# Patient Record
Sex: Male | Born: 1965 | Race: White | Hispanic: No | Marital: Single | Smoking: Current every day smoker
Health system: Southern US, Community
[De-identification: ages and names within clinical notes are randomized; demographics above are authoritative.]

## PROBLEM LIST (undated history)

## (undated) DIAGNOSIS — R7989 Other specified abnormal findings of blood chemistry: Secondary | ICD-10-CM

## (undated) DIAGNOSIS — F419 Anxiety disorder, unspecified: Secondary | ICD-10-CM

## (undated) DIAGNOSIS — R51 Headache: Secondary | ICD-10-CM

## (undated) DIAGNOSIS — E559 Vitamin D deficiency, unspecified: Secondary | ICD-10-CM

## (undated) DIAGNOSIS — E785 Hyperlipidemia, unspecified: Secondary | ICD-10-CM

## (undated) DIAGNOSIS — L309 Dermatitis, unspecified: Secondary | ICD-10-CM

## (undated) DIAGNOSIS — K5792 Diverticulitis of intestine, part unspecified, without perforation or abscess without bleeding: Secondary | ICD-10-CM

## (undated) HISTORY — PX: NOSE SURGERY: SHX723

## (undated) HISTORY — DX: Headache: R51

## (undated) HISTORY — DX: Vitamin D deficiency, unspecified: E55.9

## (undated) HISTORY — DX: Dermatitis, unspecified: L30.9

## (undated) HISTORY — DX: Hyperlipidemia, unspecified: E78.5

## (undated) HISTORY — DX: Other specified abnormal findings of blood chemistry: R79.89

---

## 2009-05-19 ENCOUNTER — Ambulatory Visit: Payer: Self-pay | Admitting: Diagnostic Radiology

## 2009-05-19 ENCOUNTER — Emergency Department (HOSPITAL_BASED_OUTPATIENT_CLINIC_OR_DEPARTMENT_OTHER): Admission: EM | Admit: 2009-05-19 | Discharge: 2009-05-19 | Payer: Self-pay | Admitting: Emergency Medicine

## 2011-01-16 LAB — BASIC METABOLIC PANEL
BUN: 12 mg/dL (ref 6–23)
CO2: 29 mEq/L (ref 19–32)
Calcium: 9.2 mg/dL (ref 8.4–10.5)
Chloride: 102 mEq/L (ref 96–112)
Creatinine, Ser: 0.9 mg/dL (ref 0.4–1.5)
GFR calc Af Amer: >60 mL/min (ref >60)
GFR calc non Af Amer: >60 mL/min (ref >60)
Glucose, Bld: 100 mg/dL — ABNORMAL HIGH (ref 70–99)
Potassium: 3.9 mEq/L (ref 3.5–5.1)
Sodium: 139 mEq/L (ref 135–145)

## 2011-01-16 LAB — CBC
Hemoglobin: 15.6 g/dL (ref 13.0–17.0)
MCHC: 35.3 g/dL (ref 30.0–36.0)
RBC: 4.78 MIL/uL (ref 4.22–5.81)
WBC: 7.5 10*3/uL (ref 4.0–10.5)

## 2011-02-24 ENCOUNTER — Other Ambulatory Visit: Payer: Self-pay | Admitting: Gastroenterology

## 2011-02-24 DIAGNOSIS — R1032 Left lower quadrant pain: Secondary | ICD-10-CM

## 2011-03-03 ENCOUNTER — Ambulatory Visit
Admission: RE | Admit: 2011-03-03 | Discharge: 2011-03-03 | Disposition: A | Discharge status: Home / Self Care | Payer: BC Managed Care – PPO | Source: Ambulatory Visit | Attending: Gastroenterology | Admitting: Gastroenterology

## 2011-03-03 DIAGNOSIS — R1032 Left lower quadrant pain: Secondary | ICD-10-CM

## 2011-03-03 MED ORDER — IOHEXOL 300 MG/ML  SOLN
100.0000 mL | Freq: Once | INTRAMUSCULAR | Status: AC | PRN
  Administered 2011-03-03: 125 mL via INTRAVENOUS

## 2014-10-21 ENCOUNTER — Other Ambulatory Visit: Payer: Self-pay | Admitting: Gastroenterology

## 2014-10-21 DIAGNOSIS — R1032 Left lower quadrant pain: Secondary | ICD-10-CM

## 2014-10-23 ENCOUNTER — Ambulatory Visit
Admission: RE | Admit: 2014-10-23 | Discharge: 2014-10-23 | Disposition: A | Discharge status: Home / Self Care | Payer: BLUE CROSS/BLUE SHIELD | Source: Ambulatory Visit | Attending: Gastroenterology | Admitting: Gastroenterology

## 2014-10-23 DIAGNOSIS — R1032 Left lower quadrant pain: Secondary | ICD-10-CM

## 2014-10-23 MED ORDER — IOHEXOL 300 MG/ML  SOLN
125.0000 mL | Freq: Once | INTRAMUSCULAR | Status: AC | PRN
  Administered 2014-10-23: 125 mL via INTRAVENOUS

## 2015-09-03 ENCOUNTER — Emergency Department (HOSPITAL_COMMUNITY): Payer: BLUE CROSS/BLUE SHIELD

## 2015-09-03 ENCOUNTER — Encounter (HOSPITAL_COMMUNITY): Payer: Self-pay

## 2015-09-03 ENCOUNTER — Inpatient Hospital Stay (HOSPITAL_COMMUNITY)
Admission: EM | Admit: 2015-09-03 | Discharge: 2015-09-05 | DRG: 392 | Disposition: A | Discharge status: Home / Self Care | Payer: BLUE CROSS/BLUE SHIELD | Attending: Internal Medicine | Admitting: Internal Medicine

## 2015-09-03 DIAGNOSIS — K5792 Diverticulitis of intestine, part unspecified, without perforation or abscess without bleeding: Secondary | ICD-10-CM | POA: Diagnosis not present

## 2015-09-03 DIAGNOSIS — F329 Major depressive disorder, single episode, unspecified: Secondary | ICD-10-CM

## 2015-09-03 DIAGNOSIS — F32A Depression, unspecified: Secondary | ICD-10-CM | POA: Diagnosis present

## 2015-09-03 DIAGNOSIS — K5720 Diverticulitis of large intestine with perforation and abscess without bleeding: Principal | ICD-10-CM

## 2015-09-03 DIAGNOSIS — G4700 Insomnia, unspecified: Secondary | ICD-10-CM | POA: Diagnosis present

## 2015-09-03 DIAGNOSIS — K5732 Diverticulitis of large intestine without perforation or abscess without bleeding: Secondary | ICD-10-CM | POA: Diagnosis present

## 2015-09-03 DIAGNOSIS — Z720 Tobacco use: Secondary | ICD-10-CM | POA: Diagnosis present

## 2015-09-03 DIAGNOSIS — F17200 Nicotine dependence, unspecified, uncomplicated: Secondary | ICD-10-CM | POA: Diagnosis present

## 2015-09-03 DIAGNOSIS — D72829 Elevated white blood cell count, unspecified: Secondary | ICD-10-CM | POA: Diagnosis present

## 2015-09-03 DIAGNOSIS — R1032 Left lower quadrant pain: Secondary | ICD-10-CM | POA: Diagnosis not present

## 2015-09-03 HISTORY — DX: Diverticulitis of intestine, part unspecified, without perforation or abscess without bleeding: K57.92

## 2015-09-03 LAB — CBC WITH DIFFERENTIAL/PLATELET
Basophils Absolute: 0 10*3/uL (ref 0.0–0.1)
Basophils Relative: 0 %
EOS PCT: 1 %
Eosinophils Absolute: 0.1 10*3/uL (ref 0.0–0.7)
HEMATOCRIT: 41.4 % (ref 39.0–52.0)
HEMOGLOBIN: 14.8 g/dL (ref 13.0–17.0)
LYMPHS ABS: 2.6 10*3/uL (ref 0.7–4.0)
LYMPHS PCT: 15 %
MCH: 32.6 pg (ref 26.0–34.0)
MCHC: 35.7 g/dL (ref 30.0–36.0)
MCV: 91.2 fL (ref 78.0–100.0)
Monocytes Absolute: 2 10*3/uL — ABNORMAL HIGH (ref 0.1–1.0)
Monocytes Relative: 12 %
NEUTROS ABS: 12.1 10*3/uL — AB (ref 1.7–7.7)
NEUTROS PCT: 72 %
Platelets: 323 10*3/uL (ref 150–400)
RBC: 4.54 MIL/uL (ref 4.22–5.81)
RDW: 12.2 % (ref 11.5–15.5)
WBC: 16.8 10*3/uL — AB (ref 4.0–10.5)

## 2015-09-03 LAB — COMPREHENSIVE METABOLIC PANEL
ALK PHOS: 42 U/L (ref 38–126)
ALT: 25 U/L (ref 17–63)
AST: 20 U/L (ref 15–41)
Albumin: 4.3 g/dL (ref 3.5–5.0)
Anion gap: 8 (ref 5–15)
BUN: 10 mg/dL (ref 6–20)
CALCIUM: 9.1 mg/dL (ref 8.9–10.3)
CO2: 26 mmol/L (ref 22–32)
CREATININE: 1.01 mg/dL (ref 0.61–1.24)
Chloride: 103 mmol/L (ref 101–111)
Glucose, Bld: 89 mg/dL (ref 65–99)
Potassium: 3.5 mmol/L (ref 3.5–5.1)
Sodium: 137 mmol/L (ref 135–145)
Total Bilirubin: 1.1 mg/dL (ref 0.3–1.2)
Total Protein: 7.7 g/dL (ref 6.5–8.1)

## 2015-09-03 LAB — URINALYSIS, ROUTINE W REFLEX MICROSCOPIC
BILIRUBIN URINE: NEGATIVE
Glucose, UA: NEGATIVE mg/dL
HGB URINE DIPSTICK: NEGATIVE
KETONES UR: NEGATIVE mg/dL
Leukocytes, UA: NEGATIVE
NITRITE: NEGATIVE
PH: 6 (ref 5.0–8.0)
Protein, ur: NEGATIVE mg/dL
Specific Gravity, Urine: 1.02 (ref 1.005–1.030)

## 2015-09-03 LAB — LACTIC ACID, PLASMA
LACTIC ACID, VENOUS: 1.2 mmol/L (ref 0.5–2.0)
Lactic Acid, Venous: 0.9 mmol/L (ref 0.5–2.0)

## 2015-09-03 LAB — LIPASE, BLOOD: LIPASE: 31 U/L (ref 11–51)

## 2015-09-03 MED ORDER — METRONIDAZOLE IN NACL 5-0.79 MG/ML-% IV SOLN
500.0000 mg | Freq: Three times a day (TID) | INTRAVENOUS | Status: DC
  Administered 2015-09-04 – 2015-09-05 (×5): 500 mg via INTRAVENOUS
  Filled 2015-09-03 (×5): qty 100

## 2015-09-03 MED ORDER — HEPARIN SODIUM (PORCINE) 5000 UNIT/ML IJ SOLN
5000.0000 [IU] | Freq: Three times a day (TID) | INTRAMUSCULAR | Status: DC
  Administered 2015-09-04: 5000 [IU] via SUBCUTANEOUS
  Filled 2015-09-03: qty 1

## 2015-09-03 MED ORDER — IOHEXOL 300 MG/ML  SOLN
100.0000 mL | Freq: Once | INTRAMUSCULAR | Status: AC | PRN
  Administered 2015-09-03: 50 mL via INTRAVENOUS

## 2015-09-03 MED ORDER — CITALOPRAM HYDROBROMIDE 20 MG PO TABS
40.0000 mg | ORAL_TABLET | Freq: Every day | ORAL | Status: DC
  Administered 2015-09-04 – 2015-09-05 (×2): 40 mg via ORAL
  Filled 2015-09-03 (×2): qty 2

## 2015-09-03 MED ORDER — HYDROMORPHONE HCL 1 MG/ML IJ SOLN
1.0000 mg | INTRAMUSCULAR | Status: DC | PRN
  Administered 2015-09-03: 1 mg via INTRAVENOUS
  Filled 2015-09-03: qty 1

## 2015-09-03 MED ORDER — SODIUM CHLORIDE 0.9 % IV SOLN
INTRAVENOUS | Status: DC
  Administered 2015-09-03: 16:00:00 via INTRAVENOUS

## 2015-09-03 MED ORDER — METRONIDAZOLE IN NACL 5-0.79 MG/ML-% IV SOLN
500.0000 mg | Freq: Once | INTRAVENOUS | Status: AC
  Administered 2015-09-03: 500 mg via INTRAVENOUS
  Filled 2015-09-03: qty 100

## 2015-09-03 MED ORDER — ONDANSETRON HCL 4 MG PO TABS: 4.0000 mg | ORAL_TABLET | Freq: Four times a day (QID) | ORAL | Status: DC | PRN

## 2015-09-03 MED ORDER — FENOFIBRATE 160 MG PO TABS
160.0000 mg | ORAL_TABLET | Freq: Every day | ORAL | Status: DC
  Administered 2015-09-04 – 2015-09-05 (×2): 160 mg via ORAL
  Filled 2015-09-03 (×2): qty 1

## 2015-09-03 MED ORDER — BARIUM SULFATE 2.1 % PO SUSP
ORAL | Status: AC
  Filled 2015-09-03: qty 1

## 2015-09-03 MED ORDER — CIPROFLOXACIN IN D5W 400 MG/200ML IV SOLN
400.0000 mg | Freq: Two times a day (BID) | INTRAVENOUS | Status: DC
  Administered 2015-09-04 – 2015-09-05 (×3): 400 mg via INTRAVENOUS
  Filled 2015-09-03 (×3): qty 200

## 2015-09-03 MED ORDER — ZOLPIDEM TARTRATE 5 MG PO TABS
5.0000 mg | ORAL_TABLET | Freq: Every evening | ORAL | Status: DC | PRN
  Administered 2015-09-03 – 2015-09-04 (×2): 5 mg via ORAL
  Filled 2015-09-03 (×2): qty 1

## 2015-09-03 MED ORDER — GABAPENTIN 300 MG PO CAPS
300.0000 mg | ORAL_CAPSULE | Freq: Every day | ORAL | Status: DC
  Administered 2015-09-03 – 2015-09-04 (×2): 300 mg via ORAL
  Filled 2015-09-03 (×2): qty 1

## 2015-09-03 MED ORDER — MORPHINE SULFATE (PF) 4 MG/ML IV SOLN
4.0000 mg | INTRAVENOUS | Status: AC | PRN
  Administered 2015-09-03 (×2): 4 mg via INTRAVENOUS
  Filled 2015-09-03 (×2): qty 1

## 2015-09-03 MED ORDER — IOHEXOL 300 MG/ML  SOLN
50.0000 mL | Freq: Once | INTRAMUSCULAR | Status: AC | PRN
  Administered 2015-09-03: 50 mL via INTRAVENOUS

## 2015-09-03 MED ORDER — ONDANSETRON HCL 4 MG/2ML IJ SOLN
4.0000 mg | INTRAMUSCULAR | Status: DC | PRN
Start: 2015-09-03 — End: 2015-09-03
  Administered 2015-09-03: 4 mg via INTRAVENOUS
  Filled 2015-09-03: qty 2

## 2015-09-03 MED ORDER — CIPROFLOXACIN IN D5W 400 MG/200ML IV SOLN
400.0000 mg | Freq: Once | INTRAVENOUS | Status: DC
  Filled 2015-09-03: qty 200

## 2015-09-03 MED ORDER — KCL IN DEXTROSE-NACL 20-5-0.9 MEQ/L-%-% IV SOLN
INTRAVENOUS | Status: DC
  Administered 2015-09-03: 20:00:00 via INTRAVENOUS
  Administered 2015-09-04 (×2): 1 mL via INTRAVENOUS

## 2015-09-03 MED ORDER — ALBUTEROL SULFATE (2.5 MG/3ML) 0.083% IN NEBU: 3.0000 mL | INHALATION_SOLUTION | RESPIRATORY_TRACT | Status: DC | PRN

## 2015-09-03 MED ORDER — HYDROMORPHONE HCL 1 MG/ML IJ SOLN
1.0000 mg | INTRAMUSCULAR | Status: DC | PRN
  Administered 2015-09-03 – 2015-09-04 (×7): 1 mg via INTRAVENOUS
  Filled 2015-09-03 (×8): qty 1

## 2015-09-03 MED ORDER — ONDANSETRON HCL 4 MG/2ML IJ SOLN
4.0000 mg | Freq: Four times a day (QID) | INTRAMUSCULAR | Status: DC | PRN
  Administered 2015-09-04: 4 mg via INTRAVENOUS
  Filled 2015-09-03: qty 2

## 2015-09-03 MED ORDER — CIPROFLOXACIN IN D5W 400 MG/200ML IV SOLN: 400.0000 mg | Freq: Two times a day (BID) | INTRAVENOUS | Status: DC

## 2015-09-03 MED ORDER — ALPRAZOLAM 0.5 MG PO TABS: 0.5000 mg | ORAL_TABLET | Freq: Two times a day (BID) | ORAL | Status: DC | PRN

## 2015-09-03 NOTE — ED Provider Notes (Signed)
CSN: [id number]     Arrival date & time 09/03/15  1438 History   First MD Initiated Contact with Patient 09/03/15 1455     Chief Complaint  Patient presents with  . Abdominal Pain      HPI  Pt was seen at 1505.  Per pt, c/o gradual onset and worsening of persistent LLQ abd "pain" since yesterday.  Has been associated with multiple intermittent episodes of N/V as well as having one episode of "blood in my stool" 2 days ago.  Describes the abd pain as "like the last time I had diverticulitis."  Denies diarrhea, no fevers, no back pain, no rash, no CP/SOB, no black or blood in stools or emesis, no rash.      Past Medical History  Diagnosis Date  . Diverticulitis    Past Surgical History  Procedure Laterality Date  . Nose surgery      Social History  Substance Use Topics  . Smoking status: Current Every Day Smoker  . Smokeless tobacco: None  . Alcohol Use: Yes     Comment: occ    Review of Systems ROS: Statement: All systems negative except as marked or noted in the HPI; Constitutional: Negative for fever and chills. ; ; Eyes: Negative for eye pain, redness and discharge. ; ; ENMT: Negative for ear pain, hoarseness, nasal congestion, sinus pressure and sore throat. ; ; Cardiovascular: Negative for chest pain, palpitations, diaphoresis, dyspnea and peripheral edema. ; ; Respiratory: Negative for cough, wheezing and stridor. ; ; Gastrointestinal: +N/V, abd pain, blood in stool. Negative for diarrhea, hematemesis, jaundice. . ; ; Genitourinary: Negative for dysuria, flank pain and hematuria. ; ; Musculoskeletal: Negative for back pain and neck pain. Negative for swelling and trauma.; ; Skin: Negative for pruritus, rash, abrasions, blisters, bruising and skin lesion.; ; Neuro: Negative for headache, lightheadedness and neck stiffness. Negative for weakness, altered level of consciousness , altered mental status, extremity weakness, paresthesias, involuntary movement, seizure and syncope.       Allergies  Review of patient's allergies indicates no known allergies.  Home Medications   Prior to Admission medications   Not on File   BP 111/81 mmHg  Pulse 97  Temp(Src) 99 F (37.2 C) (Oral)  Resp 18  Ht 6' (1.829 m)  Wt 220 lb (99.791 kg)  BMI 29.83 kg/m2  SpO2 99% Physical Exam  1510: Physical examination:  Nursing notes reviewed; Vital signs and O2 SAT reviewed;  Constitutional: Well developed, Well nourished, Well hydrated, Uncomfortable appearing.; Head:  Normocephalic, atraumatic; Eyes: EOMI, PERRL, No scleral icterus; ENMT: Mouth and pharynx normal, Mucous membranes moist; Neck: Supple, Full range of motion, No lymphadenopathy; Cardiovascular: Regular rate and rhythm, No gallop; Respiratory: Breath sounds clear & equal bilaterally, No wheezes.  Speaking full sentences with ease, Normal respiratory effort/excursion; Chest: Nontender, Movement normal; Abdomen: Soft, +LLQ tenderness to palp. Nondistended, Normal bowel sounds. Rectal exam performed w/permission of pt and ED RN chaperone present.  Anal tone normal.  Non-tender, soft brown stool in rectal vault, heme neg.  No fissures, no external hemorrhoids, no palp masses.; Genitourinary: No CVA tenderness; Extremities: Pulses normal, No tenderness, No edema, No calf edema or asymmetry.; Neuro: AA&Ox3, Major CN grossly intact.  Speech clear. No gross focal motor or sensory deficits in extremities.; Skin: Color normal, Warm, Dry.   ED Course  Procedures (including critical care time) Labs Review   Imaging Review  I have personally reviewed and evaluated these images and lab results as part of  my medical decision-making.   EKG Interpretation None      MDM  MDM Reviewed: previous chart, nursing note and vitals Reviewed previous: labs Interpretation: labs and CT scan     Results for orders placed or performed during the hospital encounter of 09/03/15  CBC with Differential  Result Value Ref Range   WBC 16.8  (H) 4.0 - 10.5 K/uL   RBC 4.54 4.22 - 5.81 MIL/uL   Hemoglobin 14.8 13.0 - 17.0 g/dL   HCT 41.4 39.0 - 52.0 %   MCV 91.2 78.0 - 100.0 fL   MCH 32.6 26.0 - 34.0 pg   MCHC 35.7 30.0 - 36.0 g/dL   RDW 12.2 11.5 - 15.5 %   Platelets 323 150 - 400 K/uL   Neutrophils Relative % 72 %   Neutro Abs 12.1 (H) 1.7 - 7.7 K/uL   Lymphocytes Relative 15 %   Lymphs Abs 2.6 0.7 - 4.0 K/uL   Monocytes Relative 12 %   Monocytes Absolute 2.0 (H) 0.1 - 1.0 K/uL   Eosinophils Relative 1 %   Eosinophils Absolute 0.1 0.0 - 0.7 K/uL   Basophils Relative 0 %   Basophils Absolute 0.0 0.0 - 0.1 K/uL  Comprehensive metabolic panel  Result Value Ref Range   Sodium 137 135 - 145 mmol/L   Potassium 3.5 3.5 - 5.1 mmol/L   Chloride 103 101 - 111 mmol/L   CO2 26 22 - 32 mmol/L   Glucose, Bld 89 65 - 99 mg/dL   BUN 10 6 - 20 mg/dL   Creatinine, Ser 1.01 0.61 - 1.24 mg/dL   Calcium 9.1 8.9 - 10.3 mg/dL   Total Protein 7.7 6.5 - 8.1 g/dL   Albumin 4.3 3.5 - 5.0 g/dL   AST 20 15 - 41 U/L   ALT 25 17 - 63 U/L   Alkaline Phosphatase 42 38 - 126 U/L   Total Bilirubin 1.1 0.3 - 1.2 mg/dL   GFR calc non Af Amer >60 >60 mL/min   GFR calc Af Amer >60 >60 mL/min   Anion gap 8 5 - 15  Urinalysis, Routine w reflex microscopic (not at [facility])  Result Value Ref Range   Color, Urine YELLOW YELLOW   APPearance CLEAR CLEAR   Specific Gravity, Urine 1.020 1.005 - 1.030   pH 6.0 5.0 - 8.0   Glucose, UA NEGATIVE NEGATIVE mg/dL   Hgb urine dipstick NEGATIVE NEGATIVE   Bilirubin Urine NEGATIVE NEGATIVE   Ketones, ur NEGATIVE NEGATIVE mg/dL   Protein, ur NEGATIVE NEGATIVE mg/dL   Nitrite NEGATIVE NEGATIVE   Leukocytes, UA NEGATIVE NEGATIVE  Lipase, blood  Result Value Ref Range   Lipase 31 11 - 51 U/L  Lactic acid, plasma  Result Value Ref Range   Lactic Acid, Venous 1.2 0.5 - 2.0 mmol/L   Dg Chest 2 View 09/03/2015  CLINICAL DATA:  Patient with nausea, vomiting and fever. Left lower quadrant abdominal pain. EXAM:  CHEST  2 VIEW COMPARISON:  None. FINDINGS: Cardiac contours upper limits of normal. No consolidative pulmonary opacities. No pleural effusion or pneumothorax. Regional skeleton is unremarkable. IMPRESSION: No acute cardiopulmonary process. Electronically Signed   By: Lovey Newcomer M.D.   On: 09/03/2015 16:26   Ct Abdomen Pelvis W Contrast 09/03/2015  CLINICAL DATA:  Left lower quadrant pain with nausea and vomiting for 1 day EXAM: CT ABDOMEN AND PELVIS WITH CONTRAST TECHNIQUE: Multidetector CT imaging of the abdomen and pelvis was performed using the standard protocol following bolus administration  of intravenous contrast. Oral contrast was also administered. CONTRAST:  13mL OMNIPAQUE IOHEXOL 300 MG/ML  SOLN COMPARISON:  October 23, 2014 FINDINGS: Lower chest: There is slight bibasilar lung atelectasis. Lung bases are otherwise clear. Hepatobiliary: The liver is prominent, measuring 20.9 cm in length. No focal liver lesions are identified. Gallbladder wall is not appreciably thickened. There is no biliary duct dilatation. Pancreas: No pancreatic mass or inflammatory focus. Spleen: No splenic lesions are identified. Adrenals/Urinary Tract: Adrenals appear normal bilaterally. Kidneys bilaterally show no mass or hydronephrosis on either side. There is no renal or ureteral calculus on either side. Urinary bladder is midline with wall thickness within normal limits. Stomach/Bowel: There is wall thickening throughout the proximal and mid sigmoid colon with extensive surrounding mesenteric thickening and wall thickening consistent with diverticulitis. There is a 1.0 x 0.7 cm focus of decreased attenuation within the area of diverticulitis in the proximal sigmoid colon, likely a tiny diverticular abscess. No evidence of perforation in the area of diverticulitis. Elsewhere, there is no appreciable bowel wall or mesenteric thickening. No bowel obstruction. No free air or portal venous air. Vascular/Lymphatic: There is no  abdominal aortic aneurysm. Major mesenteric vessels appear patent. There are scattered retroperitoneal and inguinal lymph nodes which do not meet size criteria for pathologic significance. By size criteria, there is no demonstrable adenopathy in the abdomen or pelvis. Reproductive: Prostate and seminal vesicles are normal in size and contour. Other: Appendix appears normal. No ascites. No abscess is seen outside of the area of diverticulitis. Musculoskeletal: There are no blastic or lytic bone lesions. No intramuscular or abdominal wall lesion. IMPRESSION: Extensive sigmoid diverticulitis. 1.0 x 0.7 cm focus of decreased attenuation noted with in the area of diverticulitis in the proximal sigmoid colon, probably an early small diverticular abscess. No other evidence suggesting abscess. No evidence of perforation. No bowel obstruction.  Appendix appears normal. No renal or ureteral calculus.  No hydronephrosis. Prominent liver without focal liver lesion. Electronically Signed   By: Lowella Grip III M.D.   On: 09/03/2015 16:41    1705:  IV abx started for acute diverticulitis. Dx and testing d/w pt.  Questions answered.  Verb understanding, agreeable to admit. T/C to [facility] [place] Comment, case discussed, including:  HPI, pertinent PM/SHx, VS/PE, dx testing, ED course and treatment:  Agreeable to admit, requests to write temporary orders, obtain medical bed to team APAdmits.   Francine Graven, DO 09/04/15 2129

## 2015-09-03 NOTE — ED Notes (Signed)
Pt reports left lower quad pain with n/v since Wednesday.  Reports noticed bright red blood in stool Tuesday.  Reports history of diverticulitis.  Pt says had a soft bm this morning and did not appear to be black or bloody.  Pt says called his doctor this morning to see if he could get some antibiotics but he instructed  pt to come to er for eval.

## 2015-09-03 NOTE — H&P (Signed)
[facility] History and Physical  Devin Ryan [id number] DOB: 05/13/1966    PCP:   No primary care provider on file.   Chief Complaint: abdominal pain.   HPI: Devin Ryan is an 49 y.o. male with hx of several bouts of diverticulitis in the past, last coloncoscopy about 5 years ago, hx of depression and anxiety, tobacco abuse, but no CAD, HTN, or DM, presents to the ER with LLQ pain for at least a week, and intermittent fever for a couple of weeks.  Work up in the ER showed leukocytosis with WBC of 16.7K, and normal Hb, renal Fx tests and hepatic Fx tests.  An abdominal pelvic CT showed large amount of diverticultis, with one possible diverticular abscess, but no perforations.   His CXR was clear.  He was given IV Cipro and IV Flagyl, and hospitlist was asked to admit him for recurrent diverticulitis.   Rewiew of Systems:  Constitutional: Negative for malaise, fever and chills. No significant weight loss or weight gain Eyes: Negative for eye pain, redness and discharge, diplopia, visual changes, or flashes of light. ENMT: Negative for ear pain, hoarseness, nasal congestion, sinus pressure and sore throat. No headaches; tinnitus, drooling, or problem swallowing. Cardiovascular: Negative for chest pain, palpitations, diaphoresis, dyspnea and peripheral edema. ; No orthopnea, PND Respiratory: Negative for cough, hemoptysis, wheezing and stridor. No pleuritic chestpain. Gastrointestinal: Negative for nausea, vomiting, diarrhea, constipation,melena, blood in stool, hematemesis, jaundice and rectal bleeding.    Genitourinary: Negative for frequency, dysuria, incontinence,flank pain and hematuria; Musculoskeletal: Negative for back pain and neck pain. Negative for swelling and trauma.;  Skin: . Negative for pruritus, rash, abrasions, bruising and skin lesion.; ulcerations Neuro: Negative for headache, lightheadedness and neck stiffness. Negative for weakness, altered level of  consciousness , altered mental status, extremity weakness, burning feet, involuntary movement, seizure and syncope.  Psych: negative for anxiety, depression, insomnia, tearfulness, panic attacks, hallucinations, paranoia, suicidal or homicidal ideation    Past Medical History  Diagnosis Date  . Diverticulitis     Past Surgical History  Procedure Laterality Date  . Nose surgery      Medications:  HOME MEDS: Prior to Admission medications   Medication Sig Start Date End Date Taking? Authorizing Provider  ALPRAZolam Duanne Moron) 0.5 MG tablet Take 0.5 mg by mouth 2 (two) times daily as needed. anxiety 06/04/15  Yes Historical Provider, MD  citalopram (CELEXA) 40 MG tablet Take 40 mg by mouth daily. 08/10/15  Yes Historical Provider, MD  Eszopiclone 3 MG TABS Take 3 mg by mouth at bedtime. 08/10/15  Yes Historical Provider, MD  fenofibrate micronized (LOFIBRA) 134 MG capsule Take 134 mg by mouth daily. 08/10/15  Yes Historical Provider, MD  gabapentin (NEURONTIN) 300 MG capsule Take 300 mg by mouth at bedtime. 08/20/15  Yes Historical Provider, MD  LORazepam (ATIVAN) 0.5 MG tablet Take 0.5 mg by mouth 2 (two) times daily as needed. anxiety 07/07/15  Yes Historical Provider, MD  PROAIR HFA 108 (90 BASE) MCG/ACT inhaler Inhale 1 puff into the lungs every 6 (six) hours as needed. Shortness of breath 08/17/15   Historical Provider, MD     Allergies:  No Known Allergies  Social History:   reports that he has been smoking.  He does not have any smokeless tobacco history on file. He reports that he drinks alcohol. He reports that he does not use illicit drugs.  Family History: No family history on file.   Physical Exam: Filed Vitals:   09/03/15 1448  09/03/15 1744  BP: 111/81 121/68  Pulse: 97 87  Temp: 99 F (37.2 C) 99.1 F (37.3 C)  TempSrc: Oral Oral  Resp: 18 18  Height: 6' (1.829 m)   Weight: 99.791 kg (220 lb)   SpO2: 99% 95%   Blood pressure 121/68, pulse 87, temperature 99.1  F (37.3 C), temperature source Oral, resp. rate 18, height 6' (1.829 m), weight 99.791 kg (220 lb), SpO2 95 %.  GEN:  Pleasant  patient lying in the stretcher in no acute distress; cooperative with exam. PSYCH:  alert and oriented x4; does not appear anxious or depressed; affect is appropriate. HEENT: Mucous membranes pink and anicteric; PERRLA; EOM intact; no cervical lymphadenopathy nor thyromegaly or carotid bruit; no JVD; There were no stridor. Neck is very supple. Breasts:: Not examined CHEST WALL: No tenderness CHEST: Normal respiration, clear to auscultation bilaterally.  HEART: Regular rate and rhythm.  There are no murmur, rub, or gallops.   BACK: No kyphosis or scoliosis; no CVA tenderness ABDOMEN: soft and tender over the LLQ area,  no masses, no organomegaly, normal abdominal bowel sounds; no pannus; no intertriginous candida. There is no rebound and no distention.  He has bowel sounds.  Rectal Exam: Not done EXTREMITIES: No bone or joint deformity; age-appropriate arthropathy of the hands and knees; no edema; no ulcerations.  There is no calf tenderness. Genitalia: not examined PULSES: 2+ and symmetric SKIN: Normal hydration no rash or ulceration CNS: Cranial nerves 2-12 grossly intact no focal lateralizing neurologic deficit.  Speech is fluent; uvula elevated with phonation, facial symmetry and tongue midline. DTR are normal bilaterally, cerebella exam is intact, barbinski is negative and strengths are equaled bilaterally.  No sensory loss.   Labs on Admission:  Basic Metabolic Panel:  Recent Labs Lab 09/03/15 1515  NA 137  K 3.5  CL 103  CO2 26  GLUCOSE 89  BUN 10  CREATININE 1.01  CALCIUM 9.1   Liver Function Tests:  Recent Labs Lab 09/03/15 1515  AST 20  ALT 25  ALKPHOS 42  BILITOT 1.1  PROT 7.7  ALBUMIN 4.3    Recent Labs Lab 09/03/15 1515  LIPASE 31   CBC:  Recent Labs Lab 09/03/15 1515  WBC 16.8*  NEUTROABS 12.1*  HGB 14.8  HCT 41.4   MCV 91.2  PLT 323   Radiological Exams on Admission: Dg Chest 2 View  09/03/2015  CLINICAL DATA:  Patient with nausea, vomiting and fever. Left lower quadrant abdominal pain. EXAM: CHEST  2 VIEW COMPARISON:  None. FINDINGS: Cardiac contours upper limits of normal. No consolidative pulmonary opacities. No pleural effusion or pneumothorax. Regional skeleton is unremarkable. IMPRESSION: No acute cardiopulmonary process. Electronically Signed   By: Lovey Newcomer M.D.   On: 09/03/2015 16:26   Ct Abdomen Pelvis W Contrast  09/03/2015  CLINICAL DATA:  Left lower quadrant pain with nausea and vomiting for 1 day EXAM: CT ABDOMEN AND PELVIS WITH CONTRAST TECHNIQUE: Multidetector CT imaging of the abdomen and pelvis was performed using the standard protocol following bolus administration of intravenous contrast. Oral contrast was also administered. CONTRAST:  45mL OMNIPAQUE IOHEXOL 300 MG/ML  SOLN COMPARISON:  October 23, 2014 FINDINGS: Lower chest: There is slight bibasilar lung atelectasis. Lung bases are otherwise clear. Hepatobiliary: The liver is prominent, measuring 20.9 cm in length. No focal liver lesions are identified. Gallbladder wall is not appreciably thickened. There is no biliary duct dilatation. Pancreas: No pancreatic mass or inflammatory focus. Spleen: No splenic lesions are identified.  Adrenals/Urinary Tract: Adrenals appear normal bilaterally. Kidneys bilaterally show no mass or hydronephrosis on either side. There is no renal or ureteral calculus on either side. Urinary bladder is midline with wall thickness within normal limits. Stomach/Bowel: There is wall thickening throughout the proximal and mid sigmoid colon with extensive surrounding mesenteric thickening and wall thickening consistent with diverticulitis. There is a 1.0 x 0.7 cm focus of decreased attenuation within the area of diverticulitis in the proximal sigmoid colon, likely a tiny diverticular abscess. No evidence of perforation in  the area of diverticulitis. Elsewhere, there is no appreciable bowel wall or mesenteric thickening. No bowel obstruction. No free air or portal venous air. Vascular/Lymphatic: There is no abdominal aortic aneurysm. Major mesenteric vessels appear patent. There are scattered retroperitoneal and inguinal lymph nodes which do not meet size criteria for pathologic significance. By size criteria, there is no demonstrable adenopathy in the abdomen or pelvis. Reproductive: Prostate and seminal vesicles are normal in size and contour. Other: Appendix appears normal. No ascites. No abscess is seen outside of the area of diverticulitis. Musculoskeletal: There are no blastic or lytic bone lesions. No intramuscular or abdominal wall lesion. IMPRESSION: Extensive sigmoid diverticulitis. 1.0 x 0.7 cm focus of decreased attenuation noted with in the area of diverticulitis in the proximal sigmoid colon, probably an early small diverticular abscess. No other evidence suggesting abscess. No evidence of perforation. No bowel obstruction.  Appendix appears normal. No renal or ureteral calculus.  No hydronephrosis. Prominent liver without focal liver lesion. Electronically Signed   By: Lowella Grip III M.D.   On: 09/03/2015 16:41    EKG: Independently reviewed.    Assessment/Plan Present on Admission:  . Acute diverticulitis . Diverticulitis large intestine . Tobacco abuse . Depression . Insomnia  PLAN:  Will admit him for acute diverticulitis, made NPO, give IVF and analgesics.  He will receive IV Cipro and IV Flagyl. Will consult surgery to follow along, and that he will need follow up hemicolectomy.  He probably needs a follow up colonoscopy as well.  I will continue his home meds.  He is stable, full code, and will be admitted to [facility] service.  Thank you and Good Day.   Other plans as per orders.  Code Status: FULL Haskel Khan, MD. [facility] Pager [phone] 7pm to 7am.  09/03/2015, 5:54  PM

## 2015-09-03 NOTE — Progress Notes (Signed)
ANTIBIOTIC CONSULT NOTE - INITIAL  Pharmacy Consult for Cipro Indication: intra-abdominal infection  No Known Allergies  Patient Measurements: Height: 6' (182.9 cm) Weight: 220 lb (99.791 kg) IBW/kg (Calculated) : 77.6  Vital Signs: Temp: 99 F (37.2 C) (11/24 1448) Temp Source: Oral (11/24 1448) BP: 111/81 mmHg (11/24 1448) Pulse Rate: 97 (11/24 1448) Intake/Output from previous day:   Intake/Output from this shift:    Labs:  Recent Labs  09/03/15 1515  WBC 16.8*  HGB 14.8  PLT 323  CREATININE 1.01   Estimated Creatinine Clearance: 110.6 mL/min (by C-G formula based on Cr of 1.01). No results for input(s): VANCOTROUGH, VANCOPEAK, VANCORANDOM, GENTTROUGH, GENTPEAK, GENTRANDOM, TOBRATROUGH, TOBRAPEAK, TOBRARND, AMIKACINPEAK, AMIKACINTROU, AMIKACIN in the last 72 hours.   Microbiology: No results found for this or any previous visit (from the past 720 hour(s)).  Medical History: Past Medical History  Diagnosis Date  . Diverticulitis    Assessment: Pt reports left lower quad pain with n/v since Wednesday. Reports noticed bright red blood in stool Tuesday. Reports history of diverticulitis. Pt says had a soft bm this morning and did not appear to be black or bloody. Pt says called his doctor this morning to see if he could get some antibiotics but he instructed pt to come to er for eval. Started on Flagyl and Cipro.  Estimated Creatinine Clearance: 110.6 mL/min (by C-G formula based on Cr of 1.01).  Goal of Therapy:  Eradicate infection.  Plan:  Cipro 400mg  IV q12hrs Monitor labs, progress, and c/s  [place] Crane, Kayton Dunaj A 09/03/2015,5:18 PM

## 2015-09-04 LAB — COMPREHENSIVE METABOLIC PANEL
ALK PHOS: 38 U/L (ref 38–126)
ALT: 22 U/L (ref 17–63)
AST: 19 U/L (ref 15–41)
Albumin: 3.8 g/dL (ref 3.5–5.0)
Anion gap: 6 (ref 5–15)
BUN: 10 mg/dL (ref 6–20)
CALCIUM: 8.8 mg/dL — AB (ref 8.9–10.3)
CHLORIDE: 102 mmol/L (ref 101–111)
CO2: 29 mmol/L (ref 22–32)
CREATININE: 0.95 mg/dL (ref 0.61–1.24)
Glucose, Bld: 123 mg/dL — ABNORMAL HIGH (ref 65–99)
Potassium: 4 mmol/L (ref 3.5–5.1)
Sodium: 137 mmol/L (ref 135–145)
Total Bilirubin: 1.1 mg/dL (ref 0.3–1.2)
Total Protein: 7.1 g/dL (ref 6.5–8.1)

## 2015-09-04 LAB — CBC
HCT: 37.9 % — ABNORMAL LOW (ref 39.0–52.0)
HEMOGLOBIN: 13.1 g/dL (ref 13.0–17.0)
MCH: 32 pg (ref 26.0–34.0)
MCHC: 34.6 g/dL (ref 30.0–36.0)
MCV: 92.7 fL (ref 78.0–100.0)
PLATELETS: 293 10*3/uL (ref 150–400)
RBC: 4.09 MIL/uL — AB (ref 4.22–5.81)
RDW: 12.3 % (ref 11.5–15.5)
WBC: 12.3 10*3/uL — AB (ref 4.0–10.5)

## 2015-09-04 MED ORDER — ENOXAPARIN SODIUM 40 MG/0.4ML ~~LOC~~ SOLN
40.0000 mg | SUBCUTANEOUS | Status: DC
  Administered 2015-09-04 – 2015-09-05 (×2): 40 mg via SUBCUTANEOUS
  Filled 2015-09-04 (×2): qty 0.4

## 2015-09-04 MED ORDER — ALBUTEROL SULFATE (2.5 MG/3ML) 0.083% IN NEBU
3.0000 mL | INHALATION_SOLUTION | Freq: Four times a day (QID) | RESPIRATORY_TRACT | Status: DC | PRN
Start: 2015-09-04 — End: 2015-09-05

## 2015-09-04 NOTE — Care Management Note (Signed)
Case Management Note  Patient Details  Name: Devin Ryan MRN: [id number] Date of Birth: Aug 10, 1966  Subjective/Objective:                  Pt admitted from home with diverticulitis. Pt lives alone and will return home at discharge. Pt is independent with ADL's.  Action/Plan: No Cm needs anticipated.  Expected Discharge Date:                  Expected Discharge Plan:  Home/Self Care  In-House Referral:  NA  Discharge planning Services  CM Consult  Post Acute Care Choice:  NA Choice offered to:  NA  DME Arranged:    DME Agency:     HH Arranged:    HH Agency:     Status of Service:  Completed, signed off  Medicare Important Message Given:    Date Medicare IM Given:    Medicare IM give by:    Date Additional Medicare IM Given:    Additional Medicare Important Message give by:     If discussed at [place] of Stay Meetings, dates discussed:    Additional Comments:  Joylene Draft, RN 09/04/2015, 11:33 AM

## 2015-09-04 NOTE — Progress Notes (Deleted)
Complained of nausea, Zofran PO given.  Patient reported some relief with cola

## 2015-09-04 NOTE — Consult Note (Signed)
Reason for Consult: Sigmoid diverticulitis Referring Physician: Hospitalist  Devin Ryan is an 49 y.o. male.  HPI: Patient is a 49 year old white male who presented to the emergency room with worsening left lower quadrant abdominal pain and generalized malaise. He was found and CT scan of the abdomen to have sigmoid diverticulitis. There was no frank abscess cavity present. No pneumoperitoneum is noted. He was admitted to the hospital for further evaluation treatment. He states he does feel somewhat better, but feels full. He was having some hematochezia just prior to admission. He has been followed by gastroenterology in [place]. He states he had a colonoscopy in 2012. He has had numerous episodes of sigmoid diverticulitis in the past.  Past Medical History  Diagnosis Date  . Diverticulitis     Past Surgical History  Procedure Laterality Date  . Nose surgery      No family history on file.  Social History:  reports that he has been smoking.  He does not have any smokeless tobacco history on file. He reports that he drinks alcohol. He reports that he does not use illicit drugs.  Allergies: No Known Allergies  Medications: I have reviewed the patient's current medications.  Results for orders placed or performed during the hospital encounter of 09/03/15 (from the past 48 hour(s))  CBC with Differential     Status: Abnormal   Collection Time: 09/03/15  3:15 PM  Result Value Ref Range   WBC 16.8 (H) 4.0 - 10.5 K/uL   RBC 4.54 4.22 - 5.81 MIL/uL   Hemoglobin 14.8 13.0 - 17.0 g/dL   HCT 41.4 39.0 - 52.0 %   MCV 91.2 78.0 - 100.0 fL   MCH 32.6 26.0 - 34.0 pg   MCHC 35.7 30.0 - 36.0 g/dL   RDW 12.2 11.5 - 15.5 %   Platelets 323 150 - 400 K/uL   Neutrophils Relative % 72 %   Neutro Abs 12.1 (H) 1.7 - 7.7 K/uL   Lymphocytes Relative 15 %   Lymphs Abs 2.6 0.7 - 4.0 K/uL   Monocytes Relative 12 %   Monocytes Absolute 2.0 (H) 0.1 - 1.0 K/uL   Eosinophils Relative 1 %   Eosinophils Absolute 0.1 0.0 - 0.7 K/uL   Basophils Relative 0 %   Basophils Absolute 0.0 0.0 - 0.1 K/uL  Comprehensive metabolic panel     Status: None   Collection Time: 09/03/15  3:15 PM  Result Value Ref Range   Sodium 137 135 - 145 mmol/L   Potassium 3.5 3.5 - 5.1 mmol/L   Chloride 103 101 - 111 mmol/L   CO2 26 22 - 32 mmol/L   Glucose, Bld 89 65 - 99 mg/dL   BUN 10 6 - 20 mg/dL   Creatinine, Ser 1.01 0.61 - 1.24 mg/dL   Calcium 9.1 8.9 - 10.3 mg/dL   Total Protein 7.7 6.5 - 8.1 g/dL   Albumin 4.3 3.5 - 5.0 g/dL   AST 20 15 - 41 U/L   ALT 25 17 - 63 U/L   Alkaline Phosphatase 42 38 - 126 U/L   Total Bilirubin 1.1 0.3 - 1.2 mg/dL   GFR calc non Af Amer >60 >60 mL/min   GFR calc Af Amer >60 >60 mL/min    Comment: (NOTE) The eGFR has been calculated using the CKD EPI equation. This calculation has not been validated in all clinical situations. eGFR's persistently <60 mL/min signify possible Chronic Kidney Disease.    Anion gap 8 5 - 15  Lipase, blood     Status: None   Collection Time: 09/03/15  3:15 PM  Result Value Ref Range   Lipase 31 11 - 51 U/L  Lactic acid, plasma     Status: None   Collection Time: 09/03/15  3:15 PM  Result Value Ref Range   Lactic Acid, Venous 1.2 0.5 - 2.0 mmol/L  Urinalysis, Routine w reflex microscopic (not at [facility])     Status: None   Collection Time: 09/03/15  3:25 PM  Result Value Ref Range   Color, Urine YELLOW YELLOW   APPearance CLEAR CLEAR   Specific Gravity, Urine 1.020 1.005 - 1.030   pH 6.0 5.0 - 8.0   Glucose, UA NEGATIVE NEGATIVE mg/dL   Hgb urine dipstick NEGATIVE NEGATIVE   Bilirubin Urine NEGATIVE NEGATIVE   Ketones, ur NEGATIVE NEGATIVE mg/dL   Protein, ur NEGATIVE NEGATIVE mg/dL   Nitrite NEGATIVE NEGATIVE   Leukocytes, UA NEGATIVE NEGATIVE    Comment: MICROSCOPIC NOT DONE ON URINES WITH NEGATIVE PROTEIN, BLOOD, LEUKOCYTES, NITRITE, OR GLUCOSE <1000 mg/dL.  Lactic acid, plasma     Status: None   Collection Time:  09/03/15  6:21 PM  Result Value Ref Range   Lactic Acid, Venous 0.9 0.5 - 2.0 mmol/L  CBC     Status: Abnormal   Collection Time: 09/04/15  5:33 AM  Result Value Ref Range   WBC 12.3 (H) 4.0 - 10.5 K/uL   RBC 4.09 (L) 4.22 - 5.81 MIL/uL   Hemoglobin 13.1 13.0 - 17.0 g/dL   HCT 37.9 (L) 39.0 - 52.0 %   MCV 92.7 78.0 - 100.0 fL   MCH 32.0 26.0 - 34.0 pg   MCHC 34.6 30.0 - 36.0 g/dL   RDW 12.3 11.5 - 15.5 %   Platelets 293 150 - 400 K/uL  Comprehensive metabolic panel     Status: Abnormal   Collection Time: 09/04/15  5:33 AM  Result Value Ref Range   Sodium 137 135 - 145 mmol/L   Potassium 4.0 3.5 - 5.1 mmol/L   Chloride 102 101 - 111 mmol/L   CO2 29 22 - 32 mmol/L   Glucose, Bld 123 (H) 65 - 99 mg/dL   BUN 10 6 - 20 mg/dL   Creatinine, Ser 0.95 0.61 - 1.24 mg/dL   Calcium 8.8 (L) 8.9 - 10.3 mg/dL   Total Protein 7.1 6.5 - 8.1 g/dL   Albumin 3.8 3.5 - 5.0 g/dL   AST 19 15 - 41 U/L   ALT 22 17 - 63 U/L   Alkaline Phosphatase 38 38 - 126 U/L   Total Bilirubin 1.1 0.3 - 1.2 mg/dL   GFR calc non Af Amer >60 >60 mL/min   GFR calc Af Amer >60 >60 mL/min    Comment: (NOTE) The eGFR has been calculated using the CKD EPI equation. This calculation has not been validated in all clinical situations. eGFR's persistently <60 mL/min signify possible Chronic Kidney Disease.    Anion gap 6 5 - 15    Dg Chest 2 View  09/03/2015  CLINICAL DATA:  Patient with nausea, vomiting and fever. Left lower quadrant abdominal pain. EXAM: CHEST  2 VIEW COMPARISON:  None. FINDINGS: Cardiac contours upper limits of normal. No consolidative pulmonary opacities. No pleural effusion or pneumothorax. Regional skeleton is unremarkable. IMPRESSION: No acute cardiopulmonary process. Electronically Signed   By: Lovey Newcomer M.D.   On: 09/03/2015 16:26   Ct Abdomen Pelvis W Contrast  09/03/2015  CLINICAL DATA:  Left  lower quadrant pain with nausea and vomiting for 1 day EXAM: CT ABDOMEN AND PELVIS WITH CONTRAST  TECHNIQUE: Multidetector CT imaging of the abdomen and pelvis was performed using the standard protocol following bolus administration of intravenous contrast. Oral contrast was also administered. CONTRAST:  20m OMNIPAQUE IOHEXOL 300 MG/ML  SOLN COMPARISON:  October 23, 2014 FINDINGS: Lower chest: There is slight bibasilar lung atelectasis. Lung bases are otherwise clear. Hepatobiliary: The liver is prominent, measuring 20.9 cm in length. No focal liver lesions are identified. Gallbladder wall is not appreciably thickened. There is no biliary duct dilatation. Pancreas: No pancreatic mass or inflammatory focus. Spleen: No splenic lesions are identified. Adrenals/Urinary Tract: Adrenals appear normal bilaterally. Kidneys bilaterally show no mass or hydronephrosis on either side. There is no renal or ureteral calculus on either side. Urinary bladder is midline with wall thickness within normal limits. Stomach/Bowel: There is wall thickening throughout the proximal and mid sigmoid colon with extensive surrounding mesenteric thickening and wall thickening consistent with diverticulitis. There is a 1.0 x 0.7 cm focus of decreased attenuation within the area of diverticulitis in the proximal sigmoid colon, likely a tiny diverticular abscess. No evidence of perforation in the area of diverticulitis. Elsewhere, there is no appreciable bowel wall or mesenteric thickening. No bowel obstruction. No free air or portal venous air. Vascular/Lymphatic: There is no abdominal aortic aneurysm. Major mesenteric vessels appear patent. There are scattered retroperitoneal and inguinal lymph nodes which do not meet size criteria for pathologic significance. By size criteria, there is no demonstrable adenopathy in the abdomen or pelvis. Reproductive: Prostate and seminal vesicles are normal in size and contour. Other: Appendix appears normal. No ascites. No abscess is seen outside of the area of diverticulitis. Musculoskeletal: There are  no blastic or lytic bone lesions. No intramuscular or abdominal wall lesion. IMPRESSION: Extensive sigmoid diverticulitis. 1.0 x 0.7 cm focus of decreased attenuation noted with in the area of diverticulitis in the proximal sigmoid colon, probably an early small diverticular abscess. No other evidence suggesting abscess. No evidence of perforation. No bowel obstruction.  Appendix appears normal. No renal or ureteral calculus.  No hydronephrosis. Prominent liver without focal liver lesion. Electronically Signed   By: WLowella GripIII M.D.   On: 09/03/2015 16:41    ROS: See chart Blood pressure 106/40, pulse 61, temperature 98.2 F (36.8 C), temperature source Oral, resp. rate 18, height 6' (1.829 m), weight 99.1 kg (218 lb 7.6 oz), SpO2 94 %. Physical Exam: Pleasant white male in no acute distress. Abdomen is soft with slight distention noted. No rigidity is noted. Localized left lower quadrant pain to palpation is noted.  Assessment/Plan: Impression: Recurrent sigmoid diverticulitis Plan: Would continue IV ciprofloxacin and Flagyl as ordered. Will advance diet as tolerated. I had an extensive discussion with the patient who does want to proceed with partial colectomy in the future due to the recurrent nature of his diverticulitis. He will need 2 weeks worth of antibiotics. I will follow him in my office as an outpatient. Surgery will be performed electively as an outpatient.  Terika Pillard A 09/04/2015, 11:03 AM

## 2015-09-04 NOTE — Progress Notes (Signed)
[facility] PROGRESS NOTE  CAILEN PURNELL [id number] DOB: 11-04-1965    PCP:   No primary care provider on file.   HPI: Devin Ryan is an 49 y.o. male with recurrent diverticulitis admitted for extensive diverticulitis with small abscess but no perforation.  Surgery saw him and advanced his diet.  He will need 2 weeks of antibiotics total, and subsequent hemicolectomy.  He is feeling better and the WBC has decreased.    Rewiew of Systems:  Constitutional: Negative for malaise, fever and chills. No significant weight loss or weight gain Eyes: Negative for eye pain, redness and discharge, diplopia, visual changes, or flashes of light. ENMT: Negative for ear pain, hoarseness, nasal congestion, sinus pressure and sore throat. No headaches; tinnitus, drooling, or problem swallowing. Cardiovascular: Negative for chest pain, palpitations, diaphoresis, dyspnea and peripheral edema. ; No orthopnea, PND Respiratory: Negative for cough, hemoptysis, wheezing and stridor. No pleuritic chestpain. Gastrointestinal: Negative for nausea, vomiting, diarrhea, constipation, abdominal pain, melena, blood in stool, hematemesis, jaundice and rectal bleeding.    Genitourinary: Negative for frequency, dysuria, incontinence,flank pain and hematuria; Musculoskeletal: Negative for back pain and neck pain. Negative for swelling and trauma.;  Skin: . Negative for pruritus, rash, abrasions, bruising and skin lesion.; ulcerations Neuro: Negative for headache, lightheadedness and neck stiffness. Negative for weakness, altered level of consciousness , altered mental status, extremity weakness, burning feet, involuntary movement, seizure and syncope.  Psych: negative for anxiety, depression, insomnia, tearfulness, panic attacks, hallucinations, paranoia, suicidal or homicidal ideation    Past Medical History  Diagnosis Date  . Diverticulitis     Past Surgical History  Procedure Laterality Date  . Nose  surgery      Medications:  HOME MEDS: Prior to Admission medications   Medication Sig Start Date End Date Taking? Authorizing Provider  ALPRAZolam Duanne Moron) 0.5 MG tablet Take 0.5 mg by mouth 2 (two) times daily as needed. anxiety 06/04/15  Yes Historical Provider, MD  citalopram (CELEXA) 40 MG tablet Take 40 mg by mouth daily. 08/10/15  Yes Historical Provider, MD  Eszopiclone 3 MG TABS Take 3 mg by mouth at bedtime. 08/10/15  Yes Historical Provider, MD  fenofibrate micronized (LOFIBRA) 134 MG capsule Take 134 mg by mouth daily. 08/10/15  Yes Historical Provider, MD  gabapentin (NEURONTIN) 300 MG capsule Take 300 mg by mouth at bedtime. 08/20/15  Yes Historical Provider, MD  LORazepam (ATIVAN) 0.5 MG tablet Take 0.5 mg by mouth 2 (two) times daily as needed. anxiety 07/07/15  Yes Historical Provider, MD  PROAIR HFA 108 (90 BASE) MCG/ACT inhaler Inhale 1 puff into the lungs every 6 (six) hours as needed. Shortness of breath 08/17/15   Historical Provider, MD     Allergies:  No Known Allergies  Social History:   reports that he has been smoking.  He does not have any smokeless tobacco history on file. He reports that he drinks alcohol. He reports that he does not use illicit drugs.  Family History: No family history on file.   Physical Exam: Filed Vitals:   09/03/15 1448 09/03/15 1744 09/03/15 1932 09/04/15 0430  BP: 111/81 121/68 126/59 106/40  Pulse: 97 87 89 61  Temp: 99 F (37.2 C) 99.1 F (37.3 C) 99.1 F (37.3 C) 98.2 F (36.8 C)  TempSrc: Oral Oral Oral Oral  Resp: 18 18 18 18   Height: 6' (1.829 m)  6' (1.829 m)   Weight: 99.791 kg (220 lb)  99.1 kg (218 lb 7.6 oz)   SpO2:  99% 95% 94% 94%   Blood pressure 106/40, pulse 61, temperature 98.2 F (36.8 C), temperature source Oral, resp. rate 18, height 6' (1.829 m), weight 99.1 kg (218 lb 7.6 oz), SpO2 94 %.  GEN:  Pleasant  patient lying in the stretcher in no acute distress; cooperative with exam. PSYCH:  alert and  oriented x4; does not appear anxious or depressed; affect is appropriate. HEENT: Mucous membranes pink and anicteric; PERRLA; EOM intact; no cervical lymphadenopathy nor thyromegaly or carotid bruit; no JVD; There were no stridor. Neck is very supple. Breasts:: Not examined CHEST WALL: No tenderness CHEST: Normal respiration, clear to auscultation bilaterally.  HEART: Regular rate and rhythm.  There are no murmur, rub, or gallops.   BACK: No kyphosis or scoliosis; no CVA tenderness ABDOMEN: soft and non-tender; no masses, no organomegaly, normal abdominal bowel sounds; no pannus; no intertriginous candida. There is no rebound and no distention. Rectal Exam: Not done EXTREMITIES: No bone or joint deformity; age-appropriate arthropathy of the hands and knees; no edema; no ulcerations.  There is no calf tenderness. Genitalia: not examined PULSES: 2+ and symmetric SKIN: Normal hydration no rash or ulceration CNS: Cranial nerves 2-12 grossly intact no focal lateralizing neurologic deficit.  Speech is fluent; uvula elevated with phonation, facial symmetry and tongue midline. DTR are normal bilaterally, cerebella exam is intact, barbinski is negative and strengths are equaled bilaterally.  No sensory loss.   Labs on Admission:  Basic Metabolic Panel:  Recent Labs Lab 09/03/15 1515 09/04/15 0533  NA 137 137  K 3.5 4.0  CL 103 102  CO2 26 29  GLUCOSE 89 123*  BUN 10 10  CREATININE 1.01 0.95  CALCIUM 9.1 8.8*   Liver Function Tests:  Recent Labs Lab 09/03/15 1515 09/04/15 0533  AST 20 19  ALT 25 22  ALKPHOS 42 38  BILITOT 1.1 1.1  PROT 7.7 7.1  ALBUMIN 4.3 3.8    Recent Labs Lab 09/03/15 1515  LIPASE 31   No results for input(s): AMMONIA in the last 168 hours. CBC:  Recent Labs Lab 09/03/15 1515 09/04/15 0533  WBC 16.8* 12.3*  NEUTROABS 12.1*  --   HGB 14.8 13.1  HCT 41.4 37.9*  MCV 91.2 92.7  PLT 323 293   Cardiac Enzymes: No results for input(s): CKTOTAL,  CKMB, CKMBINDEX, TROPONINI in the last 168 hours.  CBG: No results for input(s): GLUCAP in the last 168 hours.   Radiological Exams on Admission: Dg Chest 2 View  09/03/2015  CLINICAL DATA:  Patient with nausea, vomiting and fever. Left lower quadrant abdominal pain. EXAM: CHEST  2 VIEW COMPARISON:  None. FINDINGS: Cardiac contours upper limits of normal. No consolidative pulmonary opacities. No pleural effusion or pneumothorax. Regional skeleton is unremarkable. IMPRESSION: No acute cardiopulmonary process. Electronically Signed   By: Lovey Newcomer M.D.   On: 09/03/2015 16:26   Ct Abdomen Pelvis W Contrast  09/03/2015  CLINICAL DATA:  Left lower quadrant pain with nausea and vomiting for 1 day EXAM: CT ABDOMEN AND PELVIS WITH CONTRAST TECHNIQUE: Multidetector CT imaging of the abdomen and pelvis was performed using the standard protocol following bolus administration of intravenous contrast. Oral contrast was also administered. CONTRAST:  18mL OMNIPAQUE IOHEXOL 300 MG/ML  SOLN COMPARISON:  October 23, 2014 FINDINGS: Lower chest: There is slight bibasilar lung atelectasis. Lung bases are otherwise clear. Hepatobiliary: The liver is prominent, measuring 20.9 cm in length. No focal liver lesions are identified. Gallbladder wall is not appreciably thickened. There is  no biliary duct dilatation. Pancreas: No pancreatic mass or inflammatory focus. Spleen: No splenic lesions are identified. Adrenals/Urinary Tract: Adrenals appear normal bilaterally. Kidneys bilaterally show no mass or hydronephrosis on either side. There is no renal or ureteral calculus on either side. Urinary bladder is midline with wall thickness within normal limits. Stomach/Bowel: There is wall thickening throughout the proximal and mid sigmoid colon with extensive surrounding mesenteric thickening and wall thickening consistent with diverticulitis. There is a 1.0 x 0.7 cm focus of decreased attenuation within the area of diverticulitis in  the proximal sigmoid colon, likely a tiny diverticular abscess. No evidence of perforation in the area of diverticulitis. Elsewhere, there is no appreciable bowel wall or mesenteric thickening. No bowel obstruction. No free air or portal venous air. Vascular/Lymphatic: There is no abdominal aortic aneurysm. Major mesenteric vessels appear patent. There are scattered retroperitoneal and inguinal lymph nodes which do not meet size criteria for pathologic significance. By size criteria, there is no demonstrable adenopathy in the abdomen or pelvis. Reproductive: Prostate and seminal vesicles are normal in size and contour. Other: Appendix appears normal. No ascites. No abscess is seen outside of the area of diverticulitis. Musculoskeletal: There are no blastic or lytic bone lesions. No intramuscular or abdominal wall lesion. IMPRESSION: Extensive sigmoid diverticulitis. 1.0 x 0.7 cm focus of decreased attenuation noted with in the area of diverticulitis in the proximal sigmoid colon, probably an early small diverticular abscess. No other evidence suggesting abscess. No evidence of perforation. No bowel obstruction.  Appendix appears normal. No renal or ureteral calculus.  No hydronephrosis. Prominent liver without focal liver lesion. Electronically Signed   By: Lowella Grip III M.D.   On: 09/03/2015 16:41   Assessment/Plan Present on Admission:  . Acute diverticulitis . Diverticulitis large intestine . Tobacco abuse . Depression . Insomnia  PLAN:  Recurrent diverticulitis:  Will continue with current Tx.  Doing better.  Continue to advance diet as he can tolerated.  Surgery planned follow up hemicolectomy in about 6 weeks.  Thanks.  Other plans as per orders.  Code Status: FULL Haskel Khan, MD. [facility] Pager [phone] 7pm to 7am.  09/04/2015, 2:15 PM

## 2015-09-05 MED ORDER — METRONIDAZOLE 500 MG PO TABS: 500.0000 mg | ORAL_TABLET | Freq: Three times a day (TID) | ORAL | Status: DC

## 2015-09-05 MED ORDER — CIPROFLOXACIN HCL 250 MG PO TABS
500.0000 mg | ORAL_TABLET | Freq: Two times a day (BID) | ORAL | Status: DC
  Administered 2015-09-05: 500 mg via ORAL
  Filled 2015-09-05: qty 2

## 2015-09-05 MED ORDER — CIPROFLOXACIN HCL 500 MG PO TABS: 500.0000 mg | ORAL_TABLET | Freq: Two times a day (BID) | ORAL | Status: DC

## 2015-09-05 NOTE — Progress Notes (Signed)
Subjective: Feels much better. Is passing gas. Tolerating full liquid diet well.  Objective: Vital signs in last 24 hours: Temp:  [97.8 F (36.6 C)-98.3 F (36.8 C)] 98.3 F (36.8 C) (11/26 0606) Pulse Rate:  [60-70] 60 (11/26 0606) Resp:  [18] 18 (11/26 0606) BP: (105-115)/(57-61) 109/57 mmHg (11/26 0606) SpO2:  [94 %-95 %] 95 % (11/26 0606) Last BM Date: 09/03/15  Intake/Output from previous day: 11/25 0701 - 11/26 0700 In: 480 [P.O.:480] Out: 550 [Urine:550] Intake/Output this shift:    General appearance: alert, cooperative and no distress GI: Soft, minimal discomfort to palpation in left lower quadrant. No rigidity noted. Less distention noted. Bowel sounds active.  Lab Results:   Recent Labs  09/03/15 1515 09/04/15 0533  WBC 16.8* 12.3*  HGB 14.8 13.1  HCT 41.4 37.9*  PLT 323 293   BMET  Recent Labs  09/03/15 1515 09/04/15 0533  NA 137 137  K 3.5 4.0  CL 103 102  CO2 26 29  GLUCOSE 89 123*  BUN 10 10  CREATININE 1.01 0.95  CALCIUM 9.1 8.8*   PT/INR No results for input(s): LABPROT, INR in the last 72 hours.  Studies/Results: Dg Chest 2 View  09/03/2015  CLINICAL DATA:  Patient with nausea, vomiting and fever. Left lower quadrant abdominal pain. EXAM: CHEST  2 VIEW COMPARISON:  None. FINDINGS: Cardiac contours upper limits of normal. No consolidative pulmonary opacities. No pleural effusion or pneumothorax. Regional skeleton is unremarkable. IMPRESSION: No acute cardiopulmonary process. Electronically Signed   By: Lovey Newcomer M.D.   On: 09/03/2015 16:26   Ct Abdomen Pelvis W Contrast  09/03/2015  CLINICAL DATA:  Left lower quadrant pain with nausea and vomiting for 1 day EXAM: CT ABDOMEN AND PELVIS WITH CONTRAST TECHNIQUE: Multidetector CT imaging of the abdomen and pelvis was performed using the standard protocol following bolus administration of intravenous contrast. Oral contrast was also administered. CONTRAST:  68mL OMNIPAQUE IOHEXOL 300  MG/ML  SOLN COMPARISON:  October 23, 2014 FINDINGS: Lower chest: There is slight bibasilar lung atelectasis. Lung bases are otherwise clear. Hepatobiliary: The liver is prominent, measuring 20.9 cm in length. No focal liver lesions are identified. Gallbladder wall is not appreciably thickened. There is no biliary duct dilatation. Pancreas: No pancreatic mass or inflammatory focus. Spleen: No splenic lesions are identified. Adrenals/Urinary Tract: Adrenals appear normal bilaterally. Kidneys bilaterally show no mass or hydronephrosis on either side. There is no renal or ureteral calculus on either side. Urinary bladder is midline with wall thickness within normal limits. Stomach/Bowel: There is wall thickening throughout the proximal and mid sigmoid colon with extensive surrounding mesenteric thickening and wall thickening consistent with diverticulitis. There is a 1.0 x 0.7 cm focus of decreased attenuation within the area of diverticulitis in the proximal sigmoid colon, likely a tiny diverticular abscess. No evidence of perforation in the area of diverticulitis. Elsewhere, there is no appreciable bowel wall or mesenteric thickening. No bowel obstruction. No free air or portal venous air. Vascular/Lymphatic: There is no abdominal aortic aneurysm. Major mesenteric vessels appear patent. There are scattered retroperitoneal and inguinal lymph nodes which do not meet size criteria for pathologic significance. By size criteria, there is no demonstrable adenopathy in the abdomen or pelvis. Reproductive: Prostate and seminal vesicles are normal in size and contour. Other: Appendix appears normal. No ascites. No abscess is seen outside of the area of diverticulitis. Musculoskeletal: There are no blastic or lytic bone lesions. No intramuscular or abdominal wall lesion. IMPRESSION: Extensive sigmoid diverticulitis. 1.0  x 0.7 cm focus of decreased attenuation noted with in the area of diverticulitis in the proximal sigmoid  colon, probably an early small diverticular abscess. No other evidence suggesting abscess. No evidence of perforation. No bowel obstruction.  Appendix appears normal. No renal or ureteral calculus.  No hydronephrosis. Prominent liver without focal liver lesion. Electronically Signed   By: Lowella Grip III M.D.   On: 09/03/2015 16:41    Anti-infectives: Anti-infectives    Start     Dose/Rate Route Frequency Ordered Stop   09/04/15 0600  ciprofloxacin (CIPRO) IVPB 400 mg     400 mg 200 mL/hr over 60 Minutes Intravenous Every 12 hours 09/03/15 1923     09/04/15 0200  metroNIDAZOLE (FLAGYL) IVPB 500 mg     500 mg 100 mL/hr over 60 Minutes Intravenous Every 8 hours 09/03/15 1923 09/14/15 0159   09/04/15 0000  ciprofloxacin (CIPRO) IVPB 400 mg  Status:  Discontinued     400 mg 200 mL/hr over 60 Minutes Intravenous Every 12 hours 09/03/15 1923 09/03/15 1936   09/03/15 1730  ciprofloxacin (CIPRO) IVPB 400 mg     400 mg 200 mL/hr over 60 Minutes Intravenous  Once 09/03/15 1717     09/03/15 1700  metroNIDAZOLE (FLAGYL) IVPB 500 mg     500 mg 100 mL/hr over 60 Minutes Intravenous  Once 09/03/15 1658 09/03/15 1840      Assessment/Plan: Impression: Sigmoid diverticulitis, resolving Plan: Okay for discharge from surgery standpoint. I will see patient in 2 weeks in my office for follow-up. Will sign off.  LOS: 2 days    Devin Ryan A 09/05/2015

## 2015-09-05 NOTE — Progress Notes (Signed)
IV access removed, patient wanting shower prior to discharge.

## 2015-09-05 NOTE — Discharge Instructions (Signed)
Diverticulitis °Diverticulitis is inflammation or infection of small pouches in your colon that form when you have a condition called diverticulosis. The pouches in your colon are called diverticula. Your colon, or large intestine, is where water is absorbed and stool is formed. °Complications of diverticulitis can include: °· Bleeding. °· Severe infection. °· Severe pain. °· Perforation of your colon. °· Obstruction of your colon. °CAUSES  °Diverticulitis is caused by bacteria. °Diverticulitis happens when stool becomes trapped in diverticula. This allows bacteria to grow in the diverticula, which can lead to inflammation and infection. °RISK FACTORS °People with diverticulosis are at risk for diverticulitis. Eating a diet that does not include enough fiber from fruits and vegetables may make diverticulitis more likely to develop. °SYMPTOMS  °Symptoms of diverticulitis may include: °· Abdominal pain and tenderness. The pain is normally located on the left side of the abdomen, but may occur in other areas. °· Fever and chills. °· Bloating. °· Cramping. °· Nausea. °· Vomiting. °· Constipation. °· Diarrhea. °· Blood in your stool. °DIAGNOSIS  °Your health care provider will ask you about your medical history and do a physical exam. You may need to have tests done because many medical conditions can cause the same symptoms as diverticulitis. Tests may include: °· Blood tests. °· Urine tests. °· Imaging tests of the abdomen, including X-rays and CT scans. °When your condition is under control, your health care provider may recommend that you have a colonoscopy. A colonoscopy can show how severe your diverticula are and whether something else is causing your symptoms. °TREATMENT  °Most cases of diverticulitis are mild and can be treated at home. Treatment may include: °· Taking over-the-counter pain medicines. °· Following a clear liquid diet. °· Taking antibiotic medicines by mouth for 7-10 days. °More severe cases may  be treated at a hospital. Treatment may include: °· Not eating or drinking. °· Taking prescription pain medicine. °· Receiving antibiotic medicines through an IV tube. °· Receiving fluids and nutrition through an IV tube. °· Surgery. °HOME CARE INSTRUCTIONS  °· Follow your health care provider's instructions carefully. °· Follow a full liquid diet or other diet as directed by your health care provider. After your symptoms improve, your health care provider may tell you to change your diet. He or she may recommend you eat a high-fiber diet. Fruits and vegetables are good sources of fiber. Fiber makes it easier to pass stool. °· Take fiber supplements or probiotics as directed by your health care provider. °· Only take medicines as directed by your health care provider. °· Keep all your follow-up appointments. °SEEK MEDICAL CARE IF:  °· Your pain does not improve. °· You have a hard time eating food. °· Your bowel movements do not return to normal. °SEEK IMMEDIATE MEDICAL CARE IF:  °· Your pain becomes worse. °· Your symptoms do not get better. °· Your symptoms suddenly get worse. °· You have a fever. °· You have repeated vomiting. °· You have bloody or black, tarry stools. °MAKE SURE YOU:  °· Understand these instructions. °· Will watch your condition. °· Will get help right away if you are not doing well or get worse. °  °This information is not intended to replace advice given to you by your health care provider. Make sure you discuss any questions you have with your health care provider. °  °Document Released: 07/06/2005 Document Revised: 10/01/2013 Document Reviewed: 08/21/2013 °Elsevier Interactive Patient Education ©2016 [employer] ° °

## 2015-09-05 NOTE — Discharge Summary (Signed)
Physician Discharge Summary  Devin Ryan [id number] DOB: [birth year] DOA: 09/03/2015  PCP: No primary care provider on file.  Admit date: 09/03/2015 Discharge date: 09/05/2015  Time spent: 35 minutes  Recommendations for Outpatient Follow-up:  1. Follow up with Dr Arnoldo Morale in 2 weeks.   Discharge Diagnoses:  Principal Problem:   Diverticulitis large intestine Active Problems:   Acute diverticulitis   Tobacco abuse   Depression   Insomnia   Discharge Condition: much improved.  No pain and was able to eat breakfast.   Diet recommendation: [place].  No seedy food.   Filed Weights   09/03/15 1448 09/03/15 1932  Weight: 99.791 kg (220 lb) 99.1 kg (218 lb 7.6 oz)    History of present illness: patient was admitted to the hospital by me on Sep 03, 2015 for acute diverticulitis.  As per my H and P:  " Devin Ryan is an 49 y.o. male with hx of several bouts of diverticulitis in the past, last coloncoscopy about 5 years ago, hx of depression and anxiety, tobacco abuse, but no CAD, HTN, or DM, presents to the ER with LLQ pain for at least a week, and intermittent fever for a couple of weeks. Work up in the ER showed leukocytosis with WBC of 16.7K, and normal Hb, renal Fx tests and hepatic Fx tests. An abdominal pelvic CT showed large amount of diverticultis, with one possible diverticular abscess, but no perforations. His CXR was clear. He was given IV Cipro and IV Flagyl, and hospitlist was asked to admit him for recurrent diverticulitis.    Hospital Course: Patient was made NPO, admitted, given IVF, IV pain meds, and his Cipro and Flagyl IV were continued.  His stay was quite unremarkable.  He was seen in consultation with Dr Arnoldo Morale, and his diet was slowly advanced, and he tolerated well.  Dr Arnoldo Morale offered to see him in follow up in 2 weeks, and will likely perform hemicolectomy to prevent recurrence.  His last colonoscopy was 5 years ago, and will defer repeat colonscopy  to him and his surgeon.  He is stable for discharge, and will be discharge today.  He was offered narcotics, but he declined.  He will see Dr Arnoldo Morale in 2 weeks for follow up.  I cautioned him against drinking ANY alcohol while is he is Flagyl due to the Disulfuram reaction.  Thank you for allowing me to partake in his care.  Good Day.      Consultations:  Surgery:  Dr Arnoldo Morale.   Discharge Exam: Filed Vitals:   09/04/15 2125 09/05/15 0606  BP: 105/60 109/57  Pulse: 66 60  Temp: 98.3 F (36.8 C) 98.3 F (36.8 C)  Resp: 18 18   Discharge Instructions   Discharge Instructions    Diet - low sodium heart healthy    Complete by:  As directed      Discharge instructions    Complete by:  As directed   Take your medicine as directed.  Do not use alcohol especially when on Flagyl.  See Dr Arnoldo Morale in 2 weeks.  Avoid food with seeds.     Increase activity slowly    Complete by:  As directed           Current Discharge Medication List    START taking these medications   Details  ciprofloxacin (CIPRO) 500 MG tablet Take 1 tablet (500 mg total) by mouth 2 (two) times daily. Qty: 24 tablet, Refills: 0    metroNIDAZOLE (  FLAGYL) 500 MG tablet Take 1 tablet (500 mg total) by mouth 3 (three) times daily. Qty: 36 tablet, Refills: 0      CONTINUE these medications which have NOT CHANGED   Details  ALPRAZolam (XANAX) 0.5 MG tablet Take 0.5 mg by mouth 2 (two) times daily as needed. anxiety    citalopram (CELEXA) 40 MG tablet Take 40 mg by mouth daily.    Eszopiclone 3 MG TABS Take 3 mg by mouth at bedtime.    fenofibrate micronized (LOFIBRA) 134 MG capsule Take 134 mg by mouth daily.    gabapentin (NEURONTIN) 300 MG capsule Take 300 mg by mouth at bedtime.    LORazepam (ATIVAN) 0.5 MG tablet Take 0.5 mg by mouth 2 (two) times daily as needed. anxiety    PROAIR HFA 108 (90 BASE) MCG/ACT inhaler Inhale 1 puff into the lungs every 6 (six) hours as needed. Shortness of breath        No Known Allergies Follow-up Information    Follow up with Jamesetta So, MD. Schedule an appointment as soon as possible for a visit on 09/17/2015.   Specialty:  General Surgery   Contact information:   [address] [place] [id number] [phone]        The results of significant diagnostics from this hospitalization (including imaging, microbiology, ancillary and laboratory) are listed below for reference.    Significant Diagnostic Studies: Dg Chest 2 View  09/03/2015  CLINICAL DATA:  Patient with nausea, vomiting and fever. Left lower quadrant abdominal pain. EXAM: CHEST  2 VIEW COMPARISON:  None. FINDINGS: Cardiac contours upper limits of normal. No consolidative pulmonary opacities. No pleural effusion or pneumothorax. Regional skeleton is unremarkable. IMPRESSION: No acute cardiopulmonary process. Electronically Signed   By: Lovey Newcomer M.D.   On: 09/03/2015 16:26   Ct Abdomen Pelvis W Contrast  09/03/2015  CLINICAL DATA:  Left lower quadrant pain with nausea and vomiting for 1 day EXAM: CT ABDOMEN AND PELVIS WITH CONTRAST TECHNIQUE: Multidetector CT imaging of the abdomen and pelvis was performed using the standard protocol following bolus administration of intravenous contrast. Oral contrast was also administered. CONTRAST:  27mL OMNIPAQUE IOHEXOL 300 MG/ML  SOLN COMPARISON:  October 23, 2014 FINDINGS: Lower chest: There is slight bibasilar lung atelectasis. Lung bases are otherwise clear. Hepatobiliary: The liver is prominent, measuring 20.9 cm in length. No focal liver lesions are identified. Gallbladder wall is not appreciably thickened. There is no biliary duct dilatation. Pancreas: No pancreatic mass or inflammatory focus. Spleen: No splenic lesions are identified. Adrenals/Urinary Tract: Adrenals appear normal bilaterally. Kidneys bilaterally show no mass or hydronephrosis on either side. There is no renal or ureteral calculus on either side. Urinary bladder is  midline with wall thickness within normal limits. Stomach/Bowel: There is wall thickening throughout the proximal and mid sigmoid colon with extensive surrounding mesenteric thickening and wall thickening consistent with diverticulitis. There is a 1.0 x 0.7 cm focus of decreased attenuation within the area of diverticulitis in the proximal sigmoid colon, likely a tiny diverticular abscess. No evidence of perforation in the area of diverticulitis. Elsewhere, there is no appreciable bowel wall or mesenteric thickening. No bowel obstruction. No free air or portal venous air. Vascular/Lymphatic: There is no abdominal aortic aneurysm. Major mesenteric vessels appear patent. There are scattered retroperitoneal and inguinal lymph nodes which do not meet size criteria for pathologic significance. By size criteria, there is no demonstrable adenopathy in the abdomen or pelvis. Reproductive: Prostate and seminal vesicles are normal in  size and contour. Other: Appendix appears normal. No ascites. No abscess is seen outside of the area of diverticulitis. Musculoskeletal: There are no blastic or lytic bone lesions. No intramuscular or abdominal wall lesion. IMPRESSION: Extensive sigmoid diverticulitis. 1.0 x 0.7 cm focus of decreased attenuation noted with in the area of diverticulitis in the proximal sigmoid colon, probably an early small diverticular abscess. No other evidence suggesting abscess. No evidence of perforation. No bowel obstruction.  Appendix appears normal. No renal or ureteral calculus.  No hydronephrosis. Prominent liver without focal liver lesion. Electronically Signed   By: Lowella Grip III M.D.   On: 09/03/2015 16:41    Labs: Basic Metabolic Panel:  Recent Labs Lab 09/03/15 1515 09/04/15 0533  NA 137 137  K 3.5 4.0  CL 103 102  CO2 26 29  GLUCOSE 89 123*  BUN 10 10  CREATININE 1.01 0.95  CALCIUM 9.1 8.8*   Liver Function Tests:  Recent Labs Lab 09/03/15 1515 09/04/15 0533  AST 20  19  ALT 25 22  ALKPHOS 42 38  BILITOT 1.1 1.1  PROT 7.7 7.1  ALBUMIN 4.3 3.8    Recent Labs Lab 09/03/15 1515  LIPASE 31   No results for input(s): AMMONIA in the last 168 hours. CBC:  Recent Labs Lab 09/03/15 1515 09/04/15 0533  WBC 16.8* 12.3*  NEUTROABS 12.1*  --   HGB 14.8 13.1  HCT 41.4 37.9*  MCV 91.2 92.7  PLT 323 293   Signed:  Lazer Wollard  [facility] 09/05/2015, 12:13 PM

## 2015-09-05 NOTE — Progress Notes (Signed)
Discharge instructions reviewed with patient, questions answered, understanding verbalized, diverticulitis teaching sheet included.  Left ambulatory accompanied by staff for discharge home, driving self.  Stable at discharge.

## 2015-09-17 NOTE — H&P (Signed)
  NTS SOAP Note  Vital Signs:  Vitals as of: 0000000: Systolic [id number]: Diastolic 74: Heart Rate 60: Temp 98.65F: Height 93ft 0in: Weight 220Lbs 0 Ounces: BMI 29.84  BMI : 29.84 kg/m2  Subjective: This 48 year old male presents for of recurrent diverticulitis.patient was recently hospitalized for recurrent sigmoid diverticulitis. He has had multiple episodes of left lower quadrant pain in the past. He had a colonoscopy in 2012 which revealed diverticulosis. He is currently finishing up outpatient antibiotic therapy. He now presents for scheduling surgery.  Mild left lower quadrant discomfort is noted on occasion. No fever, chills, or diarrhea been noted recently..  Review of Symptoms:    Past Medical History:  Reviewed  Past Medical History  Surgical History: nasal surgery Medical Problems: diverticulitis Allergies: nkda Medications: cipro, flagyl   Social History:Reviewed  Social History  Preferred Language: English Race:  White Ethnicity: Not Hispanic / Latino Age: 44 year Marital Status:  M Alcohol: yes   Smoking Status: Current every day smoker reviewed on 09/17/2015 Started Date:  Packs per week:  Functional Status reviewed on 09/17/2015 ------------------------------------------------ Bathing: Normal Cooking: Normal Dressing: Normal Driving: Normal Eating: Normal Managing Meds: Normal Oral Care: Normal Shopping: Normal Toileting: Normal Transferring: Normal Walking: Normal Cognitive Status reviewed on 09/17/2015 ------------------------------------------------ Attention: Normal Decision Making: Normal Language: Normal Memory: Normal Motor: Normal Perception: Normal Problem Solving: Normal Visual and Spatial: Normal   Family History:Reviewed  Family Health History Family History is Unknown    Objective Information: General:Well appearing, well nourished in no distress. Heart:RRR, no murmur Lungs:  CTA bilaterally, no wheezes, rhonchi,  rales.  Breathing unlabored. Abdomen:Soft, mild discomfort to deep palpation in left lower quadrant, ND, no HSM, no masses.  Assessment:recurrent sigmoid diverticulitis  Diagnoses: 562.11  K57.32 Diverticulitis of colon (Diverticulitis of large intestine without perforation or abscess without bleeding)  Procedures: 99213 - OFFICE OUTPATIENT VISIT 15 MINUTES    Plan:  Patient is scheduled for a partial colectomy on 09/28/2015.    Patient Education:Alternative treatments to surgery were discussed with patient (and family).  Risks and benefits  of procedure including bleeding, infection, anastomotic leak, and the possibility of recurrence of the diverticulitis were fully explained to the patient (and family) who gave informed consent. Patient/family questions were addressed.  Follow-up:Pending Surgery

## 2015-09-23 NOTE — Patient Instructions (Signed)
    Devin Ryan  09/23/2015     @PREFPERIOPPHARMACY @   Your procedure is scheduled on 09/28/15.  Report to [name] at 6:15 A.M.  Call this number if you have problems the morning of surgery:  [phone]   Remember:  Do not eat food or drink liquids after midnight.  Take these medicines the morning of surgery with A SIP OF WATER Xanax, Celexa, Gabapentin, Ativan, Pro Air    Do not wear jewelry, make-up or nail polish.  Do not wear lotions, powders, or perfumes.  You may wear deodorant.  Do not shave 48 hours prior to surgery.  Men may shave face and neck.  Do not bring valuables to the hospital.  [facility] is not responsible for any belongings or valuables.  Contacts, dentures or bridgework may not be worn into surgery.  Leave your suitcase in the car.  After surgery it may be brought to your room.  For patients admitted to the hospital, discharge time will be determined by your treatment team.  Patients discharged the day of surgery will not be allowed to drive home.    Please read over the following fact sheets that you were given. Surgical Site Infection Prevention and Anesthesia Post-op Instructions     PATIENT INSTRUCTIONS POST-ANESTHESIA  IMMEDIATELY FOLLOWING SURGERY:  Do not drive or operate machinery for the first twenty four hours after surgery.  Do not make any important decisions for twenty four hours after surgery or while taking narcotic pain medications or sedatives.  If you develop intractable nausea and vomiting or a severe headache please notify your doctor immediately.  FOLLOW-UP:  Please make an appointment with your surgeon as instructed. You do not need to follow up with anesthesia unless specifically instructed to do so.  WOUND CARE INSTRUCTIONS (if applicable):  Keep a dry clean dressing on the anesthesia/puncture wound site if there is drainage.  Once the wound has quit draining you may leave it open to air.  Generally you should leave the  bandage intact for twenty four hours unless there is drainage.  If the epidural site drains for more than 36-48 hours please call the anesthesia department.  QUESTIONS?:  Please feel free to call your physician or the hospital operator if you have any questions, and they will be happy to assist you.

## 2015-09-24 ENCOUNTER — Encounter (HOSPITAL_COMMUNITY): Payer: Self-pay

## 2015-09-24 ENCOUNTER — Encounter (HOSPITAL_COMMUNITY)
Admission: RE | Admit: 2015-09-24 | Discharge: 2015-09-24 | Disposition: A | Discharge status: Home / Self Care | Payer: BLUE CROSS/BLUE SHIELD | Source: Ambulatory Visit | Attending: General Surgery | Admitting: General Surgery

## 2015-09-24 ENCOUNTER — Other Ambulatory Visit: Payer: Self-pay

## 2015-09-24 DIAGNOSIS — K5732 Diverticulitis of large intestine without perforation or abscess without bleeding: Secondary | ICD-10-CM | POA: Insufficient documentation

## 2015-09-24 DIAGNOSIS — Z01818 Encounter for other preprocedural examination: Secondary | ICD-10-CM | POA: Insufficient documentation

## 2015-09-24 HISTORY — DX: Anxiety disorder, unspecified: F41.9

## 2015-09-24 LAB — CBC WITH DIFFERENTIAL/PLATELET
BASOS ABS: 0.1 10*3/uL (ref 0.0–0.1)
BASOS PCT: 1 %
EOS ABS: 0.3 10*3/uL (ref 0.0–0.7)
EOS PCT: 3 %
HCT: 42.3 % (ref 39.0–52.0)
Hemoglobin: 14.7 g/dL (ref 13.0–17.0)
LYMPHS PCT: 30 %
Lymphs Abs: 2.9 10*3/uL (ref 0.7–4.0)
MCH: 31.8 pg (ref 26.0–34.0)
MCHC: 34.8 g/dL (ref 30.0–36.0)
MCV: 91.6 fL (ref 78.0–100.0)
MONO ABS: 0.7 10*3/uL (ref 0.1–1.0)
Monocytes Relative: 8 %
Neutro Abs: 5.7 10*3/uL (ref 1.7–7.7)
Neutrophils Relative %: 58 %
PLATELETS: 323 10*3/uL (ref 150–400)
RBC: 4.62 MIL/uL (ref 4.22–5.81)
RDW: 12.1 % (ref 11.5–15.5)
WBC: 9.7 10*3/uL (ref 4.0–10.5)

## 2015-09-24 LAB — BASIC METABOLIC PANEL
Anion gap: 7 (ref 5–15)
BUN: 13 mg/dL (ref 6–20)
CALCIUM: 9.7 mg/dL (ref 8.9–10.3)
CO2: 30 mmol/L (ref 22–32)
CREATININE: 1 mg/dL (ref 0.61–1.24)
Chloride: 100 mmol/L — ABNORMAL LOW (ref 101–111)
GFR calc Af Amer: >60 mL/min (ref >60)
GLUCOSE: 119 mg/dL — AB (ref 65–99)
POTASSIUM: 4 mmol/L (ref 3.5–5.1)
SODIUM: 137 mmol/L (ref 135–145)

## 2015-09-24 NOTE — Pre-Procedure Instructions (Signed)
Patient given information to sign up for my chart at home. 

## 2015-09-26 LAB — TYPE AND SCREEN
ABO/RH(D): O NEG
ANTIBODY SCREEN: NEGATIVE

## 2015-09-28 ENCOUNTER — Inpatient Hospital Stay (HOSPITAL_COMMUNITY): Payer: BLUE CROSS/BLUE SHIELD | Admitting: Anesthesiology

## 2015-09-28 ENCOUNTER — Encounter (HOSPITAL_COMMUNITY)
Admission: RE | Disposition: A | Discharge status: Home / Self Care | Payer: Self-pay | Source: Ambulatory Visit | Attending: General Surgery

## 2015-09-28 ENCOUNTER — Inpatient Hospital Stay (HOSPITAL_COMMUNITY)
Admission: RE | Admit: 2015-09-28 | Discharge: 2015-10-01 | DRG: 346 | Disposition: A | Discharge status: Home / Self Care | Payer: BLUE CROSS/BLUE SHIELD | Source: Ambulatory Visit | Attending: General Surgery | Admitting: General Surgery

## 2015-09-28 ENCOUNTER — Encounter (HOSPITAL_COMMUNITY): Payer: Self-pay | Admitting: *Deleted

## 2015-09-28 DIAGNOSIS — J449 Chronic obstructive pulmonary disease, unspecified: Secondary | ICD-10-CM | POA: Diagnosis present

## 2015-09-28 DIAGNOSIS — K5732 Diverticulitis of large intestine without perforation or abscess without bleeding: Secondary | ICD-10-CM | POA: Diagnosis present

## 2015-09-28 DIAGNOSIS — K5792 Diverticulitis of intestine, part unspecified, without perforation or abscess without bleeding: Secondary | ICD-10-CM | POA: Diagnosis present

## 2015-09-28 DIAGNOSIS — F17200 Nicotine dependence, unspecified, uncomplicated: Secondary | ICD-10-CM | POA: Diagnosis present

## 2015-09-28 HISTORY — PX: PARTIAL COLECTOMY: SHX5273

## 2015-09-28 SURGERY — COLECTOMY, PARTIAL
Anesthesia: General | Site: Abdomen

## 2015-09-28 MED ORDER — ONDANSETRON HCL 4 MG/2ML IJ SOLN
4.0000 mg | Freq: Once | INTRAMUSCULAR | Status: AC
  Administered 2015-09-28: 4 mg via INTRAVENOUS

## 2015-09-28 MED ORDER — FENTANYL CITRATE (PF) 100 MCG/2ML IJ SOLN
INTRAMUSCULAR | Status: AC
  Filled 2015-09-28: qty 2

## 2015-09-28 MED ORDER — LIDOCAINE HCL (PF) 1 % IJ SOLN
INTRAMUSCULAR | Status: AC
Start: 2015-09-28 — End: 2015-09-28
  Filled 2015-09-28: qty 5

## 2015-09-28 MED ORDER — FENTANYL CITRATE (PF) 100 MCG/2ML IJ SOLN
INTRAMUSCULAR | Status: DC | PRN
  Administered 2015-09-28: 25 ug via INTRAVENOUS

## 2015-09-28 MED ORDER — DEXAMETHASONE SODIUM PHOSPHATE 4 MG/ML IJ SOLN
4.0000 mg | Freq: Once | INTRAMUSCULAR | Status: AC
  Administered 2015-09-28: 4 mg via INTRAVENOUS

## 2015-09-28 MED ORDER — ROCURONIUM BROMIDE 50 MG/5ML IV SOLN
INTRAVENOUS | Status: AC
  Filled 2015-09-28: qty 1

## 2015-09-28 MED ORDER — FENTANYL CITRATE (PF) 100 MCG/2ML IJ SOLN
INTRAMUSCULAR | Status: AC
Start: 2015-09-28 — End: 2015-09-28
  Filled 2015-09-28: qty 2

## 2015-09-28 MED ORDER — ACETAMINOPHEN 650 MG RE SUPP: 650.0000 mg | Freq: Four times a day (QID) | RECTAL | Status: DC | PRN

## 2015-09-28 MED ORDER — POVIDONE-IODINE 10 % EX OINT
TOPICAL_OINTMENT | CUTANEOUS | Status: AC
  Filled 2015-09-28: qty 1

## 2015-09-28 MED ORDER — LIDOCAINE HCL (CARDIAC) 20 MG/ML IV SOLN
INTRAVENOUS | Status: DC | PRN
  Administered 2015-09-28 (×2): 50 mg via INTRAVENOUS

## 2015-09-28 MED ORDER — ONDANSETRON HCL 4 MG/2ML IJ SOLN
4.0000 mg | Freq: Once | INTRAMUSCULAR | Status: AC | PRN
  Administered 2015-09-28: 4 mg via INTRAVENOUS
  Filled 2015-09-28: qty 2

## 2015-09-28 MED ORDER — FENOFIBRATE 54 MG PO TABS
54.0000 mg | ORAL_TABLET | Freq: Every day | ORAL | Status: DC
  Administered 2015-09-29 – 2015-09-30 (×2): 54 mg via ORAL
  Filled 2015-09-28 (×4): qty 1

## 2015-09-28 MED ORDER — PROPOFOL 10 MG/ML IV BOLUS
INTRAVENOUS | Status: AC
Start: 2015-09-28 — End: 2015-09-28
  Filled 2015-09-28: qty 40

## 2015-09-28 MED ORDER — NAPHAZOLINE-PHENIRAMINE 0.025-0.3 % OP SOLN
1.0000 [drp] | Freq: Four times a day (QID) | OPHTHALMIC | Status: DC | PRN
  Filled 2015-09-28: qty 5

## 2015-09-28 MED ORDER — ALBUTEROL SULFATE (2.5 MG/3ML) 0.083% IN NEBU: 2.5000 mg | INHALATION_SOLUTION | RESPIRATORY_TRACT | Status: DC | PRN

## 2015-09-28 MED ORDER — OXYCODONE-ACETAMINOPHEN 5-325 MG PO TABS: 1.0000 | ORAL_TABLET | ORAL | Status: DC | PRN

## 2015-09-28 MED ORDER — EPHEDRINE SULFATE 50 MG/ML IJ SOLN
INTRAMUSCULAR | Status: AC
  Filled 2015-09-28: qty 1

## 2015-09-28 MED ORDER — CHLORHEXIDINE GLUCONATE 4 % EX LIQD: 1.0000 "application " | Freq: Once | CUTANEOUS | Status: DC

## 2015-09-28 MED ORDER — ENOXAPARIN SODIUM 40 MG/0.4ML ~~LOC~~ SOLN
40.0000 mg | SUBCUTANEOUS | Status: DC
  Administered 2015-09-29 – 2015-09-30 (×2): 40 mg via SUBCUTANEOUS
  Filled 2015-09-28 (×3): qty 0.4

## 2015-09-28 MED ORDER — SUCCINYLCHOLINE CHLORIDE 20 MG/ML IJ SOLN
INTRAMUSCULAR | Status: AC
  Filled 2015-09-28: qty 3

## 2015-09-28 MED ORDER — NEOSTIGMINE METHYLSULFATE 10 MG/10ML IV SOLN
INTRAVENOUS | Status: DC | PRN
  Administered 2015-09-28: 4 mg via INTRAVENOUS

## 2015-09-28 MED ORDER — CITALOPRAM HYDROBROMIDE 20 MG PO TABS
40.0000 mg | ORAL_TABLET | Freq: Every day | ORAL | Status: DC
  Administered 2015-09-29 – 2015-09-30 (×2): 40 mg via ORAL
  Filled 2015-09-28 (×3): qty 2

## 2015-09-28 MED ORDER — DIPHENHYDRAMINE HCL 12.5 MG/5ML PO ELIX: 12.5000 mg | ORAL_SOLUTION | Freq: Four times a day (QID) | ORAL | Status: DC | PRN

## 2015-09-28 MED ORDER — METHOCARBAMOL 500 MG PO TABS: 500.0000 mg | ORAL_TABLET | Freq: Four times a day (QID) | ORAL | Status: DC | PRN

## 2015-09-28 MED ORDER — BUPIVACAINE HCL (PF) 0.5 % IJ SOLN
INTRAMUSCULAR | Status: AC
  Filled 2015-09-28: qty 30

## 2015-09-28 MED ORDER — PROPOFOL 10 MG/ML IV BOLUS
INTRAVENOUS | Status: DC | PRN
  Administered 2015-09-28: 30 mg via INTRAVENOUS
  Administered 2015-09-28: 150 mg via INTRAVENOUS

## 2015-09-28 MED ORDER — SUFENTANIL CITRATE 50 MCG/ML IV SOLN
INTRAVENOUS | Status: DC | PRN
  Administered 2015-09-28 (×4): 10 ug via INTRAVENOUS

## 2015-09-28 MED ORDER — BUPIVACAINE LIPOSOME 1.3 % IJ SUSP
INTRAMUSCULAR | Status: AC
  Filled 2015-09-28: qty 20

## 2015-09-28 MED ORDER — ALVIMOPAN 12 MG PO CAPS
12.0000 mg | ORAL_CAPSULE | Freq: Once | ORAL | Status: AC
  Administered 2015-09-28: 12 mg via ORAL
  Filled 2015-09-28: qty 1

## 2015-09-28 MED ORDER — POVIDONE-IODINE 10 % EX OINT
TOPICAL_OINTMENT | CUTANEOUS | Status: DC | PRN
Start: 2015-09-28 — End: 2015-09-28
  Administered 2015-09-28: 1 via TOPICAL

## 2015-09-28 MED ORDER — 0.9 % SODIUM CHLORIDE (POUR BTL) OPTIME
TOPICAL | Status: DC | PRN
  Administered 2015-09-28: 2000 mL

## 2015-09-28 MED ORDER — SUFENTANIL CITRATE 50 MCG/ML IV SOLN
INTRAVENOUS | Status: AC
  Filled 2015-09-28: qty 1

## 2015-09-28 MED ORDER — FENTANYL CITRATE (PF) 100 MCG/2ML IJ SOLN
25.0000 ug | INTRAMUSCULAR | Status: DC | PRN
  Administered 2015-09-28 (×4): 50 ug via INTRAVENOUS
  Filled 2015-09-28: qty 2

## 2015-09-28 MED ORDER — NEOSTIGMINE METHYLSULFATE 10 MG/10ML IV SOLN
INTRAVENOUS | Status: AC
  Filled 2015-09-28: qty 1

## 2015-09-28 MED ORDER — MIDAZOLAM HCL 2 MG/2ML IJ SOLN
1.0000 mg | INTRAMUSCULAR | Status: DC | PRN
Start: 2015-09-28 — End: 2015-09-28
  Administered 2015-09-28: 2 mg via INTRAVENOUS

## 2015-09-28 MED ORDER — LIDOCAINE HCL (PF) 1 % IJ SOLN
INTRAMUSCULAR | Status: AC
  Filled 2015-09-28: qty 5

## 2015-09-28 MED ORDER — MIDAZOLAM HCL 2 MG/2ML IJ SOLN
INTRAMUSCULAR | Status: AC
  Filled 2015-09-28: qty 2

## 2015-09-28 MED ORDER — SODIUM CHLORIDE 0.9 % IJ SOLN
INTRAMUSCULAR | Status: AC
  Filled 2015-09-28: qty 10

## 2015-09-28 MED ORDER — GLYCOPYRROLATE 0.2 MG/ML IJ SOLN
0.2000 mg | Freq: Once | INTRAMUSCULAR | Status: AC
  Administered 2015-09-28: 0.2 mg via INTRAVENOUS

## 2015-09-28 MED ORDER — METRONIDAZOLE IN NACL 5-0.79 MG/ML-% IV SOLN
500.0000 mg | INTRAVENOUS | Status: AC
  Administered 2015-09-28: 500 mg via INTRAVENOUS
  Filled 2015-09-28: qty 100

## 2015-09-28 MED ORDER — SIMETHICONE 80 MG PO CHEW: 40.0000 mg | CHEWABLE_TABLET | Freq: Four times a day (QID) | ORAL | Status: DC | PRN

## 2015-09-28 MED ORDER — ONDANSETRON HCL 4 MG/2ML IJ SOLN: 4.0000 mg | Freq: Four times a day (QID) | INTRAMUSCULAR | Status: DC | PRN

## 2015-09-28 MED ORDER — ONDANSETRON 4 MG PO TBDP: 4.0000 mg | ORAL_TABLET | Freq: Four times a day (QID) | ORAL | Status: DC | PRN

## 2015-09-28 MED ORDER — BUPIVACAINE LIPOSOME 1.3 % IJ SUSP
INTRAMUSCULAR | Status: DC | PRN
  Administered 2015-09-28: 20 mL

## 2015-09-28 MED ORDER — ACETAMINOPHEN 325 MG PO TABS: 650.0000 mg | ORAL_TABLET | Freq: Four times a day (QID) | ORAL | Status: DC | PRN

## 2015-09-28 MED ORDER — DIPHENHYDRAMINE HCL 50 MG/ML IJ SOLN: 12.5000 mg | Freq: Four times a day (QID) | INTRAMUSCULAR | Status: DC | PRN

## 2015-09-28 MED ORDER — GLYCOPYRROLATE 0.2 MG/ML IJ SOLN
INTRAMUSCULAR | Status: DC | PRN
  Administered 2015-09-28: 0.6 mg via INTRAVENOUS

## 2015-09-28 MED ORDER — ONDANSETRON HCL 4 MG/2ML IJ SOLN
INTRAMUSCULAR | Status: AC
  Filled 2015-09-28: qty 2

## 2015-09-28 MED ORDER — CIPROFLOXACIN IN D5W 400 MG/200ML IV SOLN
400.0000 mg | INTRAVENOUS | Status: AC
  Administered 2015-09-28: 400 mg via INTRAVENOUS
  Filled 2015-09-28: qty 200

## 2015-09-28 MED ORDER — DEXAMETHASONE SODIUM PHOSPHATE 4 MG/ML IJ SOLN
INTRAMUSCULAR | Status: AC
  Filled 2015-09-28: qty 1

## 2015-09-28 MED ORDER — LACTATED RINGERS IV SOLN
INTRAVENOUS | Status: DC
  Administered 2015-09-28: 1000 mL via INTRAVENOUS
  Administered 2015-09-28 (×2): via INTRAVENOUS

## 2015-09-28 MED ORDER — GLYCOPYRROLATE 0.2 MG/ML IJ SOLN
INTRAMUSCULAR | Status: AC
  Filled 2015-09-28: qty 3

## 2015-09-28 MED ORDER — HYDROMORPHONE HCL 1 MG/ML IJ SOLN
1.0000 mg | INTRAMUSCULAR | Status: DC | PRN
  Administered 2015-09-28 – 2015-09-29 (×7): 1 mg via INTRAVENOUS
  Filled 2015-09-28 (×8): qty 1

## 2015-09-28 MED ORDER — GLYCOPYRROLATE 0.2 MG/ML IJ SOLN
INTRAMUSCULAR | Status: AC
  Filled 2015-09-28: qty 1

## 2015-09-28 MED ORDER — ALVIMOPAN 12 MG PO CAPS
12.0000 mg | ORAL_CAPSULE | Freq: Two times a day (BID) | ORAL | Status: DC
  Administered 2015-09-29 (×2): 12 mg via ORAL
  Filled 2015-09-28 (×2): qty 1

## 2015-09-28 MED ORDER — ROCURONIUM BROMIDE 100 MG/10ML IV SOLN
INTRAVENOUS | Status: DC | PRN
  Administered 2015-09-28: 40 mg via INTRAVENOUS
  Administered 2015-09-28: 5 mg via INTRAVENOUS
  Administered 2015-09-28: 10 mg via INTRAVENOUS
  Administered 2015-09-28: 5 mg via INTRAVENOUS

## 2015-09-28 MED ORDER — KETOROLAC TROMETHAMINE 30 MG/ML IJ SOLN
30.0000 mg | Freq: Once | INTRAMUSCULAR | Status: AC
  Administered 2015-09-28: 30 mg via INTRAVENOUS

## 2015-09-28 MED ORDER — KETOROLAC TROMETHAMINE 30 MG/ML IJ SOLN
INTRAMUSCULAR | Status: AC
  Filled 2015-09-28: qty 1

## 2015-09-28 MED ORDER — LACTATED RINGERS IV SOLN
INTRAVENOUS | Status: DC
  Administered 2015-09-28: 1 mL via INTRAVENOUS
  Administered 2015-09-29 (×2): via INTRAVENOUS

## 2015-09-28 SURGICAL SUPPLY — 82 items
APPLIER CLIP 11 MED OPEN (CLIP)
APPLIER CLIP 13 LRG OPEN (CLIP)
APR CLP LRG 13 20 CLIP (CLIP)
APR CLP MED 11 20 MLT OPN (CLIP)
BAG HAMPER (MISCELLANEOUS) ×2 IMPLANT
BARRIER SKIN 2 3/4 (OSTOMY) IMPLANT
BARRIER SKIN OD2.25 2 3/4 FLNG (OSTOMY) IMPLANT
BRR SKN FLT 2.75X2.25 2 PC (OSTOMY)
CELLS DAT CNTRL 66122 CELL SVR (MISCELLANEOUS) ×1 IMPLANT
CHLORAPREP W/TINT 26ML (MISCELLANEOUS) ×2 IMPLANT
CLAMP POUCH DRAINAGE QUIET (OSTOMY) IMPLANT
CLIP APPLIE 11 MED OPEN (CLIP) IMPLANT
CLIP APPLIE 13 LRG OPEN (CLIP) IMPLANT
CLOTH BEACON ORANGE TIMEOUT ST (SAFETY) ×2 IMPLANT
COVER LIGHT HANDLE STERIS (MISCELLANEOUS) ×4 IMPLANT
COVER MAYO STAND XLG (DRAPE) ×2 IMPLANT
DRAPE UTILITY W/TAPE 26X15 (DRAPES) ×4 IMPLANT
DRAPE WARM FLUID 44X44 (DRAPE) ×2 IMPLANT
DRSG OPSITE POSTOP 4X10 (GAUZE/BANDAGES/DRESSINGS) ×1 IMPLANT
DRSG OPSITE POSTOP 4X8 (GAUZE/BANDAGES/DRESSINGS) ×2 IMPLANT
ELECT BLADE 6 FLAT ULTRCLN (ELECTRODE) ×2 IMPLANT
ELECT REM PT RETURN 9FT ADLT (ELECTROSURGICAL) ×2
ELECTRODE REM PT RTRN 9FT ADLT (ELECTROSURGICAL) ×1 IMPLANT
FORMALIN 10 PREFIL 480ML (MISCELLANEOUS) IMPLANT
GLOVE BIO SURGEON STRL SZ7 (GLOVE) ×1 IMPLANT
GLOVE BIOGEL PI IND STRL 7.0 (GLOVE) ×1 IMPLANT
GLOVE BIOGEL PI INDICATOR 7.0 (GLOVE) ×3
GLOVE ECLIPSE 6.5 STRL STRAW (GLOVE) ×1 IMPLANT
GLOVE EXAM NITRILE MD LF STRL (GLOVE) ×1 IMPLANT
GLOVE SURG SS PI 7.5 STRL IVOR (GLOVE) ×6 IMPLANT
GOWN STRL REUS W/ TWL XL LVL3 (GOWN DISPOSABLE) ×2 IMPLANT
GOWN STRL REUS W/TWL LRG LVL3 (GOWN DISPOSABLE) ×8 IMPLANT
GOWN STRL REUS W/TWL XL LVL3 (GOWN DISPOSABLE) ×4
HANDLE SUCTION POOLE (INSTRUMENTS) IMPLANT
INST SET MAJOR GENERAL (KITS) ×2 IMPLANT
KIT BLADEGUARD II DBL (SET/KITS/TRAYS/PACK) ×2 IMPLANT
KIT ROOM TURNOVER APOR (KITS) ×2 IMPLANT
LIGASURE IMPACT 36 18CM CVD LR (INSTRUMENTS) ×2 IMPLANT
MANIFOLD NEPTUNE II (INSTRUMENTS) ×2 IMPLANT
NDL HYPO 18GX1.5 BLUNT FILL (NEEDLE) ×1 IMPLANT
NDL HYPO 21X1.5 SAFETY (NEEDLE) ×1 IMPLANT
NEEDLE HYPO 18GX1.5 BLUNT FILL (NEEDLE) ×2 IMPLANT
NEEDLE HYPO 21X1.5 SAFETY (NEEDLE) ×2 IMPLANT
NS IRRIG 1000ML POUR BTL (IV SOLUTION) ×4 IMPLANT
PACK ABDOMINAL MAJOR (CUSTOM PROCEDURE TRAY) ×2 IMPLANT
PAD ARMBOARD 7.5X6 YLW CONV (MISCELLANEOUS) ×2 IMPLANT
PENCIL HANDSWITCHING (ELECTRODE) ×2 IMPLANT
POUCH OSTOMY 2 3/4  H 3804 (WOUND CARE)
POUCH OSTOMY 2 3/4 H 3804 (WOUND CARE)
POUCH OSTOMY 2 PC DRNBL 2.25 (WOUND CARE) IMPLANT
POUCH OSTOMY 2 PC DRNBL 2.75 (WOUND CARE) IMPLANT
POUCH OSTOMY DRNBL 2 1/4 (WOUND CARE)
RELOAD LINEAR CUT PROX 55 BLUE (ENDOMECHANICALS) IMPLANT
RELOAD PROXIMATE 75MM BLUE (ENDOMECHANICALS) ×4 IMPLANT
RELOAD STAPLE 55 3.8 BLU REG (ENDOMECHANICALS) IMPLANT
RELOAD STAPLE 75 3.8 BLU REG (ENDOMECHANICALS) IMPLANT
RETRACTOR WND ALEXIS 18 MED (MISCELLANEOUS) IMPLANT
RETRACTOR WND ALEXIS 25 LRG (MISCELLANEOUS) IMPLANT
RTRCTR WOUND ALEXIS 18CM MED (MISCELLANEOUS) ×2
RTRCTR WOUND ALEXIS 25CM LRG (MISCELLANEOUS)
SET BASIN LINEN APH (SET/KITS/TRAYS/PACK) ×2 IMPLANT
SPONGE LAP 18X18 X RAY DECT (DISPOSABLE) IMPLANT
STAPLER GUN LINEAR PROX 60 (STAPLE) ×1 IMPLANT
STAPLER PROXIMATE 55 BLUE (STAPLE) IMPLANT
STAPLER PROXIMATE 75MM BLUE (STAPLE) ×1 IMPLANT
STAPLER VISISTAT (STAPLE) ×1 IMPLANT
SUCTION POOLE HANDLE (INSTRUMENTS) ×2
SUCTION POOLE TIP (SUCTIONS) ×4 IMPLANT
SUT CHROMIC 0 SH (SUTURE) IMPLANT
SUT CHROMIC 2 0 SH (SUTURE) IMPLANT
SUT CHROMIC 3 0 SH 27 (SUTURE) IMPLANT
SUT NOVA NAB GS-26 0 60 (SUTURE) IMPLANT
SUT PDS AB 0 CTX 60 (SUTURE) ×2 IMPLANT
SUT SILK 2 0 (SUTURE)
SUT SILK 2 0 REEL (SUTURE) IMPLANT
SUT SILK 2-0 18XBRD TIE 12 (SUTURE) IMPLANT
SUT SILK 3 0 SH CR/8 (SUTURE) ×3 IMPLANT
SYR 20CC LL (SYRINGE) ×2 IMPLANT
TOWEL BLUE STERILE X RAY DET (MISCELLANEOUS) ×2 IMPLANT
TOWEL OR 17X26 4PK STRL BLUE (TOWEL DISPOSABLE) ×1 IMPLANT
TRAY FOLEY CATH SILVER 16FR (SET/KITS/TRAYS/PACK) ×2 IMPLANT
YANKAUER SUCT BULB TIP NO VENT (SUCTIONS) ×2 IMPLANT

## 2015-09-28 NOTE — Anesthesia Preprocedure Evaluation (Signed)
Anesthesia Evaluation  Patient identified by MRN, date of birth, ID band Patient awake    Reviewed: Allergy & Precautions, NPO status , Patient's Chart, lab work & pertinent test results  Airway Mallampati: II  TM Distance: >3 FB     Dental  (+) Teeth Intact, Dental Advisory Given   Pulmonary Current Smoker,    breath sounds clear to auscultation       Cardiovascular negative cardio ROS   Rhythm:Regular Rate:Normal     Neuro/Psych PSYCHIATRIC DISORDERS Anxiety Depression    GI/Hepatic Diverticulitis    Endo/Other    Renal/GU      Musculoskeletal   Abdominal   Peds  Hematology   Anesthesia Other Findings   Reproductive/Obstetrics                             Anesthesia Physical Anesthesia Plan  ASA: II  Anesthesia Plan: General   Post-op Pain Management:    Induction: Intravenous  Airway Management Planned: Oral ETT  Additional Equipment:   Intra-op Plan:   Post-operative Plan: Extubation in OR  Informed Consent: I have reviewed the patients History and Physical, chart, labs and discussed the procedure including the risks, benefits and alternatives for the proposed anesthesia with the patient or authorized representative who has indicated his/her understanding and acceptance.     Plan Discussed with:   Anesthesia Plan Comments:         Anesthesia Quick Evaluation

## 2015-09-28 NOTE — Transfer of Care (Signed)
Immediate Anesthesia Transfer of Care Note  Patient: Devin Ryan  Procedure(s) Performed: Procedure(s): PARTIAL COLECTOMY (N/A)  Patient Location: PACU  Anesthesia Type:General  Level of Consciousness: awake, alert , oriented and patient cooperative  Airway & Oxygen Therapy: Patient Spontanous Breathing and Patient connected to face mask oxygen  Post-op Assessment: Report given to RN and Post -op Vital signs reviewed and stable  Post vital signs: Reviewed and stable  Last Vitals:  Filed Vitals:   09/28/15 0700 09/28/15 0715  BP: 114/71 111/73  Pulse:    Temp:    Resp: 22 22    Complications: No apparent anesthesia complications

## 2015-09-28 NOTE — Interval H&P Note (Signed)
History and Physical Interval Note:  09/28/2015 7:13 AM  Devin Ryan  has presented today for surgery, with the diagnosis of diverticulitis  The various methods of treatment have been discussed with the patient and family. After consideration of risks, benefits and other options for treatment, the patient has consented to  Procedure(s): PARTIAL COLECTOMY (N/A) as a surgical intervention .  The patient's history has been reviewed, patient examined, no change in status, stable for surgery.  I have reviewed the patient's chart and labs.  Questions were answered to the patient's satisfaction.     Aviva Signs A

## 2015-09-28 NOTE — Anesthesia Procedure Notes (Signed)
Procedure Name: Intubation Date/Time: 09/28/2015 7:40 AM Performed by: Andree Elk, AMY A Pre-anesthesia Checklist: Patient identified, Patient being monitored, Timeout performed, Emergency Drugs available and Suction available Patient Re-evaluated:Patient Re-evaluated prior to inductionOxygen Delivery Method: Circle System Utilized Preoxygenation: Pre-oxygenation with 100% oxygen Intubation Type: IV induction Ventilation: Mask ventilation without difficulty Laryngoscope Size: 3 and Miller Grade View: Grade I Tube type: Oral Tube size: 7.0 mm Number of attempts: 1 Airway Equipment and Method: Stylet Placement Confirmation: ETT inserted through vocal cords under direct vision,  positive ETCO2 and breath sounds checked- equal and bilateral Secured at: 21 cm Tube secured with: Tape Dental Injury: Teeth and Oropharynx as per pre-operative assessment

## 2015-09-28 NOTE — Op Note (Signed)
Patient:  Devin Ryan  DOB:  [birth year]  MRN:  [id number]   Preop Diagnosis:  Recurrent sigmoid diverticulitis  Postop Diagnosis:  Same  Procedure:  Partial colectomy  Surgeon:  Aviva Signs, M.D.  Anes:  Gen. endotracheal  Indications:  Patient is a 49 year old white male who presents with recurrent episodes of sigmoid diverticulitis. The risks and benefits of the procedure including bleeding, infection, cardiopulmonary difficulties, and the possibility of a blood transfusion were fully explained to the patient, who gave informed consent.  Procedure note:  The patient was placed the supine position. After induction of general endotracheal anesthesia, the abdomen was prepped and draped using the usual sterile technique with ChloraPrep. Surgical site confirmation was performed.  A lower midline incision was made into the peritoneal cavity without difficulty. On inspection, the patient had the midportion of the sigmoid colon with significant induration and scarring with attachment to the left anterior abdominal wall. This was freed away both bluntly and using the LigaSure. There was attached somewhat to the bladder, but this was freed away without difficulty.  No injury to the bladder was noted. The dissection was taken down to the base of the mesentery. The sigmoid colon mesentery was incised along its peritoneal reflection. This was taken up to the descending colon. Care was taken to avoid the left ureter. A GIA 75 stapler was laced proximally distally on the sigmoid colon and fired. The mesentery was divided using the LigaSure. The specimen was removed from the operative field and sent to pathology for further examination. A side to side colon anastomosis was performed using a GIA-75 stapler. The colotomy was closed using a TA 60 stapler. The staple line was bolstered using 3-0 silk sutures. Surrounding adipose tissue was placed over the anastomosis and secured using 3-0 silk sutures. A widely  patent anastomosis was noted. The pelvis was then copiously irrigated normal saline. No abnormal bleeding was noted at the end of the procedure. The bowel was returned into the abdominal cavity in an orderly fashion. All operating personnel changed their gowns and gloves. A new setup was then used for closure.  The fascia was reapproximated using looped 0 PDS running suture. The subcutaneous layer was irrigated normal saline. Exaprel was instilled into the surrounding wound. The skin was closed using staples. Betadine ointment and dry sterile dressings were applied.  All tape and needle counts were correct the end of the procedure. The patient was extubated in the operating room and transferred to PACU in stable condition.  Complications:  None  EBL:  100 mL  Specimen:  Sigmoid colon

## 2015-09-28 NOTE — Anesthesia Postprocedure Evaluation (Signed)
Anesthesia Post Note  Patient: Devin Ryan  Procedure(s) Performed: Procedure(s) (LRB): PARTIAL COLECTOMY (N/A)  Patient location during evaluation: PACU Anesthesia Type: General Level of consciousness: awake and alert and oriented Pain management: pain level controlled Vital Signs Assessment: post-procedure vital signs reviewed and stable Respiratory status: spontaneous breathing and respiratory function stable Cardiovascular status: stable Anesthetic complications: no    Last Vitals:  Filed Vitals:   09/28/15 0715 09/28/15 0915  BP: 111/73 128/90  Pulse:    Temp:  37.1 C  Resp: 22 20    Last Pain: There were no vitals filed for this visit.               ADAMS, AMY A

## 2015-09-29 ENCOUNTER — Encounter (HOSPITAL_COMMUNITY): Payer: Self-pay | Admitting: General Surgery

## 2015-09-29 LAB — BASIC METABOLIC PANEL
Anion gap: 8 (ref 5–15)
BUN: 15 mg/dL (ref 6–20)
CO2: 28 mmol/L (ref 22–32)
Calcium: 8.9 mg/dL (ref 8.9–10.3)
Chloride: 98 mmol/L — ABNORMAL LOW (ref 101–111)
Creatinine, Ser: 1.1 mg/dL (ref 0.61–1.24)
GFR calc Af Amer: >60 mL/min (ref >60)
GLUCOSE: 103 mg/dL — AB (ref 65–99)
POTASSIUM: 4 mmol/L (ref 3.5–5.1)
Sodium: 134 mmol/L — ABNORMAL LOW (ref 135–145)

## 2015-09-29 LAB — CBC
HEMATOCRIT: 38.2 % — AB (ref 39.0–52.0)
Hemoglobin: 13 g/dL (ref 13.0–17.0)
MCH: 31.3 pg (ref 26.0–34.0)
MCHC: 34 g/dL (ref 30.0–36.0)
MCV: 91.8 fL (ref 78.0–100.0)
PLATELETS: 278 10*3/uL (ref 150–400)
RBC: 4.16 MIL/uL — AB (ref 4.22–5.81)
RDW: 12.4 % (ref 11.5–15.5)
WBC: 13.9 10*3/uL — ABNORMAL HIGH (ref 4.0–10.5)

## 2015-09-29 LAB — MAGNESIUM: Magnesium: 1.8 mg/dL (ref 1.7–2.4)

## 2015-09-29 LAB — PHOSPHORUS: Phosphorus: 3.5 mg/dL (ref 2.5–4.6)

## 2015-09-29 MED ORDER — HYDROMORPHONE HCL 1 MG/ML IJ SOLN
2.0000 mg | INTRAMUSCULAR | Status: DC | PRN
  Administered 2015-09-29 – 2015-10-01 (×13): 2 mg via INTRAVENOUS
  Filled 2015-09-29 (×13): qty 2

## 2015-09-29 NOTE — Progress Notes (Signed)
1 Day Post-Op  Subjective: Moderate incisional pain. States he has passed flatus.  Objective: Vital signs in last 24 hours: Temp:  [97.4 F (36.3 C)-98.7 F (37.1 C)] 98.2 F (36.8 C) (12/20 0430) Pulse Rate:  [57-83] 57 (12/20 0430) Resp:  [12-20] 18 (12/20 0430) BP: (113-138)/(68-90) 113/70 mmHg (12/20 0430) SpO2:  [94 %-100 %] 97 % (12/20 0430) Weight:  [98.884 kg (218 lb)] 98.884 kg (218 lb) (12/19 1245) Last BM Date: 09/28/15  Intake/Output from previous day: 12/19 0701 - 12/20 0700 In: 3200 [I.V.:3200] Out: 1650 [Urine:1550; Blood:100] Intake/Output this shift:    General appearance: alert, cooperative and no distress Resp: clear to auscultation bilaterally Cardio: regular rate and rhythm, S1, S2 normal, no murmur, click, rub or gallop GI: Soft, incision healing well.  Lab Results:   Recent Labs  09/29/15 0615  WBC 13.9*  HGB 13.0  HCT 38.2*  PLT 278   BMET  Recent Labs  09/29/15 0615  NA 134*  K 4.0  CL 98*  CO2 28  GLUCOSE 103*  BUN 15  CREATININE 1.10  CALCIUM 8.9   PT/INR No results for input(s): LABPROT, INR in the last 72 hours.  Studies/Results: No results found.  Anti-infectives: Anti-infectives    Start     Dose/Rate Route Frequency Ordered Stop   09/28/15 0612  metroNIDAZOLE (FLAGYL) IVPB 500 mg     500 mg 100 mL/hr over 60 Minutes Intravenous On call to O.R. 09/28/15 0612 09/28/15 0755   09/28/15 0612  ciprofloxacin (CIPRO) IVPB 400 mg     400 mg 200 mL/hr over 60 Minutes Intravenous On call to O.R. 09/28/15 0612 09/28/15 0728      Assessment/Plan: s/p Procedure(s): PARTIAL COLECTOMY Impression: Stable on postoperative day 1. We will advance to full liquid diet. We'll remove Foley catheter. Adjust IV fluids. Awaiting full return of bowel function.  LOS: 1 day    Devin Ryan A 09/29/2015

## 2015-09-29 NOTE — Addendum Note (Signed)
Addendum  created 09/29/15 0737 by Mickel Baas, CRNA   Modules edited: Clinical Notes   Clinical Notes:  File: [id number]

## 2015-09-29 NOTE — Anesthesia Postprocedure Evaluation (Signed)
Anesthesia Post Note  Patient: Devin Ryan  Procedure(s) Performed: Procedure(s) (LRB): PARTIAL COLECTOMY (N/A)  Patient location during evaluation: Nursing Unit Anesthesia Type: General Level of consciousness: awake and alert and oriented Pain management: pain level controlled Vital Signs Assessment: post-procedure vital signs reviewed and stable Respiratory status: spontaneous breathing and respiratory function stable Cardiovascular status: stable Postop Assessment: no signs of nausea or vomiting Anesthetic complications: no    Last Vitals:  Filed Vitals:   09/29/15 0029 09/29/15 0430  BP: 122/70 113/70  Pulse: 60 57  Temp: 37 C 36.8 C  Resp: 18 18    Last Pain:  Filed Vitals:   09/29/15 0437  PainSc: Asleep                 ADAMS, AMY A

## 2015-09-30 LAB — CBC
HEMATOCRIT: 37.2 % — AB (ref 39.0–52.0)
Hemoglobin: 12.5 g/dL — ABNORMAL LOW (ref 13.0–17.0)
MCH: 30.8 pg (ref 26.0–34.0)
MCHC: 33.6 g/dL (ref 30.0–36.0)
MCV: 91.6 fL (ref 78.0–100.0)
Platelets: 254 10*3/uL (ref 150–400)
RBC: 4.06 MIL/uL — AB (ref 4.22–5.81)
RDW: 12.2 % (ref 11.5–15.5)
WBC: 10.4 10*3/uL (ref 4.0–10.5)

## 2015-09-30 LAB — PHOSPHORUS: Phosphorus: 3.3 mg/dL (ref 2.5–4.6)

## 2015-09-30 LAB — BASIC METABOLIC PANEL
ANION GAP: 8 (ref 5–15)
BUN: 11 mg/dL (ref 6–20)
CHLORIDE: 97 mmol/L — AB (ref 101–111)
CO2: 29 mmol/L (ref 22–32)
Calcium: 9 mg/dL (ref 8.9–10.3)
Creatinine, Ser: 1.01 mg/dL (ref 0.61–1.24)
GFR calc Af Amer: >60 mL/min (ref >60)
GFR calc non Af Amer: >60 mL/min (ref >60)
GLUCOSE: 93 mg/dL (ref 65–99)
POTASSIUM: 3.9 mmol/L (ref 3.5–5.1)
Sodium: 134 mmol/L — ABNORMAL LOW (ref 135–145)

## 2015-09-30 LAB — MAGNESIUM: Magnesium: 2 mg/dL (ref 1.7–2.4)

## 2015-09-30 NOTE — Care Management Note (Signed)
Case Management Note  Patient Details  Name: Devin Ryan MRN: [id number] Date of Birth: 1965/12/16  Subjective/Objective:                  Pt admitted s/p partial colectomy. Pt lives alone and will return home at discharge. Pt stated his cousin will be available to assist pt as needed at discharge. Pt is independent with ADL's.  Action/Plan: No CM needs anticipated.  Expected Discharge Date:  10/02/15               Expected Discharge Plan:  Home/Self Care  In-House Referral:  NA  Discharge planning Services  CM Consult  Post Acute Care Choice:  NA Choice offered to:  NA  DME Arranged:    DME Agency:     HH Arranged:    HH Agency:     Status of Service:  Completed, signed off  Medicare Important Message Given:    Date Medicare IM Given:    Medicare IM give by:    Date Additional Medicare IM Given:    Additional Medicare Important Message give by:     If discussed at [place] of Stay Meetings, dates discussed:    Additional Comments:  Joylene Draft, RN 09/30/2015, 10:16 AM

## 2015-09-30 NOTE — Progress Notes (Signed)
2 Days Post-Op  Subjective: Tolerating full liquid diet well.  Objective: Vital signs in last 24 hours: Temp:  [98 F (36.7 C)-98.6 F (37 C)] 98.6 F (37 C) (12/21 [id number]) Pulse Rate:  [65-75] 75 (12/21 0623) Resp:  [18-20] 20 (12/21 0623) BP: (107-114)/(47-67) 107/47 mmHg (12/21 0623) SpO2:  [96 %-99 %] 99 % (12/21 0623) Last BM Date: 09/29/15  Intake/Output from previous day: 12/20 0701 - 12/21 0700 In: 840 [P.O.:840] Out: 1900 [Urine:1900] Intake/Output this shift:    General appearance: alert, cooperative and no distress Resp: clear to auscultation bilaterally Cardio: regular rate and rhythm, S1, S2 normal, no murmur, click, rub or gallop GI: Soft, incision healing well. Bowel sounds active.  Lab Results:   Recent Labs  09/29/15 0615 09/30/15 0546  WBC 13.9* 10.4  HGB 13.0 12.5*  HCT 38.2* 37.2*  PLT 278 254   BMET  Recent Labs  09/29/15 0615 09/30/15 0546  NA 134* 134*  K 4.0 3.9  CL 98* 97*  CO2 28 29  GLUCOSE 103* 93  BUN 15 11  CREATININE 1.10 1.01  CALCIUM 8.9 9.0   PT/INR No results for input(s): LABPROT, INR in the last 72 hours.  Studies/Results: No results found.  Anti-infectives: Anti-infectives    Start     Dose/Rate Route Frequency Ordered Stop   09/28/15 0612  metroNIDAZOLE (FLAGYL) IVPB 500 mg     500 mg 100 mL/hr over 60 Minutes Intravenous On call to O.R. 09/28/15 0612 09/28/15 0755   09/28/15 0612  ciprofloxacin (CIPRO) IVPB 400 mg     400 mg 200 mL/hr over 60 Minutes Intravenous On call to O.R. 09/28/15 0612 09/28/15 0728      Assessment/Plan: s/p Procedure(s): PARTIAL COLECTOMY Impression: Patient doing very well. Bowel function has returned. We'll advance to regular diet. Anticipate discharge in next 24-48 hours.  LOS: 2 days    Jovan Colligan A 09/30/2015

## 2015-09-30 NOTE — Plan of Care (Signed)
Problem: Pain Managment: Goal: General experience of comfort will improve Outcome: Progressing Pt is tolerating pain well with IV pain medications  Problem: Nutrition: Goal: Adequate nutrition will be maintained Outcome: Progressing Pt is tolerating regular diet well.  Problem: Bowel/Gastric: Goal: Will not experience complications related to bowel motility Outcome: Completed/Met Date Met:  09/30/15 Pt is having bowel movements.

## 2015-10-01 MED ORDER — OXYCODONE-ACETAMINOPHEN 7.5-325 MG PO TABS
1.0000 | ORAL_TABLET | ORAL | Status: DC | PRN
Start: 2015-10-01 — End: 2016-09-21

## 2015-10-01 NOTE — Discharge Instructions (Signed)
Open Colectomy, Care After °Refer to this sheet in the next few weeks. These instructions provide you with information on caring for yourself after your procedure. Your health care provider may also give you more specific instructions. Your treatment has been planned according to current medical practices, but problems sometimes occur. Call your health care provider if you have any problems or questions after your procedure. °WHAT TO EXPECT AFTER THE PROCEDURE °After your procedure, it is typical to have the following: °· Pain in your abdomen, especially along your incision. You will be given medicines to control the pain. °· Tiredness. This is a normal part of the recovery process. Your energy level will return to normal over the next several weeks. °· Constipation. You may be given a stool softener to prevent this. °HOME CARE INSTRUCTIONS °· Only take over-the-counter or prescription medicines as directed by your health care provider. °· Ask your health care provider whether you may take a shower when you go home. °· You may resume a normal diet and activities as directed. Eat plenty of fruits and vegetables to help prevent constipation. °· Drink enough fluids to keep your urine clear or pale yellow. This also helps prevent constipation. °· Take rest breaks during the day as needed. °· Avoid lifting anything heavier than 25 pounds (11.3 kg) or driving for 4 weeks or until your health care provider says it is okay. °· Follow up with your health care provider as directed. Ask your health care provider when you need to return to have your stitches or staples removed. °SEEK MEDICAL CARE IF: °· You have redness, swelling, or increasing pain in the incision area. °· You see pus coming from the incision area. °· You have a fever. °SEEK IMMEDIATE MEDICAL CARE IF:  °· You have chest pain or shortness of breath. °· You have pain or swelling in your legs. °· You have persistent nausea and vomiting. °· Your wound breaks open  after stitches or staples have been removed. °· You have increasing abdominal pain that is not relieved with medicine. °  °This information is not intended to replace advice given to you by your health care provider. Make sure you discuss any questions you have with your health care provider. °  °Document Released: 04/19/2011 Document Revised: 07/17/2013 Document Reviewed: 05/08/2013 °Elsevier Interactive Patient Education ©2016 Elsevier Inc. ° °

## 2015-10-01 NOTE — Progress Notes (Signed)
Discharged PT per MD order and protocol. Reviewed discharge teaching and handouts given. Prescriptions explained and given.  Pt verbalized understanding and left with all belongings. VSS. IV catheter D/C.  Patient wheeled down by staff member.  Oswald Hillock, RN

## 2015-10-01 NOTE — Care Management Note (Signed)
Case Management Note  Patient Details  Name: BRIGG MACKLER MRN: [id number] Date of Birth: 04-Jan-1966  Subjective/Objective:                    Action/Plan:   Expected Discharge Date:  10/02/15               Expected Discharge Plan:  Home/Self Care  In-House Referral:  NA  Discharge planning Services  CM Consult  Post Acute Care Choice:  NA Choice offered to:  NA  DME Arranged:    DME Agency:     HH Arranged:    [place] Agency:     Status of Service:  Completed, signed off  Medicare Important Message Given:    Date Medicare IM Given:    Medicare IM give by:    Date Additional Medicare IM Given:    Additional Medicare Important Message give by:     If discussed at [place] of Stay Meetings, dates discussed:    Additional Comments: Pt discharged home today. No CM needs noted. Christinia Gully Markham, RN 10/01/2015, 9:07 AM

## 2015-10-01 NOTE — Discharge Summary (Signed)
Physician Discharge Summary  Patient ID: BAYLON SALB MRN: [id number] DOB/AGE: 1966/02/09 49 y.o.  Admit date: 09/28/2015 Discharge date: 10/01/2015  Admission Diagnoses: Recurrent sigmoid diverticulitis  Discharge Diagnoses: Same Active Problems:   Diverticulitis  COPD  Discharged Condition: good  Hospital Course: Patient is a 49 year old white male who presented to the operating room for treatment of recurrent sigmoid diverticulitis. He underwent a partial colectomy on 09/28/2015. He tolerated the procedure well. His postoperative course has been unremarkable. His diet was advanced without difficulty once his bowel function returned. Final pathology revealed diverticulitis with no evidence of malignancy. He is being discharged home on 10/01/2015 in good and improving condition.  Treatments: surgery: Partial colectomy on 09/28/2015  Discharge Exam: Blood pressure 116/57, pulse 58, temperature 98 F (36.7 C), temperature source Oral, resp. rate 18, height 6' (1.829 m), weight 98.884 kg (218 lb), SpO2 93 %. General appearance: alert, cooperative and no distress Resp: clear to auscultation bilaterally Cardio: regular rate and rhythm, S1, S2 normal, no murmur, click, rub or gallop GI: Soft. Incision healing well.  Disposition: 01-Home or Self Care     Medication List    STOP taking these medications        ciprofloxacin 500 MG tablet  Commonly known as:  CIPRO     metroNIDAZOLE 500 MG tablet  Commonly known as:  FLAGYL      TAKE these medications        ALPRAZolam 0.5 MG tablet  Commonly known as:  XANAX  Take 0.5 mg by mouth 2 (two) times daily as needed. anxiety     citalopram 40 MG tablet  Commonly known as:  CELEXA  Take 40 mg by mouth daily.     Eszopiclone 3 MG Tabs  Take 3 mg by mouth at bedtime.     fenofibrate micronized 134 MG capsule  Commonly known as:  LOFIBRA  Take 134 mg by mouth daily.     ibuprofen 200 MG tablet  Commonly known as:   ADVIL,MOTRIN  Take 800 mg by mouth every 6 (six) hours as needed for moderate pain.     naphazoline-pheniramine 0.025-0.3 % ophthalmic solution  Commonly known as:  NAPHCON-A  Place 1 drop into both eyes 4 (four) times daily as needed for irritation.     oxyCODONE-acetaminophen 7.5-325 MG tablet  Commonly known as:  PERCOCET  Take 1-2 tablets by mouth every 4 (four) hours as needed.     PROAIR HFA 108 (90 BASE) MCG/ACT inhaler  Generic drug:  albuterol  Inhale 1 puff into the lungs every 6 (six) hours as needed. Shortness of breath           Follow-up Information    Follow up with Jamesetta So, MD. Schedule an appointment as soon as possible for a visit on 10/06/2015.   Specialty:  General Surgery   Contact information:   [address] [place] [id number] [phone]       Signed: Aviva Signs A 10/01/2015, 8:49 AM

## 2015-10-11 HISTORY — PX: BOWEL RESECTION: SHX1257

## 2016-08-03 ENCOUNTER — Other Ambulatory Visit: Payer: Self-pay | Admitting: *Deleted

## 2016-08-03 NOTE — Telephone Encounter (Signed)
See below

## 2016-08-03 NOTE — Telephone Encounter (Signed)
May send rx one fil of each, needs appointment

## 2016-08-03 NOTE — Telephone Encounter (Signed)
To pool so lunesta can be called into [facility]

## 2016-08-04 MED ORDER — CITALOPRAM HYDROBROMIDE 40 MG PO TABS: 40.0000 mg | ORAL_TABLET | Freq: Every day | ORAL | 0 refills | Status: DC

## 2016-08-04 MED ORDER — ESZOPICLONE 3 MG PO TABS: 3.0000 mg | ORAL_TABLET | Freq: Every day | ORAL | 0 refills | Status: DC

## 2016-08-04 MED ORDER — FENOFIBRATE MICRONIZED 134 MG PO CAPS: 134.0000 mg | ORAL_CAPSULE | Freq: Every day | ORAL | 0 refills | Status: DC

## 2016-08-04 NOTE — Telephone Encounter (Signed)
Rx sent to pharmacy   

## 2016-09-21 ENCOUNTER — Encounter: Payer: Self-pay | Admitting: Physician Assistant

## 2016-09-21 ENCOUNTER — Ambulatory Visit (INDEPENDENT_AMBULATORY_CARE_PROVIDER_SITE_OTHER): Payer: BLUE CROSS/BLUE SHIELD | Admitting: Physician Assistant

## 2016-09-21 VITALS — BP 120/64 | HR 78 | Temp 97.8°F | Ht 72.0 in | Wt 227.4 lb

## 2016-09-21 DIAGNOSIS — Z0000 Encounter for general adult medical examination without abnormal findings: Secondary | ICD-10-CM

## 2016-09-21 MED ORDER — ALPRAZOLAM 0.5 MG PO TABS: 0.5000 mg | ORAL_TABLET | Freq: Two times a day (BID) | ORAL | 0 refills | Status: DC | PRN

## 2016-09-21 MED ORDER — FLUOXETINE HCL 10 MG PO CAPS: 10.0000 mg | ORAL_CAPSULE | Freq: Every day | ORAL | 0 refills | Status: DC

## 2016-09-21 MED ORDER — ESZOPICLONE 3 MG PO TABS: 3.0000 mg | ORAL_TABLET | Freq: Every day | ORAL | 5 refills | Status: DC

## 2016-09-21 MED ORDER — FENOFIBRATE MICRONIZED 134 MG PO CAPS: 134.0000 mg | ORAL_CAPSULE | Freq: Every day | ORAL | 11 refills | Status: DC

## 2016-09-21 NOTE — Patient Instructions (Signed)
DASH Eating Plan DASH stands for "Dietary Approaches to Stop Hypertension." The DASH eating plan is a healthy eating plan that has been shown to reduce high blood pressure (hypertension). Additional health benefits may include reducing the risk of type 2 diabetes mellitus, heart disease, and stroke. The DASH eating plan may also help with weight loss. What do I need to know about the DASH eating plan? For the DASH eating plan, you will follow these general guidelines:  Choose foods with less than 150 milligrams of sodium per serving (as listed on the food label).  Use salt-free seasonings or herbs instead of table salt or sea salt.  Check with your health care provider or pharmacist before using salt substitutes.  Eat lower-sodium products. These are often labeled as "low-sodium" or "no salt added."  Eat fresh foods. Avoid eating a lot of canned foods.  Eat more vegetables, fruits, and low-fat dairy products.  Choose whole grains. Look for the word "whole" as the first word in the ingredient list.  Choose fish and skinless chicken or turkey more often than red meat. Limit fish, poultry, and meat to 6 oz (170 g) each day.  Limit sweets, desserts, sugars, and sugary drinks.  Choose heart-healthy fats.  Eat more home-cooked food and less restaurant, buffet, and fast food.  Limit fried foods.  Do not fry foods. Cook foods using methods such as baking, boiling, grilling, and broiling instead.  When eating at a restaurant, ask that your food be prepared with less salt, or no salt if possible. What foods can I eat? Seek help from a dietitian for individual calorie needs. Grains  Whole grain or whole wheat bread. Brown rice. Whole grain or whole wheat pasta. Quinoa, bulgur, and whole grain cereals. Low-sodium cereals. Corn or whole wheat flour tortillas. Whole grain cornbread. Whole grain crackers. Low-sodium crackers. Vegetables  Fresh or frozen vegetables (raw, steamed, roasted, or  grilled). Low-sodium or reduced-sodium tomato and vegetable juices. Low-sodium or reduced-sodium tomato sauce and paste. Low-sodium or reduced-sodium canned vegetables. Fruits  All fresh, canned (in natural juice), or frozen fruits. Meat and Other Protein Products  Ground beef (85% or leaner), grass-fed beef, or beef trimmed of fat. Skinless chicken or turkey. Ground chicken or turkey. Pork trimmed of fat. All fish and seafood. Eggs. Dried beans, peas, or lentils. Unsalted nuts and seeds. Unsalted canned beans. Dairy  Low-fat dairy products, such as skim or 1% milk, 2% or reduced-fat cheeses, low-fat ricotta or cottage cheese, or plain low-fat yogurt. Low-sodium or reduced-sodium cheeses. Fats and Oils  Tub margarines without trans fats. Light or reduced-fat mayonnaise and salad dressings (reduced sodium). Avocado. Safflower, olive, or canola oils. Natural peanut or almond butter. Other  Unsalted popcorn and pretzels. The items listed above may not be a complete list of recommended foods or beverages. Contact your dietitian for more options.  What foods are not recommended? Grains  White bread. White pasta. White rice. Refined cornbread. Bagels and croissants. Crackers that contain trans fat. Vegetables  Creamed or fried vegetables. Vegetables in a cheese sauce. Regular canned vegetables. Regular canned tomato sauce and paste. Regular tomato and vegetable juices. Fruits  Canned fruit in light or heavy syrup. Fruit juice. Meat and Other Protein Products  Fatty cuts of meat. Ribs, chicken wings, bacon, sausage, bologna, salami, chitterlings, fatback, hot dogs, bratwurst, and packaged luncheon meats. Salted nuts and seeds. Canned beans with salt. Dairy  Whole or 2% milk, cream, half-and-half, and cream cheese. Whole-fat or sweetened yogurt. Full-fat cheeses   or blue cheese. Nondairy creamers and whipped toppings. Processed cheese, cheese spreads, or cheese curds. Condiments  Onion and garlic  salt, seasoned salt, table salt, and sea salt. Canned and packaged gravies. Worcestershire sauce. Tartar sauce. Barbecue sauce. Teriyaki sauce. Soy sauce, including reduced sodium. Steak sauce. Fish sauce. Oyster sauce. Cocktail sauce. Horseradish. Ketchup and mustard. Meat flavorings and tenderizers. Bouillon cubes. Hot sauce. Tabasco sauce. Marinades. Taco seasonings. Relishes. Fats and Oils  Butter, stick margarine, lard, shortening, ghee, and bacon fat. Coconut, palm kernel, or palm oils. Regular salad dressings. Other  Pickles and olives. Salted popcorn and pretzels. The items listed above may not be a complete list of foods and beverages to avoid. Contact your dietitian for more information.  Where can I find more information? National Heart, Lung, and Blood Institute: www.nhlbi.nih.gov/health/health-topics/topics/dash/ This information is not intended to replace advice given to you by your health care provider. Make sure you discuss any questions you have with your health care provider. Document Released: 09/15/2011 Document Revised: 03/03/2016 Document Reviewed: 07/31/2013 Elsevier Interactive Patient Education  2017 Elsevier Inc.  

## 2016-09-21 NOTE — Progress Notes (Signed)
BP 120/64   Pulse 78   Temp 97.8 F (36.6 C) (Oral)   Ht 6' (1.829 m)   Wt 227 lb 6.4 oz (103.1 kg)   BMI 30.84 kg/m    Subjective:    Patient ID: Devin Ryan, male    DOB: May 28, 1966, 50 y.o.   MRN: [id number]  Devin Ryan is a 50 y.o. male presenting on 09/21/2016 for Annual Exam  HPI Patient here to be established as new patient at [facility].  This patient is known to me from [facility]. This patient comes in for annual well physical examination. All medications are reviewed today. There are no reports of any problems with the medications. All of the medical conditions are reviewed and updated.  Lab work is reviewed and will be ordered as medically necessary. There are no new problems reported with today's visit.  Patient reports doing well overall.   Past Medical History:  Diagnosis Date  . Anxiety   . Diverticulitis    Relevant past medical, surgical, family and social history reviewed and updated as indicated. Interim medical history since our last visit reviewed. Allergies and medications reviewed and updated.   Data reviewed from any sources in EPIC.  Review of Systems  Constitutional: Negative.  Negative for appetite change and fatigue.  HENT: Negative.   Eyes: Negative.  Negative for pain and visual disturbance.  Respiratory: Negative.  Negative for cough, chest tightness, shortness of breath and wheezing.   Cardiovascular: Negative.  Negative for chest pain, palpitations and leg swelling.  Gastrointestinal: Negative.  Negative for abdominal pain, diarrhea, nausea and vomiting.  Endocrine: Negative.   Genitourinary: Negative.   Musculoskeletal: Negative.   Skin: Negative.  Negative for color change and rash.  Neurological: Negative.  Negative for weakness, numbness and headaches.  Psychiatric/Behavioral: Negative.      Social History   Social History  . Marital status: Single    Spouse name: N/A  . Number of  children: N/A  . Years of education: N/A   Occupational History  . Not on file.   Social History Main Topics  . Smoking status: Current Every Day Smoker    Packs/day: 0.50    Years: 30.00    Types: Cigarettes  . Smokeless tobacco: Never Used  . Alcohol use Yes     Comment: occ  . Drug use: No  . Sexual activity: Yes    Birth control/ protection: None   Other Topics Concern  . Not on file   Social History Narrative  . No narrative on file    Past Surgical History:  Procedure Laterality Date  . NOSE SURGERY    . PARTIAL COLECTOMY N/A 09/28/2015   Procedure: PARTIAL COLECTOMY;  Surgeon: Aviva Signs, MD;  Location: [facility];  Service: General;  Laterality: N/A;    History reviewed. No pertinent family history.    Medication List       Accurate as of 09/21/16  4:50 PM. Always use your most recent med list.          ALPRAZolam 0.5 MG tablet Commonly known as:  XANAX Take 1 tablet (0.5 mg total) by mouth 2 (two) times daily as needed. anxiety   Eszopiclone 3 MG Tabs Take 1 tablet (3 mg total) by mouth at bedtime.   fenofibrate micronized 134 MG capsule Commonly known as:  LOFIBRA Take 1 capsule (134 mg total) by mouth daily.   FLUoxetine 10 MG capsule Commonly known as:  PROZAC Take 1 capsule (10 mg total) by mouth daily.   ibuprofen 200 MG tablet Commonly known as:  ADVIL,MOTRIN Take 800 mg by mouth every 6 (six) hours as needed for moderate pain.   PROAIR HFA 108 (90 Base) MCG/ACT inhaler Generic drug:  albuterol Inhale 1 puff into the lungs every 6 (six) hours as needed. Shortness of breath          Objective:    BP 120/64   Pulse 78   Temp 97.8 F (36.6 C) (Oral)   Ht 6' (1.829 m)   Wt 227 lb 6.4 oz (103.1 kg)   BMI 30.84 kg/m   No Known Allergies Wt Readings from Last 3 Encounters:  09/21/16 227 lb 6.4 oz (103.1 kg)  09/28/15 218 lb (98.9 kg)  09/24/15 223 lb 3.2 oz (101.2 kg)    Physical Exam  Constitutional: He appears  well-developed and well-nourished.  HENT:  Head: Normocephalic and atraumatic.  Eyes: Conjunctivae and EOM are normal. Pupils are equal, round, and reactive to light.  Neck: Normal range of motion. Neck supple.  Cardiovascular: Normal rate, regular rhythm and normal heart sounds.   Pulmonary/Chest: Effort normal and breath sounds normal.  Abdominal: Soft. Bowel sounds are normal.  Musculoskeletal: Normal range of motion.  Skin: Skin is warm and dry.        Assessment & Plan:   1. Well adult exam - CBC with Differential/Platelet - CMP14+EGFR - Lipid panel   Continue all other maintenance medications as listed above. Educational handout given for low triglycerides  Follow up plan: Return if symptoms worsen or fail to improve.  Terald Sleeper PA-C [facility] [address]  [place], [place] [address] [phone]   09/21/2016, 4:50 PM

## 2016-09-22 LAB — CBC WITH DIFFERENTIAL/PLATELET
BASOS: 1 %
Basophils Absolute: 0.1 10*3/uL (ref 0.0–0.2)
EOS (ABSOLUTE): 0.4 10*3/uL (ref 0.0–0.4)
EOS: 3 %
HEMATOCRIT: 43.7 % (ref 37.5–51.0)
HEMOGLOBIN: 15.1 g/dL (ref 13.0–17.7)
Immature Grans (Abs): 0.1 10*3/uL (ref 0.0–0.1)
Immature Granulocytes: 1 %
LYMPHS ABS: 3.6 10*3/uL — AB (ref 0.7–3.1)
Lymphs: 32 %
MCH: 31.3 pg (ref 26.6–33.0)
MCHC: 34.6 g/dL (ref 31.5–35.7)
MCV: 91 fL (ref 79–97)
MONOCYTES: 11 %
MONOS ABS: 1.3 10*3/uL — AB (ref 0.1–0.9)
NEUTROS ABS: 6 10*3/uL (ref 1.4–7.0)
Neutrophils: 52 %
Platelets: 334 10*3/uL (ref 150–379)
RBC: 4.83 x10E6/uL (ref 4.14–5.80)
RDW: 12.7 % (ref 12.3–15.4)
WBC: 11.3 10*3/uL — AB (ref 3.4–10.8)

## 2016-09-22 LAB — LIPID PANEL
CHOL/HDL RATIO: 5.8 ratio — AB (ref 0.0–5.0)
CHOLESTEROL TOTAL: 179 mg/dL (ref 100–199)
HDL: 31 mg/dL — ABNORMAL LOW (ref >39)
LDL CALC: 112 mg/dL — AB (ref 0–99)
Triglycerides: 179 mg/dL — ABNORMAL HIGH (ref 0–149)
VLDL Cholesterol Cal: 36 mg/dL (ref 5–40)

## 2016-09-22 LAB — CMP14+EGFR
A/G RATIO: 1.9 (ref 1.2–2.2)
ALBUMIN: 4.9 g/dL (ref 3.5–5.5)
ALK PHOS: 51 IU/L (ref 39–117)
ALT: 32 IU/L (ref 0–44)
AST: 27 IU/L (ref 0–40)
BUN / CREAT RATIO: 11 (ref 9–20)
BUN: 10 mg/dL (ref 6–24)
Bilirubin Total: 0.4 mg/dL (ref 0.0–1.2)
CO2: 24 mmol/L (ref 18–29)
CREATININE: 0.9 mg/dL (ref 0.76–1.27)
Calcium: 9.8 mg/dL (ref 8.7–10.2)
Chloride: 99 mmol/L (ref 96–106)
GFR calc Af Amer: 116 mL/min/{1.73_m2} (ref >59)
GFR calc non Af Amer: 101 mL/min/{1.73_m2} (ref >59)
GLOBULIN, TOTAL: 2.6 g/dL (ref 1.5–4.5)
Glucose: 75 mg/dL (ref 65–99)
POTASSIUM: 4.3 mmol/L (ref 3.5–5.2)
SODIUM: 140 mmol/L (ref 134–144)
Total Protein: 7.5 g/dL (ref 6.0–8.5)

## 2016-09-26 ENCOUNTER — Telehealth: Payer: Self-pay | Admitting: Physician Assistant

## 2016-09-26 NOTE — Telephone Encounter (Signed)
Appt made for tomorrow afternoon. Also has dry sore throat that has been going on since last Thursday, starting having fever.

## 2016-09-27 ENCOUNTER — Encounter: Payer: Self-pay | Admitting: Physician Assistant

## 2016-09-27 ENCOUNTER — Ambulatory Visit (INDEPENDENT_AMBULATORY_CARE_PROVIDER_SITE_OTHER): Payer: BLUE CROSS/BLUE SHIELD | Admitting: Physician Assistant

## 2016-09-27 VITALS — BP 125/78 | HR 87 | Temp 98.5°F | Ht 72.0 in | Wt 226.6 lb

## 2016-09-27 DIAGNOSIS — R059 Cough, unspecified: Secondary | ICD-10-CM

## 2016-09-27 DIAGNOSIS — R42 Dizziness and giddiness: Secondary | ICD-10-CM | POA: Diagnosis not present

## 2016-09-27 DIAGNOSIS — R05 Cough: Secondary | ICD-10-CM | POA: Diagnosis not present

## 2016-09-27 DIAGNOSIS — J0120 Acute ethmoidal sinusitis, unspecified: Secondary | ICD-10-CM

## 2016-09-27 MED ORDER — AZITHROMYCIN 250 MG PO TABS: ORAL_TABLET | ORAL | 0 refills | Status: DC

## 2016-09-27 MED ORDER — HYDROCOD POLST-CPM POLST ER 10-8 MG/5ML PO SUER: 5.0000 mL | Freq: Two times a day (BID) | ORAL | 0 refills | Status: DC | PRN

## 2016-09-27 MED ORDER — MECLIZINE HCL 25 MG PO TABS: 25.0000 mg | ORAL_TABLET | Freq: Three times a day (TID) | ORAL | 0 refills | Status: DC | PRN

## 2016-09-27 MED ORDER — METHYLPREDNISOLONE ACETATE 80 MG/ML IJ SUSP
80.0000 mg | Freq: Once | INTRAMUSCULAR | Status: AC
Start: 2016-09-27 — End: 2016-09-27
  Administered 2016-09-27: 80 mg via INTRAMUSCULAR

## 2016-09-27 NOTE — Telephone Encounter (Signed)
Patient has appointment today @ 3:10pm.

## 2016-09-27 NOTE — Patient Instructions (Signed)

## 2016-09-27 NOTE — Telephone Encounter (Signed)
He was prescribed Prozac to get off the celexa. He can get OTC dramamine and take one tablet twice daily for 1-2 weeks while stopping the celexa.

## 2016-09-29 ENCOUNTER — Encounter: Payer: Self-pay | Admitting: *Deleted

## 2016-09-29 NOTE — Progress Notes (Signed)
BP 125/78   Pulse 87   Temp 98.5 F (36.9 C) (Oral)   Ht 6' (1.829 m)   Wt 226 lb 9.6 oz (102.8 kg)   BMI 30.73 kg/m    Subjective:    Patient ID: Devin Ryan, male    DOB: 1966-08-23, 50 y.o.   MRN: [id number]  HPI: Devin Ryan is a 50 y.o. male presenting on 09/27/2016 for Dizziness (from weaning off of celexa and going on prozac ); Sinusitis; Sore Throat; and Tinnitus Several days of congestion, drainage and headache. Has very harsh cough and worse at night. Some chills. Mucus is green in color. Associated dizziness from head being so full.  Worse with movement.  Relevant past medical, surgical, family and social history reviewed and updated as indicated. Allergies and medications reviewed and updated.  Past Medical History:  Diagnosis Date  . Anxiety   . Diverticulitis     Past Surgical History:  Procedure Laterality Date  . NOSE SURGERY    . PARTIAL COLECTOMY N/A 09/28/2015   Procedure: PARTIAL COLECTOMY;  Surgeon: Aviva Signs, MD;  Location: [facility];  Service: General;  Laterality: N/A;    Review of Systems  Constitutional: Positive for fatigue. Negative for appetite change.  HENT: Positive for congestion, sinus pain, sinus pressure and sore throat. Negative for facial swelling.   Eyes: Negative.  Negative for pain and visual disturbance.  Respiratory: Positive for shortness of breath and wheezing. Negative for cough and chest tightness.   Cardiovascular: Negative.  Negative for chest pain, palpitations and leg swelling.  Gastrointestinal: Negative.  Negative for abdominal pain, diarrhea, nausea and vomiting.  Endocrine: Negative.   Genitourinary: Negative.   Musculoskeletal: Positive for back pain and myalgias.  Skin: Negative.  Negative for color change and rash.  Neurological: Positive for dizziness and headaches. Negative for weakness and numbness.  Psychiatric/Behavioral: Negative.     Allergies as of 09/27/2016   No Known Allergies     Medication  List       Accurate as of 09/27/16 11:59 PM. Always use your most recent med list.          ALPRAZolam 0.5 MG tablet Commonly known as:  XANAX Take 1 tablet (0.5 mg total) by mouth 2 (two) times daily as needed. anxiety   azithromycin 250 MG tablet Commonly known as:  ZITHROMAX Z-PAK As directed   chlorpheniramine-HYDROcodone 10-8 MG/5ML Suer Commonly known as:  TUSSIONEX Take 5 mLs by mouth every 12 (twelve) hours as needed for cough.   Eszopiclone 3 MG Tabs Take 1 tablet (3 mg total) by mouth at bedtime.   fenofibrate micronized 134 MG capsule Commonly known as:  LOFIBRA Take 1 capsule (134 mg total) by mouth daily.   ibuprofen 200 MG tablet Commonly known as:  ADVIL,MOTRIN Take 800 mg by mouth every 6 (six) hours as needed for moderate pain.   meclizine 25 MG tablet Commonly known as:  ANTIVERT Take 1 tablet (25 mg total) by mouth 3 (three) times daily as needed for dizziness.   PROAIR HFA 108 (90 Base) MCG/ACT inhaler Generic drug:  albuterol Inhale 1 puff into the lungs every 6 (six) hours as needed. Shortness of breath          Objective:    BP 125/78   Pulse 87   Temp 98.5 F (36.9 C) (Oral)   Ht 6' (1.829 m)   Wt 226 lb 9.6 oz (102.8 kg)   BMI 30.73 kg/m   No Known  Allergies  Physical Exam  Constitutional: He is oriented to person, place, and time. He appears well-developed and well-nourished.  HENT:  Head: Normocephalic and atraumatic.  Right Ear: Tympanic membrane and external ear normal. No middle ear effusion.  Left Ear: Tympanic membrane and external ear normal.  No middle ear effusion.  Nose: Mucosal edema and rhinorrhea present. Right sinus exhibits no maxillary sinus tenderness. Left sinus exhibits no maxillary sinus tenderness.  Mouth/Throat: Uvula is midline. Posterior oropharyngeal erythema present.  Eyes: Conjunctivae and EOM are normal. Pupils are equal, round, and reactive to light. Right eye exhibits no discharge. Left eye exhibits  no discharge.  Neck: Normal range of motion.  Cardiovascular: Normal rate, regular rhythm and normal heart sounds.   Pulmonary/Chest: Effort normal and breath sounds normal. No respiratory distress. He has no wheezes.  Abdominal: Soft.  Lymphadenopathy:    He has no cervical adenopathy.  Neurological: He is alert and oriented to person, place, and time.  Skin: Skin is warm and dry.  Psychiatric: He has a normal mood and affect.    Results for orders placed or performed in visit on 09/21/16  CBC with Differential/Platelet  Result Value Ref Range   WBC 11.3 (H) 3.4 - 10.8 x10E3/uL   RBC 4.83 4.14 - 5.80 x10E6/uL   Hemoglobin 15.1 13.0 - 17.7 g/dL   Hematocrit 43.7 37.5 - 51.0 %   MCV 91 79 - 97 fL   MCH 31.3 26.6 - 33.0 pg   MCHC 34.6 31.5 - 35.7 g/dL   RDW 12.7 12.3 - 15.4 %   Platelets 334 150 - 379 x10E3/uL   Neutrophils 52 Not Estab. %   Lymphs 32 Not Estab. %   Monocytes 11 Not Estab. %   Eos 3 Not Estab. %   Basos 1 Not Estab. %   Neutrophils Absolute 6.0 1.4 - 7.0 x10E3/uL   Lymphocytes Absolute 3.6 (H) 0.7 - 3.1 x10E3/uL   Monocytes Absolute 1.3 (H) 0.1 - 0.9 x10E3/uL   EOS (ABSOLUTE) 0.4 0.0 - 0.4 x10E3/uL   Basophils Absolute 0.1 0.0 - 0.2 x10E3/uL   Immature Granulocytes 1 Not Estab. %   Immature Grans (Abs) 0.1 0.0 - 0.1 x10E3/uL  CMP14+EGFR  Result Value Ref Range   Glucose 75 65 - 99 mg/dL   BUN 10 6 - 24 mg/dL   Creatinine, Ser 0.90 0.76 - 1.27 mg/dL   GFR calc non Af Amer 101 >59 mL/min/1.73   GFR calc Af Amer 116 >59 mL/min/1.73   BUN/Creatinine Ratio 11 9 - 20   Sodium 140 134 - 144 mmol/L   Potassium 4.3 3.5 - 5.2 mmol/L   Chloride 99 96 - 106 mmol/L   CO2 24 18 - 29 mmol/L   Calcium 9.8 8.7 - 10.2 mg/dL   Total Protein 7.5 6.0 - 8.5 g/dL   Albumin 4.9 3.5 - 5.5 g/dL   Globulin, Total 2.6 1.5 - 4.5 g/dL   Albumin/Globulin Ratio 1.9 1.2 - 2.2   Bilirubin Total 0.4 0.0 - 1.2 mg/dL   Alkaline Phosphatase 51 39 - 117 IU/L   AST 27 0 - 40 IU/L   ALT  32 0 - 44 IU/L  Lipid panel  Result Value Ref Range   Cholesterol, Total 179 100 - 199 mg/dL   Triglycerides 179 (H) 0 - 149 mg/dL   HDL 31 (L) >39 mg/dL   VLDL Cholesterol Cal 36 5 - 40 mg/dL   LDL Calculated 112 (H) 0 - 99 mg/dL  Chol/HDL Ratio 5.8 (H) 0.0 - 5.0 ratio units      Assessment & Plan:   1. Acute non-recurrent ethmoidal sinusitis - azithromycin (ZITHROMAX Z-PAK) 250 MG tablet; As directed  Dispense: 6 tablet; Refill: 0 - methylPREDNISolone acetate (DEPO-MEDROL) injection 80 mg; Inject 1 mL (80 mg total) into the muscle once. - chlorpheniramine-HYDROcodone ([place]) 10-8 MG/5ML SUER; Take 5 mLs by mouth every 12 (twelve) hours as needed for cough.  Dispense: 120 mL; Refill: 0  2. Dizziness - meclizine (ANTIVERT) 25 MG tablet; Take 1 tablet (25 mg total) by mouth 3 (three) times daily as needed for dizziness.  Dispense: 30 tablet; Refill: 0  3. Cough - chlorpheniramine-HYDROcodone (TUSSIONEX) 10-8 MG/5ML SUER; Take 5 mLs by mouth every 12 (twelve) hours as needed for cough.  Dispense: 120 mL; Refill: 0   Continue all other maintenance medications as listed above.  Follow up plan: Return if symptoms worsen or fail to improve.  Educational handout given for sinusitis  Terald Sleeper PA-C [facility] [address]  [place], [place] [address] [phone]   09/29/2016, 9:15 PM

## 2017-01-12 ENCOUNTER — Encounter: Payer: Self-pay | Admitting: Family

## 2017-01-12 ENCOUNTER — Ambulatory Visit (INDEPENDENT_AMBULATORY_CARE_PROVIDER_SITE_OTHER): Payer: Self-pay | Admitting: Family

## 2017-01-12 VITALS — BP 130/83 | HR 88 | Temp 97.3°F | Ht 72.0 in | Wt 227.0 lb

## 2017-01-12 DIAGNOSIS — J209 Acute bronchitis, unspecified: Secondary | ICD-10-CM

## 2017-01-12 DIAGNOSIS — Z720 Tobacco use: Secondary | ICD-10-CM

## 2017-01-12 MED ORDER — AZITHROMYCIN 250 MG PO TABS: ORAL_TABLET | ORAL | 0 refills | Status: DC

## 2017-01-12 NOTE — Progress Notes (Signed)
   Subjective:    Patient ID: Devin Ryan, male    DOB: [birth year], 51 y.o.   MRN: [id number]  Cough  This is a new problem. The current episode started more than 1 month ago. The problem has been waxing and waning. The problem occurs every few minutes. The cough is non-productive. Associated symptoms include chills, ear congestion, myalgias, nasal congestion, sweats and wheezing. Pertinent negatives include no ear pain, fever, headaches, postnasal drip, rhinorrhea or sore throat. The symptoms are aggravated by lying down. Risk factors for lung disease include smoking/tobacco exposure. He has tried rest and OTC cough suppressant for the symptoms. The treatment provided mild relief. There is no history of asthma or COPD.      Review of Systems  Constitutional: Positive for chills. Negative for fever.  HENT: Negative for ear pain, postnasal drip, rhinorrhea and sore throat.   Respiratory: Positive for cough and wheezing.   Musculoskeletal: Positive for myalgias.  Neurological: Negative for headaches.  All other systems reviewed and are negative.      Objective:   Physical Exam  Constitutional: He is oriented to person, place, and time. He appears well-developed and well-nourished. No distress.  HENT:  Head: Normocephalic.  Right Ear: External ear normal.  Left Ear: External ear normal.  Nose: Mucosal edema and rhinorrhea present.  Mouth/Throat: Posterior oropharyngeal erythema present.  Eyes: Pupils are equal, round, and reactive to light. Right eye exhibits no discharge. Left eye exhibits no discharge.  Neck: Normal range of motion. Neck supple. No thyromegaly present.  Cardiovascular: Normal rate, regular rhythm, normal heart sounds and intact distal pulses.   No murmur heard. Pulmonary/Chest: Effort normal and breath sounds normal. No respiratory distress. He has no wheezes.  Abdominal: Soft. Bowel sounds are normal. He exhibits no distension. There is no tenderness.    Musculoskeletal: Normal range of motion. He exhibits no edema or tenderness.  Neurological: He is alert and oriented to person, place, and time.  Skin: Skin is warm and dry. No rash noted. No erythema.  Psychiatric: He has a normal mood and affect. His behavior is normal. Judgment and thought content normal.  Vitals reviewed.     BP 130/83   Pulse 88   Temp 97.3 F (36.3 C) (Oral)   Ht 6' (1.829 m)   Wt 227 lb (103 kg)   BMI 30.79 kg/m      Assessment & Plan:  1. Acute bronchitis, unspecified organism - Take meds as prescribed - Use a cool mist humidifier  -Use saline nose sprays frequently -Saline irrigations of the nose can be very helpful if done frequently.  * 4X daily for 1 week*  * Use of a nettie pot can be helpful with this. Follow directions with this* -Force fluids -For any cough or congestion  Use plain Mucinex- regular strength or max strength is fine   * Children- consult with Pharmacist for dosing -For fever or aces or pains- take tylenol or ibuprofen appropriate for age and weight.  * for fevers greater than 101 orally you may alternate ibuprofen and tylenol every  3 hours. -Throat lozenges if help - azithromycin (ZITHROMAX) 250 MG tablet; Take 500 mg once, then 250 mg for four days  Dispense: 6 tablet; Refill: 0  2. Tobacco abuse -Smoking cessation discussed   Evelina Dun, FNP

## 2017-01-12 NOTE — Patient Instructions (Signed)

## 2017-01-24 ENCOUNTER — Ambulatory Visit: Payer: BLUE CROSS/BLUE SHIELD | Admitting: Physician Assistant

## 2017-02-15 ENCOUNTER — Encounter: Payer: Self-pay | Admitting: Physician Assistant

## 2017-02-15 MED ORDER — DOXYCYCLINE HYCLATE 100 MG PO TABS: 100.0000 mg | ORAL_TABLET | Freq: Two times a day (BID) | ORAL | 0 refills | Status: DC

## 2017-03-19 ENCOUNTER — Encounter: Payer: Self-pay | Admitting: Physician Assistant

## 2017-03-20 ENCOUNTER — Encounter: Payer: Self-pay | Admitting: Physician Assistant

## 2017-03-20 ENCOUNTER — Encounter: Payer: Self-pay | Admitting: Family

## 2017-03-20 ENCOUNTER — Ambulatory Visit (INDEPENDENT_AMBULATORY_CARE_PROVIDER_SITE_OTHER): Payer: Self-pay | Admitting: Physician Assistant

## 2017-03-20 ENCOUNTER — Telehealth: Payer: Self-pay | Admitting: Physician Assistant

## 2017-03-20 VITALS — BP 131/81 | HR 83 | Ht 72.0 in | Wt 229.0 lb

## 2017-03-20 DIAGNOSIS — L02212 Cutaneous abscess of back [any part, except buttock]: Secondary | ICD-10-CM

## 2017-03-20 MED ORDER — CLINDAMYCIN HCL 300 MG PO CAPS: 300.0000 mg | ORAL_CAPSULE | Freq: Three times a day (TID) | ORAL | 0 refills | Status: DC

## 2017-03-20 NOTE — Progress Notes (Signed)
Subjective:     Patient ID: Devin Ryan, male   DOB: 21-Jan-1966, 50 y.o.   MRN: [id number]  HPI Pt with infection to the mid back area Noticed as a simple "bump" that worsened No drainage but has increased in pain  Review of Systems  Constitutional: Negative for activity change, chills and fever.  Musculoskeletal: Positive for back pain and myalgias.  Skin: Positive for wound.       Objective:   Physical Exam Abscess with necrotic central area + induration 3cm x 3cm No drainage ++ TTP of the area Offered I&D Consent obtained Area cleansed with Betadine Anesth with 1.5 cc lido with epi Abscess unroofed with 11 blade All necrotic tissue removed  Sl purulent material removed Area cleansed and dressed    Assessment:     1. Abscess of back        Plan:     Clindamycin called in earlier so would like for him to start  Keep area clean and dry Nl course reviewed OTC meds for sx F/U prn

## 2017-03-20 NOTE — Telephone Encounter (Signed)
Devin Ryan has sent pt a message via MyChart, will close encounter.

## 2017-03-20 NOTE — Patient Instructions (Signed)
Incision and Drainage, Care After  Refer to this sheet in the next few weeks. These instructions provide you with information about caring for yourself after your procedure. Your health care provider may also give you more specific instructions. Your treatment has been planned according to current medical practices, but problems sometimes occur. Call your health care provider if you have any problems or questions after your procedure.  What can I expect after the procedure?  After the procedure, it is common to have:  · Pain or discomfort around your incision site.  · Drainage from your incision.     Follow these instructions at home:  ·   · Take over-the-counter and prescription medicines only as told by your health care provider.  · If you were prescribed an antibiotic medicine, take it as told by your health care provider. Do not stop taking the antibiotic even if you start to feel better.  · Follow instructions from your health care provider about:  ? How to take care of your incision.  ? When and how you should change your packing and bandage (dressing). Wash your hands with soap and water before you change your dressing. If soap and water are not available, use hand sanitizer.  ? When you should remove your dressing.  · Do not take baths, swim, or use a hot tub until your health care provider approves.  · Keep all follow-up visits as told by your health care provider. This is important.  · Check your incision area every day for signs of infection. Check for:  ? More redness, swelling, or pain.  ? More fluid or blood.  ? Warmth.  ? Pus or a bad smell.  Contact a health care provider if:  · Your cyst or abscess returns.  · You have a fever.  · You have more redness, swelling, or pain around your incision.  · You have more fluid or blood coming from your incision.  · Your incision feels warm to the touch.  · You have pus or a bad smell coming from your incision.  Get help right away if:  · You have severe pain or  bleeding.  · You cannot eat or drink without vomiting.  · You have decreased urine output.  · You become short of breath.  · You have chest pain.  · You cough up blood.  · The area where the incision and drainage occurred becomes numb or it tingles.  This information is not intended to replace advice given to you by your health care provider. Make sure you discuss any questions you have with your health care provider.  Document Released: 12/19/2011 Document Revised: 02/26/2016 Document Reviewed: 07/17/2015  Elsevier Interactive Patient Education © 2017 [employer]   

## 2017-03-21 ENCOUNTER — Encounter: Payer: Self-pay | Admitting: Physician Assistant

## 2017-03-22 ENCOUNTER — Telehealth: Payer: Self-pay | Admitting: *Deleted

## 2017-03-22 ENCOUNTER — Ambulatory Visit (INDEPENDENT_AMBULATORY_CARE_PROVIDER_SITE_OTHER): Payer: Self-pay | Admitting: Physician Assistant

## 2017-03-22 ENCOUNTER — Encounter: Payer: Self-pay | Admitting: Physician Assistant

## 2017-03-22 VITALS — BP 133/83 | HR 95 | Temp 97.8°F | Ht 72.0 in | Wt 226.0 lb

## 2017-03-22 DIAGNOSIS — L03312 Cellulitis of back [any part except buttock]: Secondary | ICD-10-CM

## 2017-03-22 MED ORDER — CEFTRIAXONE SODIUM 1 G IJ SOLR
1.0000 g | Freq: Once | INTRAMUSCULAR | Status: AC
  Administered 2017-03-22: 1 g via INTRAMUSCULAR

## 2017-03-22 MED ORDER — KETOROLAC TROMETHAMINE 10 MG PO TABS: 10.0000 mg | ORAL_TABLET | Freq: Three times a day (TID) | ORAL | 0 refills | Status: DC | PRN

## 2017-03-22 NOTE — Telephone Encounter (Signed)
Michelle,(patient's ex wife), wants you to know that Devin Ryan is not doing very well today. He has fever and can barely walk.  She wonders if he should go to Emergency Dept.?  I advised to give new medications more time and take tylenol or ibuprofen.   He  can go to hospital but unsure what else can be done to help him.  She would like you to call today to discuss ,if possible.

## 2017-03-22 NOTE — Telephone Encounter (Signed)
We will call patient when culture is resulted.

## 2017-03-22 NOTE — Progress Notes (Signed)
Subjective:     Patient ID: Devin Ryan, male   DOB: [birth year], 51 y.o.   MRN: [id number]  HPI Pt here for cellulitis/abscess of the back Still with pain and drainage Tolerating ATB well  Review of Systems  Constitutional: Negative.   Musculoskeletal: Positive for back pain and myalgias.       Objective:   Physical Exam Wound does not appeared cleansed since last visit + purulent drainage noted Still with induration ++ TTP of area Area cleansed and redressed today Rocephin 1gm given in office    Assessment:     1. Cellulitis of back        Plan:     Wound care again reviewed Keep clean and dry Wound cultures sent- will inform of results Continue with ATB Toradol 10mg  tid prn #21 F/U pending labs

## 2017-03-22 NOTE — Patient Instructions (Signed)

## 2017-03-22 NOTE — Telephone Encounter (Signed)
appt was made when our office was opened this morning

## 2017-03-22 NOTE — Telephone Encounter (Signed)
Pt was just seen again today and given Rocephin shot and pain meds Awaiting culture results

## 2017-03-23 ENCOUNTER — Telehealth: Payer: Self-pay | Admitting: Physician Assistant

## 2017-03-23 ENCOUNTER — Encounter: Payer: Self-pay | Admitting: Physician Assistant

## 2017-03-23 NOTE — Telephone Encounter (Signed)
Pt aware  Rx was sent to pharmacy. 

## 2017-03-27 LAB — ANAEROBIC AND AEROBIC CULTURE

## 2017-03-28 ENCOUNTER — Encounter: Payer: Self-pay | Admitting: Physician Assistant

## 2017-03-28 ENCOUNTER — Other Ambulatory Visit: Payer: Self-pay | Admitting: Physician Assistant

## 2017-03-28 MED ORDER — SULFAMETHOXAZOLE-TRIMETHOPRIM 800-160 MG PO TABS: 1.0000 | ORAL_TABLET | Freq: Two times a day (BID) | ORAL | 0 refills | Status: DC

## 2017-05-29 ENCOUNTER — Encounter: Payer: Self-pay | Admitting: Physician Assistant

## 2017-05-31 ENCOUNTER — Encounter: Payer: Self-pay | Admitting: Physician Assistant

## 2017-05-31 DIAGNOSIS — W57XXXA Bitten or stung by nonvenomous insect and other nonvenomous arthropods, initial encounter: Secondary | ICD-10-CM

## 2017-05-31 NOTE — Telephone Encounter (Signed)
Patient would like to have a lyme titer done for tick bites. He wants to know if he can have drawn since he has no insurance. Please advise

## 2017-06-13 ENCOUNTER — Other Ambulatory Visit: Payer: Self-pay

## 2017-06-13 DIAGNOSIS — W57XXXA Bitten or stung by nonvenomous insect and other nonvenomous arthropods, initial encounter: Secondary | ICD-10-CM

## 2017-06-14 LAB — LYME AB/WESTERN BLOT REFLEX: Lyme IgG/IgM Ab: <0.91 {ISR} (ref 0.00–0.90)

## 2017-09-07 ENCOUNTER — Encounter: Payer: Self-pay | Admitting: Physician Assistant

## 2017-10-15 ENCOUNTER — Encounter: Payer: Self-pay | Admitting: Physician Assistant

## 2017-10-16 MED ORDER — SULFAMETHOXAZOLE-TRIMETHOPRIM 800-160 MG PO TABS: 1.0000 | ORAL_TABLET | Freq: Two times a day (BID) | ORAL | 0 refills | Status: DC

## 2017-10-18 ENCOUNTER — Encounter: Payer: Self-pay | Admitting: Physician Assistant

## 2017-10-24 ENCOUNTER — Encounter: Payer: Self-pay | Admitting: Physician Assistant

## 2017-10-24 ENCOUNTER — Ambulatory Visit (INDEPENDENT_AMBULATORY_CARE_PROVIDER_SITE_OTHER): Payer: 59 | Admitting: Physician Assistant

## 2017-10-24 VITALS — BP 133/86 | HR 86 | Temp 97.4°F | Ht 72.0 in | Wt 231.0 lb

## 2017-10-24 DIAGNOSIS — Z0000 Encounter for general adult medical examination without abnormal findings: Secondary | ICD-10-CM

## 2017-10-24 DIAGNOSIS — L0291 Cutaneous abscess, unspecified: Secondary | ICD-10-CM | POA: Diagnosis not present

## 2017-10-24 DIAGNOSIS — F5101 Primary insomnia: Secondary | ICD-10-CM | POA: Diagnosis not present

## 2017-10-24 MED ORDER — SULFAMETHOXAZOLE-TRIMETHOPRIM 800-160 MG PO TABS: 1.0000 | ORAL_TABLET | Freq: Two times a day (BID) | ORAL | 2 refills | Status: DC

## 2017-10-24 MED ORDER — ESZOPICLONE 3 MG PO TABS: 3.0000 mg | ORAL_TABLET | Freq: Every day | ORAL | 5 refills | Status: DC

## 2017-10-25 ENCOUNTER — Encounter: Payer: Self-pay | Admitting: Physician Assistant

## 2017-10-25 LAB — CBC WITH DIFFERENTIAL/PLATELET
Basophils Absolute: 0.1 10*3/uL (ref 0.0–0.2)
Basos: 1 %
EOS (ABSOLUTE): 0.5 10*3/uL — ABNORMAL HIGH (ref 0.0–0.4)
Eos: 5 %
HEMATOCRIT: 44.8 % (ref 37.5–51.0)
Hemoglobin: 15.7 g/dL (ref 13.0–17.7)
Immature Grans (Abs): 0.1 10*3/uL (ref 0.0–0.1)
Immature Granulocytes: 1 %
LYMPHS ABS: 4 10*3/uL — AB (ref 0.7–3.1)
Lymphs: 40 %
MCH: 31.8 pg (ref 26.6–33.0)
MCHC: 35 g/dL (ref 31.5–35.7)
MCV: 91 fL (ref 79–97)
Monocytes Absolute: 0.7 10*3/uL (ref 0.1–0.9)
Monocytes: 7 %
Neutrophils Absolute: 4.6 10*3/uL (ref 1.4–7.0)
Neutrophils: 46 %
Platelets: 299 10*3/uL (ref 150–379)
RBC: 4.94 x10E6/uL (ref 4.14–5.80)
RDW: 13.4 % (ref 12.3–15.4)
WBC: 9.9 10*3/uL (ref 3.4–10.8)

## 2017-10-25 LAB — CMP14+EGFR
A/G RATIO: 1.7 (ref 1.2–2.2)
ALBUMIN: 4.9 g/dL (ref 3.5–5.5)
ALK PHOS: 74 IU/L (ref 39–117)
ALT: 46 IU/L — ABNORMAL HIGH (ref 0–44)
AST: 32 IU/L (ref 0–40)
BILIRUBIN TOTAL: 0.4 mg/dL (ref 0.0–1.2)
BUN / CREAT RATIO: 9 (ref 9–20)
BUN: 13 mg/dL (ref 6–24)
CHLORIDE: 99 mmol/L (ref 96–106)
CO2: 23 mmol/L (ref 20–29)
Calcium: 10 mg/dL (ref 8.7–10.2)
Creatinine, Ser: 1.38 mg/dL — ABNORMAL HIGH (ref 0.76–1.27)
GFR calc Af Amer: 69 mL/min/{1.73_m2} (ref >59)
GFR calc non Af Amer: 60 mL/min/{1.73_m2} (ref >59)
GLOBULIN, TOTAL: 2.9 g/dL (ref 1.5–4.5)
GLUCOSE: 78 mg/dL (ref 65–99)
POTASSIUM: 4.7 mmol/L (ref 3.5–5.2)
Sodium: 138 mmol/L (ref 134–144)
Total Protein: 7.8 g/dL (ref 6.0–8.5)

## 2017-10-25 LAB — STD SCREEN (8)
HEP B C IGM: NEGATIVE
HEP C VIRUS AB: 0.1 {s_co_ratio} (ref 0.0–0.9)
HIV SCREEN 4TH GENERATION: NONREACTIVE
HSV 1 Glycoprotein G Ab, IgG: 56.8 index — ABNORMAL HIGH (ref 0.00–0.90)
HSV 2 IgG, Type Spec: <0.91 index (ref 0.00–0.90)
Hep A IgM: NEGATIVE
Hepatitis B Surface Ag: NEGATIVE
RPR: NONREACTIVE

## 2017-10-25 LAB — TSH: TSH: 2.98 u[IU]/mL (ref 0.450–4.500)

## 2017-10-25 NOTE — Patient Instructions (Signed)
In a few days you may receive a survey in the mail or online from [employer] regarding your visit with us today. Please take a moment to fill this out. Your feedback is very important to our whole office. It can help us better understand your needs as well as improve your experience and satisfaction. Thank you for taking your time to complete it. We care about you.  Tamico Mundo, PA-C  

## 2017-10-25 NOTE — Progress Notes (Signed)
BP 133/86   Pulse 86   Temp (!) 97.4 F (36.3 C) (Oral)   Ht 6' (1.829 m)   Wt 231 lb (104.8 kg)   BMI 31.33 kg/m    Subjective:    Patient ID: Devin Ryan, male    DOB: 07/23/1966, 52 y.o.   MRN: [id number]  HPI: Devin Ryan is a 52 y.o. male presenting on 10/24/2017 for Recurrent Skin Infections (on left hand )  Patient is greatly concerned about the recurrence of abscesses.  He had a significant one on his back that had to be drained last year.  And most recently he has had one on his hand.  On the hands started only as an ingrown hair.  It became very infected and swollen.  Both times had to have antibiotics also.  He is due some well labs.  He was concerned overall if his immunity was down or if there was anything else going on with him.  All his medications are reviewed today.  Relevant past medical, surgical, family and social history reviewed and updated as indicated. Allergies and medications reviewed and updated.  Past Medical History:  Diagnosis Date  . Anxiety   . Diverticulitis     Past Surgical History:  Procedure Laterality Date  . NOSE SURGERY    . PARTIAL COLECTOMY N/A 09/28/2015   Procedure: PARTIAL COLECTOMY;  Surgeon: Aviva Signs, MD;  Location: [facility];  Service: General;  Laterality: N/A;    Review of Systems  Constitutional: Negative.  Negative for appetite change and fatigue.  HENT: Negative.   Eyes: Negative.  Negative for pain and visual disturbance.  Respiratory: Negative.  Negative for cough, chest tightness, shortness of breath and wheezing.   Cardiovascular: Negative.  Negative for chest pain, palpitations and leg swelling.  Gastrointestinal: Negative.  Negative for abdominal pain, diarrhea, nausea and vomiting.  Endocrine: Negative.   Genitourinary: Negative.   Musculoskeletal: Negative.   Skin: Positive for color change, rash and wound.  Neurological: Negative.  Negative for weakness, numbness and headaches.  Psychiatric/Behavioral:  Negative.     Allergies as of 10/24/2017   No Known Allergies     Medication List        Accurate as of 10/24/17 11:59 PM. Always use your most recent med list.          Eszopiclone 3 MG Tabs Take 1 tablet (3 mg total) by mouth at bedtime. Take immediately before bedtime   ibuprofen 200 MG tablet Commonly known as:  ADVIL,MOTRIN Take 800 mg by mouth every 6 (six) hours as needed for moderate pain.   PROAIR HFA 108 (90 Base) MCG/ACT inhaler Generic drug:  albuterol Inhale 1 puff into the lungs every 6 (six) hours as needed. Shortness of breath   sulfamethoxazole-trimethoprim 800-160 MG tablet Commonly known as:  BACTRIM DS Take 1 tablet by mouth 2 (two) times daily.          Objective:    BP 133/86   Pulse 86   Temp (!) 97.4 F (36.3 C) (Oral)   Ht 6' (1.829 m)   Wt 231 lb (104.8 kg)   BMI 31.33 kg/m   No Known Allergies  Physical Exam  Constitutional: He appears well-developed and well-nourished.  HENT:  Head: Normocephalic and atraumatic.  Eyes: Conjunctivae and EOM are normal. Pupils are equal, round, and reactive to light.  Neck: Normal range of motion. Neck supple.  Cardiovascular: Normal rate, regular rhythm and normal heart sounds.  Pulmonary/Chest: Effort  normal and breath sounds normal.  Abdominal: Soft. Bowel sounds are normal.  Musculoskeletal: Normal range of motion.  Skin: Skin is warm and dry. Lesion noted. There is erythema.       Results for orders placed or performed in visit on 10/24/17  STD Screen (8)  Result Value Ref Range   Hep A IgM Negative Negative   Hepatitis B Surface Ag Negative Negative   Hep B C IgM Negative Negative   Hep C Virus Ab 0.1 0.0 - 0.9 s/co ratio   RPR Ser Ql Non Reactive Non Reactive   HIV Screen 4th Generation wRfx Non Reactive Non Reactive   HSV 1 Glycoprotein G Ab, IgG 56.80 (H) 0.00 - 0.90 index   HSV 2 IgG, Type Spec <0.91 0.00 - 0.90 index  CMP14+EGFR  Result Value Ref Range   Glucose 78 65 - 99  mg/dL   BUN 13 6 - 24 mg/dL   Creatinine, Ser 1.38 (H) 0.76 - 1.27 mg/dL   GFR calc non Af Amer 60 >59 mL/min/1.73   GFR calc Af Amer 69 >59 mL/min/1.73   BUN/Creatinine Ratio 9 9 - 20   Sodium 138 134 - 144 mmol/L   Potassium 4.7 3.5 - 5.2 mmol/L   Chloride 99 96 - 106 mmol/L   CO2 23 20 - 29 mmol/L   Calcium 10.0 8.7 - 10.2 mg/dL   Total Protein 7.8 6.0 - 8.5 g/dL   Albumin 4.9 3.5 - 5.5 g/dL   Globulin, Total 2.9 1.5 - 4.5 g/dL   Albumin/Globulin Ratio 1.7 1.2 - 2.2   Bilirubin Total 0.4 0.0 - 1.2 mg/dL   Alkaline Phosphatase 74 39 - 117 IU/L   AST 32 0 - 40 IU/L   ALT 46 (H) 0 - 44 IU/L  CBC with Differential/Platelet  Result Value Ref Range   WBC 9.9 3.4 - 10.8 x10E3/uL   RBC 4.94 4.14 - 5.80 x10E6/uL   Hemoglobin 15.7 13.0 - 17.7 g/dL   Hematocrit 44.8 37.5 - 51.0 %   MCV 91 79 - 97 fL   MCH 31.8 26.6 - 33.0 pg   MCHC 35.0 31.5 - 35.7 g/dL   RDW 13.4 12.3 - 15.4 %   Platelets 299 150 - 379 x10E3/uL   Neutrophils 46 Not Estab. %   Lymphs 40 Not Estab. %   Monocytes 7 Not Estab. %   Eos 5 Not Estab. %   Basos 1 Not Estab. %   Neutrophils Absolute 4.6 1.4 - 7.0 x10E3/uL   Lymphocytes Absolute 4.0 (H) 0.7 - 3.1 x10E3/uL   Monocytes Absolute 0.7 0.1 - 0.9 x10E3/uL   EOS (ABSOLUTE) 0.5 (H) 0.0 - 0.4 x10E3/uL   Basophils Absolute 0.1 0.0 - 0.2 x10E3/uL   Immature Granulocytes 1 Not Estab. %   Immature Grans (Abs) 0.1 0.0 - 0.1 x10E3/uL  TSH  Result Value Ref Range   TSH 2.980 0.450 - 4.500 uIU/mL      Assessment & Plan:   1. Well adult exam - STD Screen (8) - CMP14+EGFR - CBC with Differential/Platelet - TSH  2. Abscess - sulfamethoxazole-trimethoprim (BACTRIM DS) 800-160 MG tablet; Take 1 tablet by mouth 2 (two) times daily.  Dispense: 20 tablet; Refill: 2  3. Primary insomnia - Eszopiclone 3 MG TABS; Take 1 tablet (3 mg total) by mouth at bedtime. Take immediately before bedtime  Dispense: 30 tablet; Refill: 5    Current Outpatient Medications:  .   Eszopiclone 3 MG TABS, Take 1 tablet (3  mg total) by mouth at bedtime. Take immediately before bedtime, Disp: 30 tablet, Rfl: 5 .  ibuprofen (ADVIL,MOTRIN) 200 MG tablet, Take 800 mg by mouth every 6 (six) hours as needed for moderate pain., Disp: , Rfl:  .  PROAIR HFA 108 (90 BASE) MCG/ACT inhaler, Inhale 1 puff into the lungs every 6 (six) hours as needed. Shortness of breath, Disp: , Rfl:  .  sulfamethoxazole-trimethoprim (BACTRIM DS) 800-160 MG tablet, Take 1 tablet by mouth 2 (two) times daily., Disp: 20 tablet, Rfl: 2 Continue all other maintenance medications as listed above.  Follow up plan: No Follow-up on file.  Educational handout given for [place] PA-C [place] [address]  [place], [place] [address] [phone]   10/25/2017, 11:07 AM

## 2017-11-01 ENCOUNTER — Other Ambulatory Visit: Payer: Self-pay | Admitting: Physician Assistant

## 2017-12-20 DIAGNOSIS — L720 Epidermal cyst: Secondary | ICD-10-CM | POA: Diagnosis not present

## 2017-12-20 DIAGNOSIS — L821 Other seborrheic keratosis: Secondary | ICD-10-CM | POA: Diagnosis not present

## 2018-08-27 ENCOUNTER — Encounter: Payer: Self-pay | Admitting: Physician Assistant

## 2018-08-28 ENCOUNTER — Encounter: Payer: Self-pay | Admitting: Physician Assistant

## 2018-09-13 ENCOUNTER — Ambulatory Visit: Payer: 59 | Admitting: Physician Assistant

## 2018-09-18 ENCOUNTER — Encounter: Payer: Self-pay | Admitting: Physician Assistant

## 2018-09-18 ENCOUNTER — Ambulatory Visit (INDEPENDENT_AMBULATORY_CARE_PROVIDER_SITE_OTHER): Payer: 59 | Admitting: Physician Assistant

## 2018-09-18 VITALS — BP 135/78 | HR 85 | Temp 97.6°F | Ht 72.0 in | Wt 225.4 lb

## 2018-09-18 DIAGNOSIS — R6882 Decreased libido: Secondary | ICD-10-CM | POA: Diagnosis not present

## 2018-09-18 DIAGNOSIS — E781 Pure hyperglyceridemia: Secondary | ICD-10-CM | POA: Insufficient documentation

## 2018-09-18 DIAGNOSIS — F5101 Primary insomnia: Secondary | ICD-10-CM | POA: Diagnosis not present

## 2018-09-18 DIAGNOSIS — F17200 Nicotine dependence, unspecified, uncomplicated: Secondary | ICD-10-CM | POA: Insufficient documentation

## 2018-09-18 DIAGNOSIS — N539 Unspecified male sexual dysfunction: Secondary | ICD-10-CM | POA: Diagnosis not present

## 2018-09-18 MED ORDER — PROAIR HFA 108 (90 BASE) MCG/ACT IN AERS: 1.0000 | INHALATION_SPRAY | Freq: Four times a day (QID) | RESPIRATORY_TRACT | 1 refills | Status: DC | PRN

## 2018-09-18 MED ORDER — FENOFIBRATE 134 MG PO CAPS: 134.0000 mg | ORAL_CAPSULE | Freq: Every day | ORAL | 11 refills | Status: DC

## 2018-09-18 MED ORDER — ESZOPICLONE 3 MG PO TABS: 3.0000 mg | ORAL_TABLET | Freq: Every day | ORAL | 5 refills | Status: DC

## 2018-09-18 MED ORDER — BETAMETHASONE DIPROPIONATE 0.05 % EX CREA: TOPICAL_CREAM | Freq: Two times a day (BID) | CUTANEOUS | 5 refills | Status: DC

## 2018-09-19 ENCOUNTER — Ambulatory Visit: Payer: 59 | Admitting: Physician Assistant

## 2018-09-19 NOTE — Progress Notes (Signed)
BP 135/78   Pulse 85   Temp 97.6 F (36.4 C) (Oral)   Ht 6' (1.829 m)   Wt 225 lb 6.4 oz (102.2 kg)   BMI 30.57 kg/m    Subjective:    Patient ID: Devin Ryan, male    DOB: July 02, 1966, 52 y.o.   MRN: [id number]  HPI: Devin Ryan is a 52 y.o. male presenting on 09/18/2018 for Insomnia; Erectile Dysfunction; Left ear itching; and ELevated cholesterol  This patient comes in today for multiple medical complaints.  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  He was given injections for some time.  He did not really have much of an increase in the blood level.  At that time he did not have any erectile dysfunction symptoms.  However he does have the symptoms now.  He also has just general fatigue and lethargy.  And he does feel sometimes weaker in general.  We will have labs drawn to evaluate for this.  He does smoke.  He is suffering from his insomnia again.  He had been on Lunesta 3 mg and tolerated it exceptionally well and had just gotten off of it and would like to restart it.  He has never been inappropriate with this medication and we will go ahead and send a refill.  We will  He did have elevated triglyceride on his work lab work.  He has had this problem in the past.  He had taken a fenofibric in the past.  This time the triglycerides were 477.  We will go ahead and start the medication.  Past Medical History:  Diagnosis Date  . Anxiety   . Diverticulitis    Relevant past medical, surgical, family and social history reviewed and updated as indicated. Interim medical history since our last visit reviewed. Allergies and medications reviewed and updated. DATA REVIEWED: CHART IN EPIC  Family History reviewed for pertinent findings.  Review of Systems  Constitutional: Positive for fatigue. Negative for appetite change.  HENT: Negative.   Eyes: Negative.  Negative for pain and visual disturbance.  Respiratory: Negative.  Negative for cough, chest  tightness, shortness of breath and wheezing.   Cardiovascular: Negative.  Negative for chest pain, palpitations and leg swelling.  Gastrointestinal: Negative.  Negative for abdominal pain, diarrhea, nausea and vomiting.  Endocrine: Negative.   Genitourinary: Negative.  Negative for discharge, penile pain, penile swelling, scrotal swelling and testicular pain.  Musculoskeletal: Negative.   Skin: Negative.  Negative for color change and rash.  Neurological: Positive for weakness. Negative for numbness and headaches.  Psychiatric/Behavioral: Positive for sleep disturbance. Negative for agitation and decreased concentration.    Allergies as of 09/18/2018   No Known Allergies     Medication List        Accurate as of 09/18/18 11:59 PM. Always use your most recent med list.          betamethasone dipropionate 0.05 % cream Commonly known as:  DIPROLENE Apply topically 2 (two) times daily.   Eszopiclone 3 MG Tabs Take 1 tablet (3 mg total) by mouth at bedtime. Take immediately before bedtime   fenofibrate micronized 134 MG capsule Commonly known as:  LOFIBRA Take 1 capsule (134 mg total) by mouth daily before breakfast.   ibuprofen 200 MG tablet Commonly known as:  ADVIL,MOTRIN Take 800 mg by mouth every 6 (six) hours as needed for moderate pain.   PROAIR HFA 108 (90 Base) MCG/ACT inhaler Generic drug:  albuterol Inhale 1 puff into the lungs every 6 (six) hours as needed. Shortness of breath          Objective:    BP 135/78   Pulse 85   Temp 97.6 F (36.4 C) (Oral)   Ht 6' (1.829 m)   Wt 225 lb 6.4 oz (102.2 kg)   BMI 30.57 kg/m   No Known Allergies  Wt Readings from Last 3 Encounters:  09/18/18 225 lb 6.4 oz (102.2 kg)  10/24/17 231 lb (104.8 kg)  03/22/17 226 lb (102.5 kg)    Physical Exam  Constitutional: He appears well-developed and well-nourished.  HENT:  Head: Normocephalic and atraumatic.  Right Ear: There is tenderness.  Left Ear: There is  tenderness.  Eyes: Pupils are equal, round, and reactive to light. Conjunctivae and EOM are normal.  Neck: Normal range of motion. Neck supple.  Cardiovascular: Normal rate, regular rhythm and normal heart sounds.  Pulmonary/Chest: Effort normal and breath sounds normal.  Abdominal: Soft. Bowel sounds are normal.  Musculoskeletal: Normal range of motion.  Skin: Skin is warm and dry.    Results for orders placed or performed in visit on 10/24/17  STD Screen (8)  Result Value Ref Range   Hep A IgM Negative Negative   Hepatitis B Surface Ag Negative Negative   Hep B C IgM Negative Negative   Hep C Virus Ab 0.1 0.0 - 0.9 s/co ratio   RPR Ser Ql Non Reactive Non Reactive   HIV Screen 4th Generation wRfx Non Reactive Non Reactive   HSV 1 Glycoprotein G Ab, IgG 56.80 (H) 0.00 - 0.90 index   HSV 2 IgG, Type Spec <0.91 0.00 - 0.90 index  CMP14+EGFR  Result Value Ref Range   Glucose 78 65 - 99 mg/dL   BUN 13 6 - 24 mg/dL   Creatinine, Ser 1.38 (H) 0.76 - 1.27 mg/dL   GFR calc non Af Amer 60 >59 mL/min/1.73   GFR calc Af Amer 69 >59 mL/min/1.73   BUN/Creatinine Ratio 9 9 - 20   Sodium 138 134 - 144 mmol/L   Potassium 4.7 3.5 - 5.2 mmol/L   Chloride 99 96 - 106 mmol/L   CO2 23 20 - 29 mmol/L   Calcium 10.0 8.7 - 10.2 mg/dL   Total Protein 7.8 6.0 - 8.5 g/dL   Albumin 4.9 3.5 - 5.5 g/dL   Globulin, Total 2.9 1.5 - 4.5 g/dL   Albumin/Globulin Ratio 1.7 1.2 - 2.2   Bilirubin Total 0.4 0.0 - 1.2 mg/dL   Alkaline Phosphatase 74 39 - 117 IU/L   AST 32 0 - 40 IU/L   ALT 46 (H) 0 - 44 IU/L  CBC with Differential/Platelet  Result Value Ref Range   WBC 9.9 3.4 - 10.8 x10E3/uL   RBC 4.94 4.14 - 5.80 x10E6/uL   Hemoglobin 15.7 13.0 - 17.7 g/dL   Hematocrit 44.8 37.5 - 51.0 %   MCV 91 79 - 97 fL   MCH 31.8 26.6 - 33.0 pg   MCHC 35.0 31.5 - 35.7 g/dL   RDW 13.4 12.3 - 15.4 %   Platelets 299 150 - 379 x10E3/uL   Neutrophils 46 Not Estab. %   Lymphs 40 Not Estab. %   Monocytes 7 Not Estab.  %   Eos 5 Not Estab. %   Basos 1 Not Estab. %   Neutrophils Absolute 4.6 1.4 - 7.0 x10E3/uL   Lymphocytes Absolute 4.0 (H) 0.7 - 3.1 x10E3/uL   Monocytes Absolute  0.7 0.1 - 0.9 x10E3/uL   EOS (ABSOLUTE) 0.5 (H) 0.0 - 0.4 x10E3/uL   Basophils Absolute 0.1 0.0 - 0.2 x10E3/uL   Immature Granulocytes 1 Not Estab. %   Immature Grans (Abs) 0.1 0.0 - 0.1 x10E3/uL  TSH  Result Value Ref Range   TSH 2.980 0.450 - 4.500 uIU/mL      Assessment & Plan:   1. Primary insomnia - Eszopiclone (ESZOPICLONE) 3 MG TABS; Take 1 tablet (3 mg total) by mouth at bedtime. Take immediately before bedtime  Dispense: 30 tablet; Refill: 5  2. Hypertriglyceridemia - fenofibrate micronized (LOFIBRA) 134 MG capsule; Take 1 capsule (134 mg total) by mouth daily before breakfast.  Dispense: 30 capsule; Refill: 11  3. Decreased libido - Testosterone,Free and Total; Future  4. Male sexual dysfunction - Testosterone,Free and Total; Future  5. Tobacco dependence Discussed cessation techniques   Continue all other maintenance medications as listed above.  Follow up plan: Return in about 6 months (around 03/20/2019).  Educational handout given for [place] PA-C [place] [address]  [place], [place] [address] [phone]   09/19/2018, 1:19 PM

## 2018-09-22 ENCOUNTER — Other Ambulatory Visit: Payer: 59

## 2018-09-22 DIAGNOSIS — N539 Unspecified male sexual dysfunction: Secondary | ICD-10-CM

## 2018-09-22 DIAGNOSIS — R6882 Decreased libido: Secondary | ICD-10-CM

## 2018-09-23 LAB — TESTOSTERONE,FREE AND TOTAL
Testosterone, Free: 9.2 pg/mL (ref 7.2–24.0)
Testosterone: 262 ng/dL — ABNORMAL LOW (ref 264–916)

## 2018-09-25 ENCOUNTER — Encounter: Payer: Self-pay | Admitting: Physician Assistant

## 2018-09-25 ENCOUNTER — Other Ambulatory Visit: Payer: Self-pay | Admitting: Physician Assistant

## 2018-09-25 MED ORDER — TESTOSTERONE CYPIONATE 200 MG/ML IM SOLN: 200.0000 mg | INTRAMUSCULAR | 5 refills | Status: DC

## 2018-09-25 MED ORDER — "SYRINGE 25G X 1-1/2"" 3 ML MISC": 1.0000 [IU] | 5 refills | Status: DC

## 2018-11-26 ENCOUNTER — Encounter: Payer: Self-pay | Admitting: Physician Assistant

## 2018-11-30 ENCOUNTER — Encounter: Payer: Self-pay | Admitting: Physician Assistant

## 2018-11-30 ENCOUNTER — Encounter: Payer: Self-pay | Admitting: *Deleted

## 2018-11-30 ENCOUNTER — Telehealth: Payer: Self-pay | Admitting: Physician Assistant

## 2018-11-30 DIAGNOSIS — L309 Dermatitis, unspecified: Secondary | ICD-10-CM

## 2018-11-30 DIAGNOSIS — R519 Headache, unspecified: Secondary | ICD-10-CM | POA: Insufficient documentation

## 2018-11-30 DIAGNOSIS — R51 Headache: Secondary | ICD-10-CM

## 2018-11-30 HISTORY — DX: Dermatitis, unspecified: L30.9

## 2018-11-30 HISTORY — DX: Headache, unspecified: R51.9

## 2018-12-17 ENCOUNTER — Encounter: Payer: Self-pay | Admitting: Physician Assistant

## 2018-12-17 ENCOUNTER — Other Ambulatory Visit: Payer: Self-pay | Admitting: Physician Assistant

## 2018-12-17 DIAGNOSIS — E291 Testicular hypofunction: Secondary | ICD-10-CM

## 2019-03-06 ENCOUNTER — Encounter: Payer: Self-pay | Admitting: Physician Assistant

## 2019-03-11 ENCOUNTER — Inpatient Hospital Stay: Admission: RE | Admit: 2019-03-11 | Payer: BLUE CROSS/BLUE SHIELD | Source: Ambulatory Visit

## 2019-03-11 ENCOUNTER — Encounter: Payer: Self-pay | Admitting: Physician Assistant

## 2019-03-11 ENCOUNTER — Inpatient Hospital Stay
Admission: RE | Admit: 2019-03-11 | Discharge: 2019-03-11 | Disposition: A | Discharge status: Home / Self Care | Payer: 59 | Source: Ambulatory Visit

## 2019-03-12 ENCOUNTER — Other Ambulatory Visit: Payer: Self-pay | Admitting: Physician Assistant

## 2019-03-12 MED ORDER — DOXYCYCLINE HYCLATE 100 MG PO TABS: 100.0000 mg | ORAL_TABLET | Freq: Two times a day (BID) | ORAL | 0 refills | Status: DC

## 2019-03-18 ENCOUNTER — Encounter: Payer: Self-pay | Admitting: Physician Assistant

## 2019-03-19 ENCOUNTER — Other Ambulatory Visit: Payer: Self-pay

## 2019-03-20 ENCOUNTER — Encounter: Payer: Self-pay | Admitting: Physician Assistant

## 2019-03-20 ENCOUNTER — Ambulatory Visit (INDEPENDENT_AMBULATORY_CARE_PROVIDER_SITE_OTHER): Payer: 59 | Admitting: Physician Assistant

## 2019-03-20 VITALS — BP 138/78 | HR 79 | Temp 97.8°F | Ht 72.0 in | Wt 206.0 lb

## 2019-03-20 DIAGNOSIS — E781 Pure hyperglyceridemia: Secondary | ICD-10-CM | POA: Diagnosis not present

## 2019-03-20 DIAGNOSIS — Z1211 Encounter for screening for malignant neoplasm of colon: Secondary | ICD-10-CM

## 2019-03-20 DIAGNOSIS — E291 Testicular hypofunction: Secondary | ICD-10-CM | POA: Diagnosis not present

## 2019-03-20 DIAGNOSIS — Z0000 Encounter for general adult medical examination without abnormal findings: Secondary | ICD-10-CM

## 2019-03-20 DIAGNOSIS — F5101 Primary insomnia: Secondary | ICD-10-CM | POA: Diagnosis not present

## 2019-03-20 MED ORDER — FENOFIBRATE 134 MG PO CAPS: 134.0000 mg | ORAL_CAPSULE | Freq: Every day | ORAL | 11 refills | Status: DC

## 2019-03-20 MED ORDER — TESTOSTERONE CYPIONATE 200 MG/ML IM SOLN: 200.0000 mg | INTRAMUSCULAR | 5 refills | Status: DC

## 2019-03-20 MED ORDER — ESZOPICLONE 3 MG PO TABS: 3.0000 mg | ORAL_TABLET | Freq: Every day | ORAL | 5 refills | Status: DC

## 2019-03-21 ENCOUNTER — Encounter: Payer: Self-pay | Admitting: Physician Assistant

## 2019-03-21 NOTE — Progress Notes (Signed)
BP 138/78   Pulse 79   Temp 97.8 F (36.6 C) (Oral)   Ht 6' (1.829 m)   Wt 206 lb (93.4 kg)   BMI 27.94 kg/m    Subjective:    Patient ID: Devin Ryan, male    DOB: 01-09-1966, 53 y.o.   MRN: [id number]  HPI: Devin Ryan is a 53 y.o. male presenting on 03/20/2019 for Medical Management of Chronic Issues (6 month )  This patient comes in for 74monthfollow-up on his chronic medical conditions.  They do include insomnia, hypertriglyceridemia, hypogonadism.  Patient also needs to have well labs performed and a recheck on his testosterone.  Labs will be placed for him to come in the future.  I have not asked him to come back before 10 AM for the test to illustrate.  He does need refills on his medications.  We have discussed needing a screening for colon cancer.  He had diverticulitis in the past.  And has had a colonoscopy performed.  He does not remember ever being told he had any polyps.  He has had a colectomy because of the infection.  We will order a Cologuard at this time.  Past Medical History:  Diagnosis Date  . Anxiety   . Diverticulitis   . Eczema 11/30/2018  . Headache 11/30/2018   Relevant past medical, surgical, family and social history reviewed and updated as indicated. Interim medical history since our last visit reviewed. Allergies and medications reviewed and updated. DATA REVIEWED: CHART IN EPIC  Family History reviewed for pertinent findings.  Review of Systems  Constitutional: Negative.  Negative for appetite change and fatigue.  HENT: Negative.   Eyes: Negative.  Negative for pain and visual disturbance.  Respiratory: Negative.  Negative for cough, chest tightness, shortness of breath and wheezing.   Cardiovascular: Negative.  Negative for chest pain, palpitations and leg swelling.  Gastrointestinal: Negative.  Negative for abdominal pain, diarrhea, nausea and vomiting.  Endocrine: Negative.   Genitourinary: Negative.   Musculoskeletal: Negative.    Skin: Negative.  Negative for color change and rash.  Neurological: Negative.  Negative for weakness, numbness and headaches.  Psychiatric/Behavioral: Negative.     Allergies as of 03/20/2019   No Known Allergies     Medication List       Accurate as of March 20, 2019 11:59 PM. If you have any questions, ask your nurse or doctor.        betamethasone dipropionate 0.05 % cream Commonly known as: DIPROLENE Apply topically 2 (two) times daily.   doxycycline 100 MG tablet Commonly known as: VIBRA-TABS Take 1 tablet (100 mg total) by mouth 2 (two) times daily. 1 po bid   Eszopiclone 3 MG Tabs Commonly known as: eszopiclone Take 1 tablet (3 mg total) by mouth at bedtime. Take immediately before bedtime   fenofibrate micronized 134 MG capsule Commonly known as: LOFIBRA Take 1 capsule (134 mg total) by mouth daily before breakfast.   ibuprofen 200 MG tablet Commonly known as: ADVIL Take 800 mg by mouth every 6 (six) hours as needed for moderate pain.   ProAir HFA 108 (90 Base) MCG/ACT inhaler Generic drug: albuterol Inhale 1 puff into the lungs every 6 (six) hours as needed. Shortness of breath   SYRINGE 3CC/25GX1-1/2" 25G X 1-1/2" 3 ML Misc 1 Units by Does not apply route every 21 ( twenty-one) days.   testosterone cypionate 200 MG/ML injection Commonly known as: DEPOTESTOSTERONE CYPIONATE Inject 1 mL (200 mg total)  into the muscle every 14 (fourteen) days.          Objective:    BP 138/78   Pulse 79   Temp 97.8 F (36.6 C) (Oral)   Ht 6' (1.829 m)   Wt 206 lb (93.4 kg)   BMI 27.94 kg/m   No Known Allergies  Wt Readings from Last 3 Encounters:  03/20/19 206 lb (93.4 kg)  09/18/18 225 lb 6.4 oz (102.2 kg)  10/24/17 231 lb (104.8 kg)    Physical Exam Vitals signs and nursing note reviewed.  Constitutional:      General: He is not in acute distress.    Appearance: He is well-developed.  HENT:     Head: Normocephalic and atraumatic.  Eyes:      Conjunctiva/sclera: Conjunctivae normal.     Pupils: Pupils are equal, round, and reactive to light.  Cardiovascular:     Rate and Rhythm: Normal rate and regular rhythm.     Heart sounds: Normal heart sounds.  Pulmonary:     Effort: Pulmonary effort is normal. No respiratory distress.     Breath sounds: Normal breath sounds.  Skin:    General: Skin is warm and dry.  Psychiatric:        Behavior: Behavior normal.     Results for orders placed or performed in visit on 09/22/18  Testosterone,Free and Total  Result Value Ref Range   Testosterone 262 (L) 264 - 916 ng/dL   Testosterone, Free 9.2 7.2 - 24.0 pg/mL      Assessment & Plan:   1. Primary insomnia - Eszopiclone (ESZOPICLONE) 3 MG TABS; Take 1 tablet (3 mg total) by mouth at bedtime. Take immediately before bedtime  Dispense: 30 tablet; Refill: 5  2. Hypertriglyceridemia - fenofibrate micronized (LOFIBRA) 134 MG capsule; Take 1 capsule (134 mg total) by mouth daily before breakfast.  Dispense: 30 capsule; Refill: 11  3. Well adult exam - CMP14+EGFR; Future - Lipid panel; Future - CBC with Differential/Platelet; Future - PSA; Future  4. Screening for colon cancer - Cologuard; Future  5. Hypogonadism in male - testosterone cypionate (DEPOTESTOSTERONE CYPIONATE) 200 MG/ML injection; Inject 1 mL (200 mg total) into the muscle every 14 (fourteen) days.  Dispense: 10 mL; Refill: 5 - Testosterone; Future   Continue all other maintenance medications as listed above.  Follow up plan: Recheck 6 months  Educational handout given for [place] PA-C [place] [address]  [place], [place] [address] [phone]   03/21/2019, 9:56 PM

## 2019-04-15 ENCOUNTER — Encounter: Payer: Self-pay | Admitting: Physician Assistant

## 2019-04-17 ENCOUNTER — Other Ambulatory Visit: Payer: Self-pay

## 2019-04-17 ENCOUNTER — Encounter: Payer: Self-pay | Admitting: Physician Assistant

## 2019-04-17 ENCOUNTER — Ambulatory Visit (INDEPENDENT_AMBULATORY_CARE_PROVIDER_SITE_OTHER): Payer: 59 | Admitting: Physician Assistant

## 2019-04-17 VITALS — BP 118/72 | HR 67 | Temp 98.5°F | Ht 72.0 in | Wt 199.4 lb

## 2019-04-17 DIAGNOSIS — F419 Anxiety disorder, unspecified: Secondary | ICD-10-CM

## 2019-04-17 MED ORDER — ALPRAZOLAM 1 MG PO TABS: 1.0000 mg | ORAL_TABLET | Freq: Two times a day (BID) | ORAL | 2 refills | Status: DC | PRN

## 2019-04-17 MED ORDER — BUSPIRONE HCL 10 MG PO TABS: 10.0000 mg | ORAL_TABLET | Freq: Three times a day (TID) | ORAL | 5 refills | Status: DC

## 2019-04-17 NOTE — Patient Instructions (Signed)

## 2019-04-19 ENCOUNTER — Encounter: Payer: Self-pay | Admitting: Physician Assistant

## 2019-04-19 ENCOUNTER — Other Ambulatory Visit: Payer: Self-pay

## 2019-04-19 ENCOUNTER — Other Ambulatory Visit: Payer: 59

## 2019-04-19 DIAGNOSIS — Z1211 Encounter for screening for malignant neoplasm of colon: Secondary | ICD-10-CM

## 2019-04-19 DIAGNOSIS — Z0000 Encounter for general adult medical examination without abnormal findings: Secondary | ICD-10-CM

## 2019-04-19 DIAGNOSIS — E291 Testicular hypofunction: Secondary | ICD-10-CM

## 2019-04-19 NOTE — Progress Notes (Signed)
BP 118/72   Pulse 67   Temp 98.5 F (36.9 C) (Oral)   Ht 6' (1.829 m)   Wt 199 lb 6.4 oz (90.4 kg)   BMI 27.04 kg/m    Subjective:    Patient ID: Devin Ryan, male    DOB: [birth year], 53 y.o.   MRN: [id number]  HPI: Devin Ryan is a 53 y.o. male presenting on 04/17/2019 for Anxiety  GAD 7 : Generalized Anxiety Score 04/17/2019  Nervous, Anxious, on Edge 3  Control/stop worrying 2  Worry too much - different things 3  Trouble relaxing 3  Restless 0  Easily annoyed or irritable 2  Afraid - awful might happen 0  Total GAD 7 Score 13    He has had a great resurgence of his anxiety symptoms.  He is in a situation that definitely causes this to be much worse.  He is not able to get out of the situation at this time.  He states that he is having interrupted sleep again.  He had done very well in the past with some Xanax on occasional basis to recommend try that but I am also going to have him start BuSpar for more sustained treatment.  Past Medical History:  Diagnosis Date  . Anxiety   . Diverticulitis   . Eczema 11/30/2018  . Headache 11/30/2018   Relevant past medical, surgical, family and social history reviewed and updated as indicated. Interim medical history since our last visit reviewed. Allergies and medications reviewed and updated. DATA REVIEWED: CHART IN EPIC  Family History reviewed for pertinent findings.  Review of Systems  Constitutional: Negative.  Negative for appetite change and fatigue.  Eyes: Negative for pain and visual disturbance.  Respiratory: Negative.  Negative for cough, chest tightness, shortness of breath and wheezing.   Cardiovascular: Negative.  Negative for chest pain, palpitations and leg swelling.  Gastrointestinal: Negative.  Negative for abdominal pain, diarrhea, nausea and vomiting.  Genitourinary: Negative.   Skin: Negative.  Negative for color change and rash.  Neurological: Negative.  Negative for weakness, numbness and headaches.   Psychiatric/Behavioral: Positive for decreased concentration and sleep disturbance. Negative for suicidal ideas. The patient is nervous/anxious.     Allergies as of 04/17/2019   No Known Allergies     Medication List       Accurate as of April 17, 2019 11:59 PM. If you have any questions, ask your nurse or doctor.        STOP taking these medications   doxycycline 100 MG tablet Commonly known as: VIBRA-TABS Stopped by: Terald Sleeper, PA-C     TAKE these medications   ALPRAZolam 1 MG tablet Commonly known as: Xanax Take 1 tablet (1 mg total) by mouth 2 (two) times daily as needed for anxiety. Started by: Terald Sleeper, PA-C   betamethasone dipropionate 0.05 % cream Commonly known as: DIPROLENE Apply topically 2 (two) times daily.   busPIRone 10 MG tablet Commonly known as: BUSPAR Take 1 tablet (10 mg total) by mouth 3 (three) times daily. Started by: Terald Sleeper, PA-C   Eszopiclone 3 MG Tabs Commonly known as: eszopiclone Take 1 tablet (3 mg total) by mouth at bedtime. Take immediately before bedtime   fenofibrate micronized 134 MG capsule Commonly known as: LOFIBRA Take 1 capsule (134 mg total) by mouth daily before breakfast.   ibuprofen 200 MG tablet Commonly known as: ADVIL Take 800 mg by mouth every 6 (six) hours as needed for moderate  pain.   ProAir HFA 108 (90 Base) MCG/ACT inhaler Generic drug: albuterol Inhale 1 puff into the lungs every 6 (six) hours as needed. Shortness of breath   SYRINGE 3CC/25GX1-1/2" 25G X 1-1/2" 3 ML Misc 1 Units by Does not apply route every 21 ( twenty-one) days.   testosterone cypionate 200 MG/ML injection Commonly known as: DEPOTESTOSTERONE CYPIONATE Inject 1 mL (200 mg total) into the muscle every 14 (fourteen) days.          Objective:    BP 118/72   Pulse 67   Temp 98.5 F (36.9 C) (Oral)   Ht 6' (1.829 m)   Wt 199 lb 6.4 oz (90.4 kg)   BMI 27.04 kg/m   No Known Allergies  Wt Readings from Last 3  Encounters:  04/17/19 199 lb 6.4 oz (90.4 kg)  03/20/19 206 lb (93.4 kg)  09/18/18 225 lb 6.4 oz (102.2 kg)    Physical Exam Vitals signs and nursing note reviewed.  Constitutional:      General: He is not in acute distress.    Appearance: He is well-developed.  HENT:     Head: Normocephalic and atraumatic.  Eyes:     Conjunctiva/sclera: Conjunctivae normal.     Pupils: Pupils are equal, round, and reactive to light.  Cardiovascular:     Rate and Rhythm: Normal rate and regular rhythm.     Heart sounds: Normal heart sounds.  Pulmonary:     Effort: Pulmonary effort is normal. No respiratory distress.     Breath sounds: Normal breath sounds.  Skin:    General: Skin is warm and dry.  Psychiatric:        Behavior: Behavior normal.     Results for orders placed or performed in visit on 09/22/18  Testosterone,Free and Total  Result Value Ref Range   Testosterone 262 (L) 264 - 916 ng/dL   Testosterone, Free 9.2 7.2 - 24.0 pg/mL      Assessment & Plan:   1. Anxiety - ALPRAZolam (XANAX) 1 MG tablet; Take 1 tablet (1 mg total) by mouth 2 (two) times daily as needed for anxiety.  Dispense: 60 tablet; Refill: 2 - busPIRone (BUSPAR) 10 MG tablet; Take 1 tablet (10 mg total) by mouth 3 (three) times daily.  Dispense: 90 tablet; Refill: 5   Continue all other maintenance medications as listed above.  Follow up plan: Return in about 2 months (around 06/18/2019).  Educational handout given for [place] PA-C [place] [address]  [place], [place] [address] [phone]   04/19/2019, 2:52 PM

## 2019-04-20 LAB — LIPID PANEL
Chol/HDL Ratio: 4.3 ratio (ref 0.0–5.0)
Cholesterol, Total: 119 mg/dL (ref 100–199)
HDL: 28 mg/dL — ABNORMAL LOW (ref >39)
LDL Calculated: 47 mg/dL (ref 0–99)
Triglycerides: 220 mg/dL — ABNORMAL HIGH (ref 0–149)
VLDL Cholesterol Cal: 44 mg/dL — ABNORMAL HIGH (ref 5–40)

## 2019-04-20 LAB — CBC WITH DIFFERENTIAL/PLATELET
Basophils Absolute: 0.1 10*3/uL (ref 0.0–0.2)
Basos: 1 %
EOS (ABSOLUTE): 0.5 10*3/uL — ABNORMAL HIGH (ref 0.0–0.4)
Eos: 6 %
Hematocrit: 52.8 % — ABNORMAL HIGH (ref 37.5–51.0)
Hemoglobin: 18.4 g/dL — ABNORMAL HIGH (ref 13.0–17.7)
Immature Grans (Abs): 0 10*3/uL (ref 0.0–0.1)
Immature Granulocytes: 0 %
Lymphocytes Absolute: 2.3 10*3/uL (ref 0.7–3.1)
Lymphs: 28 %
MCH: 32.6 pg (ref 26.6–33.0)
MCHC: 34.8 g/dL (ref 31.5–35.7)
MCV: 94 fL (ref 79–97)
Monocytes Absolute: 0.9 10*3/uL (ref 0.1–0.9)
Monocytes: 11 %
Neutrophils Absolute: 4.4 10*3/uL (ref 1.4–7.0)
Neutrophils: 54 %
Platelets: 270 10*3/uL (ref 150–450)
RBC: 5.64 x10E6/uL (ref 4.14–5.80)
RDW: 12 % (ref 11.6–15.4)
WBC: 8.3 10*3/uL (ref 3.4–10.8)

## 2019-04-20 LAB — CMP14+EGFR
ALT: 29 IU/L (ref 0–44)
AST: 26 IU/L (ref 0–40)
Albumin/Globulin Ratio: 1.9 (ref 1.2–2.2)
Albumin: 4.5 g/dL (ref 3.8–4.9)
Alkaline Phosphatase: 54 IU/L (ref 39–117)
BUN/Creatinine Ratio: 8 — ABNORMAL LOW (ref 9–20)
BUN: 9 mg/dL (ref 6–24)
Bilirubin Total: 0.3 mg/dL (ref 0.0–1.2)
CO2: 21 mmol/L (ref 20–29)
Calcium: 9.2 mg/dL (ref 8.7–10.2)
Chloride: 100 mmol/L (ref 96–106)
Creatinine, Ser: 1.11 mg/dL (ref 0.76–1.27)
GFR calc Af Amer: 88 mL/min/{1.73_m2} (ref >59)
GFR calc non Af Amer: 76 mL/min/{1.73_m2} (ref >59)
Globulin, Total: 2.4 g/dL (ref 1.5–4.5)
Glucose: 130 mg/dL — ABNORMAL HIGH (ref 65–99)
Potassium: 4.3 mmol/L (ref 3.5–5.2)
Sodium: 139 mmol/L (ref 134–144)
Total Protein: 6.9 g/dL (ref 6.0–8.5)

## 2019-04-20 LAB — PSA: Prostate Specific Ag, Serum: 0.6 ng/mL (ref 0.0–4.0)

## 2019-04-20 LAB — TESTOSTERONE: Testosterone: 290 ng/dL (ref 264–916)

## 2019-07-13 ENCOUNTER — Encounter: Payer: Self-pay | Admitting: Physician Assistant

## 2019-07-15 ENCOUNTER — Other Ambulatory Visit: Payer: Self-pay | Admitting: Physician Assistant

## 2019-07-15 ENCOUNTER — Encounter: Payer: Self-pay | Admitting: Physician Assistant

## 2019-07-15 MED ORDER — CEPHALEXIN 500 MG PO CAPS: 500.0000 mg | ORAL_CAPSULE | Freq: Four times a day (QID) | ORAL | 0 refills | Status: DC

## 2019-08-08 ENCOUNTER — Other Ambulatory Visit: Payer: Self-pay | Admitting: Physician Assistant

## 2019-08-08 DIAGNOSIS — F419 Anxiety disorder, unspecified: Secondary | ICD-10-CM

## 2019-08-13 ENCOUNTER — Encounter: Payer: Self-pay | Admitting: Physician Assistant

## 2019-08-13 ENCOUNTER — Encounter: Payer: Self-pay | Admitting: *Deleted

## 2019-08-20 ENCOUNTER — Ambulatory Visit: Payer: 59 | Admitting: Physician Assistant

## 2019-08-22 ENCOUNTER — Other Ambulatory Visit: Payer: Self-pay

## 2019-08-23 ENCOUNTER — Ambulatory Visit (INDEPENDENT_AMBULATORY_CARE_PROVIDER_SITE_OTHER): Payer: 59 | Admitting: Physician Assistant

## 2019-08-23 ENCOUNTER — Encounter: Payer: Self-pay | Admitting: Physician Assistant

## 2019-08-23 VITALS — BP 125/72 | HR 57 | Temp 98.0°F | Ht 72.0 in | Wt 197.8 lb

## 2019-08-23 DIAGNOSIS — F419 Anxiety disorder, unspecified: Secondary | ICD-10-CM | POA: Diagnosis not present

## 2019-08-23 DIAGNOSIS — M542 Cervicalgia: Secondary | ICD-10-CM

## 2019-08-23 DIAGNOSIS — F5101 Primary insomnia: Secondary | ICD-10-CM | POA: Diagnosis not present

## 2019-08-23 MED ORDER — ALPRAZOLAM 1 MG PO TABS: 1.0000 mg | ORAL_TABLET | Freq: Two times a day (BID) | ORAL | 2 refills | Status: DC | PRN

## 2019-08-23 MED ORDER — ESZOPICLONE 3 MG PO TABS: 3.0000 mg | ORAL_TABLET | Freq: Every day | ORAL | 5 refills | Status: DC

## 2019-08-23 MED ORDER — PREDNISONE 10 MG (48) PO TBPK: ORAL_TABLET | ORAL | 0 refills | Status: DC

## 2019-08-23 MED ORDER — BUSPIRONE HCL 15 MG PO TABS: 15.0000 mg | ORAL_TABLET | Freq: Three times a day (TID) | ORAL | 5 refills | Status: DC

## 2019-08-23 MED ORDER — CYCLOBENZAPRINE HCL 10 MG PO TABS: 10.0000 mg | ORAL_TABLET | Freq: Three times a day (TID) | ORAL | 0 refills | Status: DC | PRN

## 2019-08-23 NOTE — Progress Notes (Signed)
BP 125/72   Pulse (!) 57   Temp 98 F (36.7 C) (Temporal)   Ht 6' (1.829 m)   Wt 197 lb 12.8 oz (89.7 kg)   SpO2 97%   BMI 26.83 kg/m    Subjective:    Patient ID: Devin Ryan, male    DOB: 1966-05-09, 53 y.o.   MRN: [id number]  HPI: Devin Ryan is a 53 y.o. male presenting on 08/23/2019 for Anxiety and Medication Refill  This patient comes in to have a recheck on his chronic medical conditions they do include depression, anxiety, and chronic insomnia.  He also is having a flareup of his neck pain.  Is just been using over-the-counter medicines for that.  Depression screen [facility] 2/9 08/23/2019 04/17/2019 09/18/2018 10/24/2017 03/22/2017  Decreased Interest 0 0 0 0 0  Down, Depressed, Hopeless 0 0 0 0 0  PHQ - 2 Score 0 0 0 0 0  Altered sleeping 1 - - - -  Tired, decreased energy 2 - - - -  Change in appetite 0 - - - -  Feeling bad or failure about yourself  0 - - - -  Trouble concentrating 0 - - - -  Moving slowly or fidgety/restless 0 - - - -  Suicidal thoughts 0 - - - -  PHQ-9 Score 3 - - - -  Difficult doing work/chores Somewhat difficult - - - -   GAD 7 : Generalized Anxiety Score 08/23/2019 04/17/2019  Nervous, Anxious, on Edge 1 3  Control/stop worrying 1 2  Worry too much - different things 1 3  Trouble relaxing 1 3  Restless 0 0  Easily annoyed or irritable 1 2  Afraid - awful might happen 0 0  Total GAD 7 Score 5 13  Anxiety Difficulty Somewhat difficult -      Past Medical History:  Diagnosis Date  . Anxiety   . Diverticulitis   . Eczema 11/30/2018  . Headache 11/30/2018   Relevant past medical, surgical, family and social history reviewed and updated as indicated. Interim medical history since our last visit reviewed. Allergies and medications reviewed and updated. DATA REVIEWED: CHART IN EPIC  Family History reviewed for pertinent findings.  Review of Systems  Constitutional: Negative.  Negative for appetite change and fatigue.  Eyes:  Negative for pain and visual disturbance.  Respiratory: Negative.  Negative for cough, chest tightness, shortness of breath and wheezing.   Cardiovascular: Negative.  Negative for chest pain, palpitations and leg swelling.  Gastrointestinal: Negative.  Negative for abdominal pain, diarrhea, nausea and vomiting.  Genitourinary: Negative.   Skin: Negative.  Negative for color change and rash.  Neurological: Negative.  Negative for weakness, numbness and headaches.  Psychiatric/Behavioral: Negative.     Allergies as of 08/23/2019   No Known Allergies     Medication List       Accurate as of August 23, 2019 11:59 PM. If you have any questions, ask your nurse or doctor.        STOP taking these medications   cephALEXin 500 MG capsule Commonly known as: KEFLEX Stopped by: Terald Sleeper, PA-C   SYRINGE 3CC/25GX1-1/2" 25G X 1-1/2" 3 ML Misc Stopped by: Terald Sleeper, PA-C   testosterone cypionate 200 MG/ML injection Commonly known as: DEPOTESTOSTERONE CYPIONATE Stopped by: Terald Sleeper, PA-C     TAKE these medications   ALPRAZolam 1 MG tablet Commonly known as: Xanax Take 1 tablet (1 mg total)  by mouth 2 (two) times daily as needed for anxiety.   betamethasone dipropionate 0.05 % cream Apply topically 2 (two) times daily.   busPIRone 15 MG tablet Commonly known as: BUSPAR Take 1 tablet (15 mg total) by mouth 3 (three) times daily. What changed:   medication strength  how much to take Changed by: Terald Sleeper, PA-C   cyclobenzaprine 10 MG tablet Commonly known as: FLEXERIL Take 1 tablet (10 mg total) by mouth 3 (three) times daily as needed for muscle spasms. Started by: Terald Sleeper, PA-C   Eszopiclone 3 MG Tabs Commonly known as: eszopiclone Take 1 tablet (3 mg total) by mouth at bedtime. Take immediately before bedtime   fenofibrate micronized 134 MG capsule Commonly known as: LOFIBRA Take 1 capsule (134 mg total) by mouth daily before breakfast.    ibuprofen 200 MG tablet Commonly known as: ADVIL Take 800 mg by mouth every 6 (six) hours as needed for moderate pain.   predniSONE 10 MG (48) Tbpk tablet Commonly known as: STERAPRED UNI-PAK 48 TAB Take as directed for 12 days Started by: Terald Sleeper, PA-C   ProAir HFA 108 [phone] Base) MCG/ACT inhaler Generic drug: albuterol Inhale 1 puff into the lungs every 6 (six) hours as needed. Shortness of breath          Objective:    BP 125/72   Pulse (!) 57   Temp 98 F (36.7 C) (Temporal)   Ht 6' (1.829 m)   Wt 197 lb 12.8 oz (89.7 kg)   SpO2 97%   BMI 26.83 kg/m   No Known Allergies  Wt Readings from Last 3 Encounters:  08/23/19 197 lb 12.8 oz (89.7 kg)  04/17/19 199 lb 6.4 oz (90.4 kg)  03/20/19 206 lb (93.4 kg)    Physical Exam Vitals signs and nursing note reviewed.  Constitutional:      General: He is not in acute distress.    Appearance: He is well-developed.  HENT:     Head: Normocephalic and atraumatic.  Eyes:     Conjunctiva/sclera: Conjunctivae normal.     Pupils: Pupils are equal, round, and reactive to light.  Cardiovascular:     Rate and Rhythm: Normal rate and regular rhythm.     Heart sounds: Normal heart sounds.  Pulmonary:     Effort: Pulmonary effort is normal. No respiratory distress.     Breath sounds: Normal breath sounds.  Skin:    General: Skin is warm and dry.  Psychiatric:        Behavior: Behavior normal.     Results for orders placed or performed in visit on 04/19/19  Testosterone  Result Value Ref Range   Testosterone 290 264 - 916 ng/dL  PSA  Result Value Ref Range   Prostate Specific Ag, Serum 0.6 0.0 - 4.0 ng/mL  CBC with Differential/Platelet  Result Value Ref Range   WBC 8.3 3.4 - 10.8 x10E3/uL   RBC 5.64 4.14 - 5.80 x10E6/uL   Hemoglobin 18.4 (H) 13.0 - 17.7 g/dL   Hematocrit 52.8 (H) 37.5 - 51.0 %   MCV 94 79 - 97 fL   MCH 32.6 26.6 - 33.0 pg   MCHC 34.8 31.5 - 35.7 g/dL   RDW 12.0 11.6 - 15.4 %   Platelets 270  150 - 450 x10E3/uL   Neutrophils 54 Not Estab. %   Lymphs 28 Not Estab. %   Monocytes 11 Not Estab. %   Eos 6 Not Estab. %  Basos 1 Not Estab. %   Neutrophils Absolute 4.4 1.4 - 7.0 x10E3/uL   Lymphocytes Absolute 2.3 0.7 - 3.1 x10E3/uL   Monocytes Absolute 0.9 0.1 - 0.9 x10E3/uL   EOS (ABSOLUTE) 0.5 (H) 0.0 - 0.4 x10E3/uL   Basophils Absolute 0.1 0.0 - 0.2 x10E3/uL   Immature Granulocytes 0 Not Estab. %   Immature Grans (Abs) 0.0 0.0 - 0.1 x10E3/uL  Lipid panel  Result Value Ref Range   Cholesterol, Total 119 100 - 199 mg/dL   Triglycerides 220 (H) 0 - 149 mg/dL   HDL 28 (L) >39 mg/dL   VLDL Cholesterol Cal 44 (H) 5 - 40 mg/dL   LDL Calculated 47 0 - 99 mg/dL   Chol/HDL Ratio 4.3 0.0 - 5.0 ratio  CMP14+EGFR  Result Value Ref Range   Glucose 130 (H) 65 - 99 mg/dL   BUN 9 6 - 24 mg/dL   Creatinine, Ser 1.11 0.76 - 1.27 mg/dL   GFR calc non Af Amer 76 >59 mL/min/1.73   GFR calc Af Amer 88 >59 mL/min/1.73   BUN/Creatinine Ratio 8 (L) 9 - 20   Sodium 139 134 - 144 mmol/L   Potassium 4.3 3.5 - 5.2 mmol/L   Chloride 100 96 - 106 mmol/L   CO2 21 20 - 29 mmol/L   Calcium 9.2 8.7 - 10.2 mg/dL   Total Protein 6.9 6.0 - 8.5 g/dL   Albumin 4.5 3.8 - 4.9 g/dL   Globulin, Total 2.4 1.5 - 4.5 g/dL   Albumin/Globulin Ratio 1.9 1.2 - 2.2   Bilirubin Total 0.3 0.0 - 1.2 mg/dL   Alkaline Phosphatase 54 39 - 117 IU/L   AST 26 0 - 40 IU/L   ALT 29 0 - 44 IU/L      Assessment & Plan:   1. Anxiety - busPIRone (BUSPAR) 15 MG tablet; Take 1 tablet (15 mg total) by mouth 3 (three) times daily.  Dispense: 90 tablet; Refill: 5 - ALPRAZolam (XANAX) 1 MG tablet; Take 1 tablet (1 mg total) by mouth 2 (two) times daily as needed for anxiety.  Dispense: 60 tablet; Refill: 2  2. Primary insomnia - Eszopiclone (ESZOPICLONE) 3 MG TABS; Take 1 tablet (3 mg total) by mouth at bedtime. Take immediately before bedtime  Dispense: 30 tablet; Refill: 5  3. Neck pain - predniSONE (STERAPRED UNI-PAK 48  TAB) 10 MG (48) TBPK tablet; Take as directed for 12 days  Dispense: 48 tablet; Refill: 0 - cyclobenzaprine (FLEXERIL) 10 MG tablet; Take 1 tablet (10 mg total) by mouth 3 (three) times daily as needed for muscle spasms.  Dispense: 60 tablet; Refill: 0   Continue all other maintenance medications as listed above.  Follow up plan: Return in about 6 months (around 02/20/2020).  Educational handout given for [place] PA-C [place] [address]  [place], [place] [address] [phone]   08/30/2019, 5:37 PM

## 2019-09-16 ENCOUNTER — Encounter: Payer: Self-pay | Admitting: Physician Assistant

## 2019-09-19 ENCOUNTER — Other Ambulatory Visit: Payer: Self-pay

## 2019-09-20 ENCOUNTER — Ambulatory Visit (INDEPENDENT_AMBULATORY_CARE_PROVIDER_SITE_OTHER): Payer: 59 | Admitting: Physician Assistant

## 2019-09-20 ENCOUNTER — Encounter: Payer: Self-pay | Admitting: Physician Assistant

## 2019-09-20 DIAGNOSIS — F419 Anxiety disorder, unspecified: Secondary | ICD-10-CM | POA: Diagnosis not present

## 2019-09-20 MED ORDER — PROAIR HFA 108 (90 BASE) MCG/ACT IN AERS: 1.0000 | INHALATION_SPRAY | Freq: Four times a day (QID) | RESPIRATORY_TRACT | 1 refills | Status: DC | PRN

## 2019-09-20 MED ORDER — ALPRAZOLAM 1 MG PO TABS: 1.0000 mg | ORAL_TABLET | Freq: Two times a day (BID) | ORAL | 5 refills | Status: DC | PRN

## 2019-09-22 NOTE — Progress Notes (Signed)
BP 138/76   Pulse 73   Temp 99 F (37.2 C)   Resp 20   Ht 6' (1.829 m)   Wt 202 lb (91.6 kg)   SpO2 97%   BMI 27.40 kg/m    Subjective:    Patient ID: Devin Ryan, male    DOB: [birth year], 53 y.o.   MRN: [id number]  HPI: Devin Ryan is a 52 y.o. male presenting on 09/20/2019 for Medical Management of Chronic Issues and Medication Refill  ANXIETY ASSESSMENT Cause of anxiety: GAD This patient returns for a  month recheck on narcotic use for the above named condition(s)  Current medications-alprazolam 1 mg 1 up to 5 times a day BuSpar 15 mg 3 times a day Next step is to lower to by 1/2 tab daily in the next 3 months. Intolerant to SSRIs, SNRIs. Medication side effects- no Any concerns- no Any change in general medical condition- no Effectiveness of current meds- good PMP AWARE website reviewed: Yes Any suspicious activity on PMP Aware: No LME daily dose: 4  Contract on file 09/20/2019 Last UDS  09/19/2001  History of overdose or risk of abuse no   Past Medical History:  Diagnosis Date  . Anxiety   . Diverticulitis   . Eczema 11/30/2018  . Headache 11/30/2018   Relevant past medical, surgical, family and social history reviewed and updated as indicated. Interim medical history since our last visit reviewed. Allergies and medications reviewed and updated. DATA REVIEWED: CHART IN EPIC  Family History reviewed for pertinent findings.  Review of Systems  Constitutional: Negative.  Negative for appetite change and fatigue.  Eyes: Negative for pain and visual disturbance.  Respiratory: Negative.  Negative for cough, chest tightness, shortness of breath and wheezing.   Cardiovascular: Negative.  Negative for chest pain, palpitations and leg swelling.  Gastrointestinal: Negative.  Negative for abdominal pain, diarrhea, nausea and vomiting.  Genitourinary: Negative.   Skin: Negative.  Negative for color change and rash.  Neurological: Negative.  Negative for  weakness, numbness and headaches.  Psychiatric/Behavioral: Negative.     Allergies as of 09/20/2019   No Known Allergies     Medication List       Accurate as of September 20, 2019 11:59 PM. If you have any questions, ask your nurse or doctor.        STOP taking these medications   predniSONE 10 MG (48) Tbpk tablet Commonly known as: STERAPRED UNI-PAK 48 TAB Stopped by: Terald Sleeper, PA-C     TAKE these medications   ALPRAZolam 1 MG tablet Commonly known as: Xanax Take 1 tablet (1 mg total) by mouth 2 (two) times daily as needed for anxiety.   betamethasone dipropionate 0.05 % cream Apply topically 2 (two) times daily.   busPIRone 15 MG tablet Commonly known as: BUSPAR Take 1 tablet (15 mg total) by mouth 3 (three) times daily.   cyclobenzaprine 10 MG tablet Commonly known as: FLEXERIL Take 1 tablet (10 mg total) by mouth 3 (three) times daily as needed for muscle spasms.   Eszopiclone 3 MG Tabs Commonly known as: eszopiclone Take 1 tablet (3 mg total) by mouth at bedtime. Take immediately before bedtime   fenofibrate micronized 134 MG capsule Commonly known as: LOFIBRA Take 1 capsule (134 mg total) by mouth daily before breakfast.   ibuprofen 200 MG tablet Commonly known as: ADVIL Take 800 mg by mouth every 6 (six) hours as needed for moderate pain.   ProAir HFA 108 (90  Base) MCG/ACT inhaler Generic drug: albuterol Inhale 1 puff into the lungs every 6 (six) hours as needed. Shortness of breath          Objective:    BP 138/76   Pulse 73   Temp 99 F (37.2 C)   Resp 20   Ht 6' (1.829 m)   Wt 202 lb (91.6 kg)   SpO2 97%   BMI 27.40 kg/m   No Known Allergies  Wt Readings from Last 3 Encounters:  09/20/19 202 lb (91.6 kg)  08/23/19 197 lb 12.8 oz (89.7 kg)  04/17/19 199 lb 6.4 oz (90.4 kg)    Physical Exam Vitals and nursing note reviewed.  Constitutional:      General: He is not in acute distress.    Appearance: He is well-developed.   HENT:     Head: Normocephalic and atraumatic.  Eyes:     Conjunctiva/sclera: Conjunctivae normal.     Pupils: Pupils are equal, round, and reactive to light.  Cardiovascular:     Rate and Rhythm: Normal rate and regular rhythm.     Heart sounds: Normal heart sounds.  Pulmonary:     Effort: Pulmonary effort is normal. No respiratory distress.     Breath sounds: Normal breath sounds.  Skin:    General: Skin is warm and dry.  Psychiatric:        Behavior: Behavior normal.     Results for orders placed or performed in visit on 04/19/19  Testosterone  Result Value Ref Range   Testosterone 290 264 - 916 ng/dL  PSA  Result Value Ref Range   Prostate Specific Ag, Serum 0.6 0.0 - 4.0 ng/mL  CBC with Differential/Platelet  Result Value Ref Range   WBC 8.3 3.4 - 10.8 x10E3/uL   RBC 5.64 4.14 - 5.80 x10E6/uL   Hemoglobin 18.4 (H) 13.0 - 17.7 g/dL   Hematocrit 52.8 (H) 37.5 - 51.0 %   MCV 94 79 - 97 fL   MCH 32.6 26.6 - 33.0 pg   MCHC 34.8 31.5 - 35.7 g/dL   RDW 12.0 11.6 - 15.4 %   Platelets 270 150 - 450 x10E3/uL   Neutrophils 54 Not Estab. %   Lymphs 28 Not Estab. %   Monocytes 11 Not Estab. %   Eos 6 Not Estab. %   Basos 1 Not Estab. %   Neutrophils Absolute 4.4 1.4 - 7.0 x10E3/uL   Lymphocytes Absolute 2.3 0.7 - 3.1 x10E3/uL   Monocytes Absolute 0.9 0.1 - 0.9 x10E3/uL   EOS (ABSOLUTE) 0.5 (H) 0.0 - 0.4 x10E3/uL   Basophils Absolute 0.1 0.0 - 0.2 x10E3/uL   Immature Granulocytes 0 Not Estab. %   Immature Grans (Abs) 0.0 0.0 - 0.1 x10E3/uL  Lipid panel  Result Value Ref Range   Cholesterol, Total 119 100 - 199 mg/dL   Triglycerides 220 (H) 0 - 149 mg/dL   HDL 28 (L) >39 mg/dL   VLDL Cholesterol Cal 44 (H) 5 - 40 mg/dL   LDL Calculated 47 0 - 99 mg/dL   Chol/HDL Ratio 4.3 0.0 - 5.0 ratio  CMP14+EGFR  Result Value Ref Range   Glucose 130 (H) 65 - 99 mg/dL   BUN 9 6 - 24 mg/dL   Creatinine, Ser 1.11 0.76 - 1.27 mg/dL   GFR calc non Af Amer 76 >59 mL/min/1.73   GFR  calc Af Amer 88 >59 mL/min/1.73   BUN/Creatinine Ratio 8 (L) 9 - 20   Sodium 139 134 - 144  mmol/L   Potassium 4.3 3.5 - 5.2 mmol/L   Chloride 100 96 - 106 mmol/L   CO2 21 20 - 29 mmol/L   Calcium 9.2 8.7 - 10.2 mg/dL   Total Protein 6.9 6.0 - 8.5 g/dL   Albumin 4.5 3.8 - 4.9 g/dL   Globulin, Total 2.4 1.5 - 4.5 g/dL   Albumin/Globulin Ratio 1.9 1.2 - 2.2   Bilirubin Total 0.3 0.0 - 1.2 mg/dL   Alkaline Phosphatase 54 39 - 117 IU/L   AST 26 0 - 40 IU/L   ALT 29 0 - 44 IU/L      Assessment & Plan:   1. Anxiety - DRUG SCREEN-TOXASSURE - ALPRAZolam (XANAX) 1 MG tablet; Take 1 tablet (1 mg total) by mouth 2 (two) times daily as needed for anxiety.  Dispense: 60 tablet; Refill: 5   Continue all other maintenance medications as listed above.  Follow up plan: No follow-ups on file.  Educational handout given for [place] PA-C [place] [address]  [place], [place] [address] [phone]   09/22/2019, 5:16 PM

## 2019-09-25 LAB — TOXASSURE SELECT 13 (MW), URINE

## 2019-12-25 ENCOUNTER — Ambulatory Visit (INDEPENDENT_AMBULATORY_CARE_PROVIDER_SITE_OTHER): Payer: Commercial Managed Care - PPO | Admitting: Family Medicine

## 2019-12-25 ENCOUNTER — Encounter: Payer: Self-pay | Admitting: Family Medicine

## 2019-12-25 ENCOUNTER — Encounter: Payer: Self-pay | Admitting: Physician Assistant

## 2019-12-25 DIAGNOSIS — R198 Other specified symptoms and signs involving the digestive system and abdomen: Secondary | ICD-10-CM | POA: Diagnosis not present

## 2019-12-25 DIAGNOSIS — Z8719 Personal history of other diseases of the digestive system: Secondary | ICD-10-CM

## 2019-12-25 DIAGNOSIS — R1032 Left lower quadrant pain: Secondary | ICD-10-CM

## 2019-12-25 MED ORDER — CIPROFLOXACIN HCL 500 MG PO TABS: 500.0000 mg | ORAL_TABLET | Freq: Two times a day (BID) | ORAL | 0 refills | Status: AC

## 2019-12-25 MED ORDER — METRONIDAZOLE 500 MG PO TABS: 500.0000 mg | ORAL_TABLET | Freq: Two times a day (BID) | ORAL | 0 refills | Status: DC

## 2019-12-25 NOTE — Progress Notes (Signed)
Virtual Visit via telephone Note Due to COVID-19 pandemic this visit was conducted virtually. This visit type was conducted due to national recommendations for restrictions regarding the COVID-19 Pandemic (e.g. social distancing, sheltering in place) in an effort to limit this patient's exposure and mitigate transmission in our community. All issues noted in this document were discussed and addressed.  A physical exam was not performed with this format.   I connected with Devin Ryan on 12/25/2019 at 1015 by telephone and verified that I am speaking with the correct person using two identifiers. Devin Ryan is currently located at home and no one is currently with them during visit. The provider, Monia Pouch, FNP is located in their office at time of visit.  I discussed the limitations, risks, security and privacy concerns of performing an evaluation and management service by telephone and the availability of in person appointments. I also discussed with the patient that there may be a patient responsible charge related to this service. The patient expressed understanding and agreed to proceed.  Subjective:  Patient ID: Devin Ryan, male    DOB: [birth year], 54 y.o.   MRN: [id number]  Chief Complaint:  Abdominal Pain   HPI: Devin Ryan is a 54 y.o. male presenting on 12/25/2019 for Abdominal Pain   Abdominal Pain This is a new problem. The current episode started in the past 7 days. The problem occurs constantly. The problem has been gradually worsening. The pain is located in the LLQ and suprapubic region. The pain is at a severity of 4/10. The pain is mild. The quality of the pain is cramping, aching, dull and sharp. The abdominal pain does not radiate. Associated symptoms include constipation, diarrhea and nausea. Pertinent negatives include no anorexia, arthralgias, belching, dysuria, fever, flatus, frequency, headaches, hematochezia, hematuria, melena, myalgias, vomiting or  weight loss. The pain is aggravated by eating, movement and palpation. The pain is relieved by nothing. He has tried acetaminophen for the symptoms. The treatment provided no relief.     Relevant past medical, surgical, family, and social history reviewed and updated as indicated.  Allergies and medications reviewed and updated.   Past Medical History:  Diagnosis Date  . Anxiety   . Diverticulitis   . Eczema 11/30/2018  . Headache 11/30/2018    Past Surgical History:  Procedure Laterality Date  . NOSE SURGERY    . PARTIAL COLECTOMY N/A 09/28/2015   Procedure: PARTIAL COLECTOMY;  Surgeon: Aviva Signs, MD;  Location: [facility];  Service: General;  Laterality: N/A;    Social History   Socioeconomic History  . Marital status: Single    Spouse name: Not on file  . Number of children: Not on file  . Years of education: Not on file  . Highest education level: Not on file  Occupational History  . Not on file  Tobacco Use  . Smoking status: Current Every Day Smoker    Packs/day: 0.50    Years: 30.00    Pack years: 15.00    Types: Cigarettes  . Smokeless tobacco: Never Used  Substance and Sexual Activity  . Alcohol use: Yes    Comment: occ  . Drug use: No  . Sexual activity: Yes    Birth control/protection: None  Other Topics Concern  . Not on file  Social History Narrative  . Not on file   Social Determinants of Health   Financial Resource Strain:   . Difficulty of Paying Living Expenses:   Food Insecurity:   .  Worried About Charity fundraiser in the Last Year:   . Arboriculturist in the Last Year:   Transportation Needs:   . Film/video editor (Medical):   Marland Kitchen Lack of Transportation (Non-Medical):   Physical Activity:   . Days of Exercise per Week:   . Minutes of Exercise per Session:   Stress:   . Feeling of Stress :   Social Connections:   . Frequency of Communication with Friends and Family:   . Frequency of Social Gatherings with Friends and Family:   .  Attends Religious Services:   . Active Member of Clubs or Organizations:   . Attends Archivist Meetings:   Marland Kitchen Marital Status:   Intimate Partner Violence:   . Fear of Current or Ex-Partner:   . Emotionally Abused:   Marland Kitchen Physically Abused:   . Sexually Abused:     Outpatient Encounter Medications as of 12/25/2019  Medication Sig  . ALPRAZolam (XANAX) 1 MG tablet Take 1 tablet (1 mg total) by mouth 2 (two) times daily as needed for anxiety.  . betamethasone dipropionate (DIPROLENE) 0.05 % cream Apply topically 2 (two) times daily.  . busPIRone (BUSPAR) 15 MG tablet Take 1 tablet (15 mg total) by mouth 3 (three) times daily.  . ciprofloxacin (CIPRO) 500 MG tablet Take 1 tablet (500 mg total) by mouth 2 (two) times daily for 7 days.  . cyclobenzaprine (FLEXERIL) 10 MG tablet Take 1 tablet (10 mg total) by mouth 3 (three) times daily as needed for muscle spasms.  . Eszopiclone (ESZOPICLONE) 3 MG TABS Take 1 tablet (3 mg total) by mouth at bedtime. Take immediately before bedtime  . fenofibrate micronized (LOFIBRA) 134 MG capsule Take 1 capsule (134 mg total) by mouth daily before breakfast.  . ibuprofen (ADVIL,MOTRIN) 200 MG tablet Take 800 mg by mouth every 6 (six) hours as needed for moderate pain.  . metroNIDAZOLE (FLAGYL) 500 MG tablet Take 1 tablet (500 mg total) by mouth 2 (two) times daily.  Marland Kitchen PROAIR HFA 108 (90 Base) MCG/ACT inhaler Inhale 1 puff into the lungs every 6 (six) hours as needed. Shortness of breath   No facility-administered encounter medications on file as of 12/25/2019.    No Known Allergies  Review of Systems  Constitutional: Positive for appetite change. Negative for activity change, chills, diaphoresis, fatigue, fever, unexpected weight change and weight loss.  HENT: Negative.   Eyes: Negative.  Negative for photophobia and visual disturbance.  Respiratory: Negative for cough, chest tightness and shortness of breath.   Cardiovascular: Negative for chest  pain, palpitations and leg swelling.  Gastrointestinal: Positive for abdominal pain, constipation, diarrhea and nausea. Negative for abdominal distention, anal bleeding, anorexia, blood in stool, flatus, hematochezia, melena, rectal pain and vomiting.  Endocrine: Negative.   Genitourinary: Negative for decreased urine volume, difficulty urinating, dysuria, frequency, hematuria and urgency.  Musculoskeletal: Negative for arthralgias and myalgias.  Skin: Negative.   Allergic/Immunologic: Negative.   Neurological: Negative for dizziness, tremors, seizures, syncope, facial asymmetry, speech difficulty, weakness, light-headedness, numbness and headaches.  Hematological: Negative.   Psychiatric/Behavioral: Negative for confusion, hallucinations, sleep disturbance and suicidal ideas.  All other systems reviewed and are negative.        Observations/Objective: No vital signs or physical exam, this was a telephone or virtual health encounter.  Pt alert and oriented, answers all questions appropriately, and able to speak in full sentences.    Assessment and Plan: [place] was seen today for abdominal pain.  Diagnoses and all orders for this visit:  LLQ pain Abnormal bowel movement History of diverticulitis Pt with LLQ abdominal pain, urge to defecate, mixed diarrhea and constipation, and nausea with a known history of diverticulitis. Reported symptoms consistent with and concerning for recurrent diverticulitis. Will initiate below. Pt aware to report any new, worsening, or persistent symptoms. Medications as prescribed. Symptomatic care discussed in detail.  -     metroNIDAZOLE (FLAGYL) 500 MG tablet; Take 1 tablet (500 mg total) by mouth 2 (two) times daily. -     ciprofloxacin (CIPRO) 500 MG tablet; Take 1 tablet (500 mg total) by mouth 2 (two) times daily for 7 days.     Follow Up Instructions: Return if symptoms worsen or fail to improve.    I discussed the assessment and treatment  plan with the patient. The patient was provided an opportunity to ask questions and all were answered. The patient agreed with the plan and demonstrated an understanding of the instructions.   The patient was advised to call back or seek an in-person evaluation if the symptoms worsen or if the condition fails to improve as anticipated.  The above assessment and management plan was discussed with the patient. The patient verbalized understanding of and has agreed to the management plan. Patient is aware to call the clinic if they develop any new symptoms or if symptoms persist or worsen. Patient is aware when to return to the clinic for a follow-up visit. Patient educated on when it is appropriate to go to the emergency department.    I provided 15 minutes of non-face-to-face time during this encounter. The call started at 1015. The call ended at 1030. The other time was used for coordination of care.    Monia Pouch, FNP-C [facility] [address] [place], [place] [address] [phone] 12/25/2019

## 2020-01-27 ENCOUNTER — Encounter: Payer: Self-pay | Admitting: *Deleted

## 2020-02-21 ENCOUNTER — Other Ambulatory Visit: Payer: Self-pay

## 2020-02-21 ENCOUNTER — Encounter: Payer: Self-pay | Admitting: Family Medicine

## 2020-02-21 ENCOUNTER — Ambulatory Visit: Payer: 59 | Admitting: Physician Assistant

## 2020-02-21 ENCOUNTER — Ambulatory Visit (INDEPENDENT_AMBULATORY_CARE_PROVIDER_SITE_OTHER): Payer: Commercial Managed Care - PPO | Admitting: Family Medicine

## 2020-02-21 VITALS — BP 123/75 | HR 74 | Temp 97.0°F | Ht 73.0 in | Wt 205.0 lb

## 2020-02-21 DIAGNOSIS — F5101 Primary insomnia: Secondary | ICD-10-CM | POA: Diagnosis not present

## 2020-02-21 DIAGNOSIS — Z79899 Other long term (current) drug therapy: Secondary | ICD-10-CM

## 2020-02-21 DIAGNOSIS — E781 Pure hyperglyceridemia: Secondary | ICD-10-CM

## 2020-02-21 DIAGNOSIS — F419 Anxiety disorder, unspecified: Secondary | ICD-10-CM | POA: Diagnosis not present

## 2020-02-21 MED ORDER — TEMAZEPAM 15 MG PO CAPS: 15.0000 mg | ORAL_CAPSULE | Freq: Every evening | ORAL | 0 refills | Status: DC | PRN

## 2020-02-21 MED ORDER — ALPRAZOLAM 1 MG PO TABS: 1.0000 mg | ORAL_TABLET | Freq: Every day | ORAL | 1 refills | Status: DC | PRN

## 2020-02-21 MED ORDER — FENOFIBRATE 134 MG PO CAPS: 134.0000 mg | ORAL_CAPSULE | Freq: Every day | ORAL | 1 refills | Status: DC

## 2020-02-21 MED ORDER — BUSPIRONE HCL 15 MG PO TABS: 15.0000 mg | ORAL_TABLET | Freq: Three times a day (TID) | ORAL | 1 refills | Status: DC

## 2020-02-21 NOTE — Progress Notes (Signed)
Assessment & Plan:  1. Anxiety - Controlled. Continue buspirone 15 mg TID. Goal is to need Xanax less. Will start with weaning dose he is taking for sleep.  - ALPRAZolam (XANAX) 1 MG tablet; Take 1 tablet (1 mg total) by mouth daily as needed for anxiety.  Dispense: 30 tablet; Refill: 1 - busPIRone (BUSPAR) 15 MG tablet; Take 1 tablet (15 mg total) by mouth 3 (three) times daily.  Dispense: 270 tablet; Refill: 1  2. Primary insomnia - Uncontrolled. Lunesta D/C'd since it is not helping. Xanax at night D/C'd since it is not appropriate. Rx'd temazepam.  - temazepam (RESTORIL) 15 MG capsule; Take 1 capsule (15 mg total) by mouth at bedtime as needed for sleep.  Dispense: 14 capsule; Refill: 0  3. Controlled substance agreement signed - Signed for temazepam and Xanax. Plan to wean Xanax. UDS completed on 09/20/2019.  4. Hypertriglyceridemia - Well controlled on current regimen.  - fenofibrate micronized (LOFIBRA) 134 MG capsule; Take 1 capsule (134 mg total) by mouth daily before breakfast.  Dispense: 90 capsule; Refill: 1  CBC, CMP, and Lipid panel ordered that patient will come back for fasting.    Return in about 13 days (around 03/05/2020) for sleep.  Hendricks Limes, MSN, APRN, FNP-C [facility]  Subjective:    Patient ID: Devin Ryan, male    DOB: Nov 12, 1965, 54 y.o.   MRN: [id number]  Patient Care Team: [name], FNP as PCP - General (Family Medicine)   Chief Complaint:  Chief Complaint  Patient presents with  . Establish Care    jones   . Anxiety    HPI: Devin Ryan is a 54 y.o. male presenting on 02/21/2020 for Establish Care (jones ) and Anxiety  Patient takes Lunesta 3 mg at bedtime for sleep.  He reports it used to work well and then he got to where he could no longer sleep through the night, so he started adding a Xanax to the [place] so that he could sleep through the night.  He takes the other Xanax in the middle of the day as  needed when he feels irritation coming on.  States that he gets very frustrated at work and with the way things are going in the world today.  Patient has previously failed therapy with Ambien as he reports it did not work and he was awake within 2 hours.   Social history:  Relevant past medical, surgical, family and social history reviewed and updated as indicated. Interim medical history since our last visit reviewed.  Allergies and medications reviewed and updated.  DATA REVIEWED: CHART IN EPIC  ROS: Negative unless specifically indicated above in HPI.    Current Outpatient Medications:  .  ALPRAZolam (XANAX) 1 MG tablet, Take 1 tablet (1 mg total) by mouth daily as needed for anxiety., Disp: 30 tablet, Rfl: 1 .  busPIRone (BUSPAR) 15 MG tablet, Take 1 tablet (15 mg total) by mouth 3 (three) times daily., Disp: 270 tablet, Rfl: 1 .  fenofibrate micronized (LOFIBRA) 134 MG capsule, Take 1 capsule (134 mg total) by mouth daily before breakfast., Disp: 90 capsule, Rfl: 1 .  ibuprofen (ADVIL,MOTRIN) 200 MG tablet, Take 800 mg by mouth every 6 (six) hours as needed for moderate pain., Disp: , Rfl:  .  PROAIR HFA 108 (90 Base) MCG/ACT inhaler, Inhale 1 puff into the lungs every 6 (six) hours as needed. Shortness of breath, Disp: 18 g, Rfl: 1 .  temazepam (RESTORIL)  15 MG capsule, Take 1 capsule (15 mg total) by mouth at bedtime as needed for sleep., Disp: 14 capsule, Rfl: 0   No Known Allergies Past Medical History:  Diagnosis Date  . Anxiety   . Diverticulitis   . Eczema 11/30/2018  . Headache 11/30/2018    Past Surgical History:  Procedure Laterality Date  . NOSE SURGERY    . PARTIAL COLECTOMY N/A 09/28/2015   Procedure: PARTIAL COLECTOMY;  Surgeon: Aviva Signs, MD;  Location: [facility];  Service: General;  Laterality: N/A;    Social History   Socioeconomic History  . Marital status: Single    Spouse name: Not on file  . Number of children: Not on file  . Years of education: Not  on file  . Highest education level: Not on file  Occupational History  . Not on file  Tobacco Use  . Smoking status: Current Every Day Smoker    Packs/day: 0.50    Years: 30.00    Pack years: 15.00    Types: Cigarettes  . Smokeless tobacco: Never Used  Substance and Sexual Activity  . Alcohol use: Yes    Comment: occ  . Drug use: No  . Sexual activity: Yes    Birth control/protection: None  Other Topics Concern  . Not on file  Social History Narrative  . Not on file   Social Determinants of Health   Financial Resource Strain:   . Difficulty of Paying Living Expenses:   Food Insecurity:   . Worried About Charity fundraiser in the Last Year:   . Arboriculturist in the Last Year:   Transportation Needs:   . Film/video editor (Medical):   Marland Kitchen Lack of Transportation (Non-Medical):   Physical Activity:   . Days of Exercise per Week:   . Minutes of Exercise per Session:   Stress:   . Feeling of Stress :   Social Connections:   . Frequency of Communication with Friends and Family:   . Frequency of Social Gatherings with Friends and Family:   . Attends Religious Services:   . Active Member of Clubs or Organizations:   . Attends Archivist Meetings:   Marland Kitchen Marital Status:   Intimate Partner Violence:   . Fear of Current or Ex-Partner:   . Emotionally Abused:   Marland Kitchen Physically Abused:   . Sexually Abused:         Objective:    BP 123/75   Pulse 74   Temp (!) 97 F (36.1 C) (Temporal)   Ht 6\' 1"  (1.854 m)   Wt 205 lb (93 kg)   SpO2 95%   BMI 27.05 kg/m   Wt Readings from Last 3 Encounters:  02/21/20 205 lb (93 kg)  09/20/19 202 lb (91.6 kg)  08/23/19 197 lb 12.8 oz (89.7 kg)    Physical Exam Vitals reviewed.  Constitutional:      General: He is not in acute distress.    Appearance: Normal appearance. He is overweight. He is not ill-appearing, toxic-appearing or diaphoretic.  HENT:     Head: Normocephalic and atraumatic.  Eyes:     General: No  scleral icterus.       Right eye: No discharge.        Left eye: No discharge.     Conjunctiva/sclera: Conjunctivae normal.  Cardiovascular:     Rate and Rhythm: Normal rate and regular rhythm.     Heart sounds: Normal heart sounds. No murmur. No friction  rub. No gallop.   Pulmonary:     Effort: Pulmonary effort is normal. No respiratory distress.     Breath sounds: Normal breath sounds. No stridor. No wheezing, rhonchi or rales.  Musculoskeletal:        General: Normal range of motion.     Cervical back: Normal range of motion.  Skin:    General: Skin is warm and dry.  Neurological:     Mental Status: He is alert and oriented to person, place, and time. Mental status is at baseline.  Psychiatric:        Mood and Affect: Mood normal.        Behavior: Behavior normal.        Thought Content: Thought content normal.        Judgment: Judgment normal.     Lab Results  Component Value Date   TSH 2.980 10/24/2017   Lab Results  Component Value Date   WBC 8.3 04/19/2019   HGB 18.4 (H) 04/19/2019   HCT 52.8 (H) 04/19/2019   MCV 94 04/19/2019   PLT 270 04/19/2019   Lab Results  Component Value Date   NA 139 04/19/2019   K 4.3 04/19/2019   CO2 21 04/19/2019   GLUCOSE 130 (H) 04/19/2019   BUN 9 04/19/2019   CREATININE 1.11 04/19/2019   BILITOT 0.3 04/19/2019   ALKPHOS 54 04/19/2019   AST 26 04/19/2019   ALT 29 04/19/2019   PROT 6.9 04/19/2019   ALBUMIN 4.5 04/19/2019   CALCIUM 9.2 04/19/2019   ANIONGAP 8 09/30/2015   Lab Results  Component Value Date   CHOL 119 04/19/2019   Lab Results  Component Value Date   HDL 28 (L) 04/19/2019   Lab Results  Component Value Date   LDLCALC 47 04/19/2019   Lab Results  Component Value Date   TRIG 220 (H) 04/19/2019   Lab Results  Component Value Date   CHOLHDL 4.3 04/19/2019   No results found for: HGBA1C

## 2020-02-23 ENCOUNTER — Encounter: Payer: Self-pay | Admitting: Family Medicine

## 2020-03-05 ENCOUNTER — Ambulatory Visit (INDEPENDENT_AMBULATORY_CARE_PROVIDER_SITE_OTHER): Payer: Commercial Managed Care - PPO | Admitting: Family Medicine

## 2020-03-05 ENCOUNTER — Encounter: Payer: Self-pay | Admitting: Family Medicine

## 2020-03-05 DIAGNOSIS — F5101 Primary insomnia: Secondary | ICD-10-CM

## 2020-03-05 MED ORDER — TEMAZEPAM 22.5 MG PO CAPS: 22.5000 mg | ORAL_CAPSULE | Freq: Every evening | ORAL | 0 refills | Status: DC | PRN

## 2020-03-05 NOTE — Progress Notes (Signed)
Virtual Visit via Telephone Note  I connected with Devin Ryan on 03/05/20 at 8:14 AM by telephone and verified that I am speaking with the correct person using two identifiers. PERVIS SCHUENEMAN is currently located at work and nobody is currently with him during this visit. The provider, [name], FNP is located in their home at time of visit.  I discussed the limitations, risks, security and privacy concerns of performing an evaluation and management service by telephone and the availability of in person appointments. I also discussed with the patient that there may be a patient responsible charge related to this service. The patient expressed understanding and agreed to proceed.  Subjective: PCP: [name], FNP  Chief Complaint  Patient presents with  . Insomnia   Patient is following up on insomnia.  He was started on temazepam 15 mg at bedtime 2 weeks ago as he was previously taking Lunesta 3 mg and Xanax 1 mg at bedtime which was not effective for his sleep.  Patient reports he is now only waking up 1-2 times a night and likes this medication.  He also reports he is not as groggy in the morning as he used to be, which is great.  He does wonder if a higher dose would help him sleep better.   ROS: Per HPI  Current Outpatient Medications:  .  ALPRAZolam (XANAX) 1 MG tablet, Take 1 tablet (1 mg total) by mouth daily as needed for anxiety., Disp: 30 tablet, Rfl: 1 .  busPIRone (BUSPAR) 15 MG tablet, Take 1 tablet (15 mg total) by mouth 3 (three) times daily., Disp: 270 tablet, Rfl: 1 .  fenofibrate micronized (LOFIBRA) 134 MG capsule, Take 1 capsule (134 mg total) by mouth daily before breakfast., Disp: 90 capsule, Rfl: 1 .  ibuprofen (ADVIL,MOTRIN) 200 MG tablet, Take 800 mg by mouth every 6 (six) hours as needed for moderate pain., Disp: , Rfl:  .  PROAIR HFA 108 (90 Base) MCG/ACT inhaler, Inhale 1 puff into the lungs every 6 (six) hours as needed. Shortness of breath,  Disp: 18 g, Rfl: 1 .  temazepam (RESTORIL) 15 MG capsule, Take 1 capsule (15 mg total) by mouth at bedtime as needed for sleep., Disp: 14 capsule, Rfl: 0  No Known Allergies Past Medical History:  Diagnosis Date  . Anxiety   . Diverticulitis   . Eczema 11/30/2018  . Headache 11/30/2018    Observations/Objective: A&O  No respiratory distress or wheezing audible over the phone Mood, judgement, and thought processes all WNL   Assessment and Plan: 1. Primary insomnia - Improving.  Temazepam dosage increased from 15 mg to 22.5 mg at bedtime. - temazepam (RESTORIL) 22.5 MG capsule; Take 1 capsule (22.5 mg total) by mouth at bedtime as needed for sleep.  Dispense: 14 capsule; Refill: 0   Follow Up Instructions: Return in about 12 days (around 03/17/2020) for f/u sleep (telephone).  I discussed the assessment and treatment plan with the patient. The patient was provided an opportunity to ask questions and all were answered. The patient agreed with the plan and demonstrated an understanding of the instructions.   The patient was advised to call back or seek an in-person evaluation if the symptoms worsen or if the condition fails to improve as anticipated.  The above assessment and management plan was discussed with the patient. The patient verbalized understanding of and has agreed to the management plan. Patient is aware to call the clinic if symptoms persist  or worsen. Patient is aware when to return to the clinic for a follow-up visit. Patient educated on when it is appropriate to go to the emergency department.   Time call ended: 8:21 AM  I provided 9 minutes of non-face-to-face time during this encounter.  Hendricks Limes, MSN, APRN, FNP-C [facility] 03/05/20

## 2020-03-06 ENCOUNTER — Encounter: Payer: Self-pay | Admitting: Family Medicine

## 2020-03-06 DIAGNOSIS — F5101 Primary insomnia: Secondary | ICD-10-CM

## 2020-03-06 MED ORDER — TEMAZEPAM 30 MG PO CAPS: 30.0000 mg | ORAL_CAPSULE | Freq: Every evening | ORAL | 0 refills | Status: DC | PRN

## 2020-03-17 ENCOUNTER — Encounter: Payer: Self-pay | Admitting: Family Medicine

## 2020-03-17 ENCOUNTER — Ambulatory Visit (INDEPENDENT_AMBULATORY_CARE_PROVIDER_SITE_OTHER): Payer: Commercial Managed Care - PPO | Admitting: Family Medicine

## 2020-03-17 DIAGNOSIS — F419 Anxiety disorder, unspecified: Secondary | ICD-10-CM | POA: Diagnosis not present

## 2020-03-17 DIAGNOSIS — F5101 Primary insomnia: Secondary | ICD-10-CM

## 2020-03-17 MED ORDER — TEMAZEPAM 30 MG PO CAPS: 30.0000 mg | ORAL_CAPSULE | Freq: Every evening | ORAL | 2 refills | Status: DC | PRN

## 2020-03-17 NOTE — Progress Notes (Signed)
Virtual Visit via Telephone Note  I connected with Devin Ryan on 03/17/20 at 8:50 AM by telephone and verified that I am speaking with the correct person using two identifiers. Devin Ryan is currently located at work and nobody is currently with him during this visit. The provider, [name], FNP is located in their home at time of visit.  I discussed the limitations, risks, security and privacy concerns of performing an evaluation and management service by telephone and the availability of in person appointments. I also discussed with the patient that there may be a patient responsible charge related to this service. The patient expressed understanding and agreed to proceed.  Subjective: PCP: [name], FNP  Chief Complaint  Patient presents with  . Insomnia   Patient is following up on insomnia.  His dosage of temazepam was increased from 15 mg to 30 mg 2 weeks ago.  He reports he is sleeping good and not having the sluggish feeling the next day that he had previously.  He does report he has not needed the medication as much in the past couple of days because he has been moving and has been so exhausted at night.  Patient is still requiring Xanax 1 mg once daily.  He feels once he gets out of his current living/relationship situation he will no longer need the Xanax.  He reports he was with the same person years ago and required Xanax, but once he left he no longer needed it.  He feels certain the same will happen this time.   ROS: Per HPI  Current Outpatient Medications:  .  ALPRAZolam (XANAX) 1 MG tablet, Take 1 tablet (1 mg total) by mouth daily as needed for anxiety., Disp: 30 tablet, Rfl: 1 .  busPIRone (BUSPAR) 15 MG tablet, Take 1 tablet (15 mg total) by mouth 3 (three) times daily., Disp: 270 tablet, Rfl: 1 .  fenofibrate micronized (LOFIBRA) 134 MG capsule, Take 1 capsule (134 mg total) by mouth daily before breakfast., Disp: 90 capsule, Rfl: 1 .  ibuprofen  (ADVIL,MOTRIN) 200 MG tablet, Take 800 mg by mouth every 6 (six) hours as needed for moderate pain., Disp: , Rfl:  .  PROAIR HFA 108 (90 Base) MCG/ACT inhaler, Inhale 1 puff into the lungs every 6 (six) hours as needed. Shortness of breath, Disp: 18 g, Rfl: 1 .  temazepam (RESTORIL) 30 MG capsule, Take 1 capsule (30 mg total) by mouth at bedtime as needed for sleep., Disp: 14 capsule, Rfl: 0  No Known Allergies Past Medical History:  Diagnosis Date  . Anxiety   . Diverticulitis   . Eczema 11/30/2018  . Headache 11/30/2018    Observations/Objective: A&O  No respiratory distress or wheezing audible over the phone Mood, judgement, and thought processes all WNL  Assessment and Plan: 1. Primary insomnia - Well controlled on current regimen.  - temazepam (RESTORIL) 30 MG capsule; Take 1 capsule (30 mg total) by mouth at bedtime as needed for sleep.  Dispense: 30 capsule; Refill: 2  2. Anxiety - Continue Xanax for now due to to patient's current situation.  He feels certain he will be able to stop it once he is out of his current situation.   Follow Up Instructions: Return in about 3 months (around 06/17/2020) for anxiety.  I discussed the assessment and treatment plan with the patient. The patient was provided an opportunity to ask questions and all were answered. The patient agreed with the plan and  demonstrated an understanding of the instructions.   The patient was advised to call back or seek an in-person evaluation if the symptoms worsen or if the condition fails to improve as anticipated.  The above assessment and management plan was discussed with the patient. The patient verbalized understanding of and has agreed to the management plan. Patient is aware to call the clinic if symptoms persist or worsen. Patient is aware when to return to the clinic for a follow-up visit. Patient educated on when it is appropriate to go to the emergency department.   Time call ended: 9:00 AM  I  provided 12 minutes of non-face-to-face time during this encounter.  Hendricks Limes, MSN, APRN, FNP-C [facility] 03/17/20

## 2020-04-23 ENCOUNTER — Encounter: Payer: Self-pay | Admitting: Family Medicine

## 2020-04-23 NOTE — Telephone Encounter (Signed)
Appt made for 7/16

## 2020-04-24 ENCOUNTER — Encounter: Payer: Self-pay | Admitting: Family

## 2020-04-24 ENCOUNTER — Ambulatory Visit (INDEPENDENT_AMBULATORY_CARE_PROVIDER_SITE_OTHER): Payer: Commercial Managed Care - PPO | Admitting: Family

## 2020-04-24 ENCOUNTER — Other Ambulatory Visit: Payer: Self-pay

## 2020-04-24 VITALS — BP 116/68 | HR 68 | Temp 98.6°F | Ht 73.0 in | Wt 191.8 lb

## 2020-04-24 DIAGNOSIS — H938X1 Other specified disorders of right ear: Secondary | ICD-10-CM

## 2020-04-24 DIAGNOSIS — Z1211 Encounter for screening for malignant neoplasm of colon: Secondary | ICD-10-CM | POA: Diagnosis not present

## 2020-04-24 DIAGNOSIS — H01001 Unspecified blepharitis right upper eyelid: Secondary | ICD-10-CM

## 2020-04-24 MED ORDER — BACITRACIN-POLYMYX-NEO-HC 1 % OP OINT: TOPICAL_OINTMENT | OPHTHALMIC | 0 refills | Status: DC

## 2020-04-24 MED ORDER — CETIRIZINE HCL 10 MG PO TABS: 10.0000 mg | ORAL_TABLET | Freq: Every day | ORAL | 11 refills | Status: AC

## 2020-04-24 NOTE — Patient Instructions (Signed)
Blepharitis °Blepharitis is inflammation of the eyelids. Blepharitis may happen with: °· Reddish, scaly skin around the scalp and eyebrows. °· Burning or itching of the eyelids. °· Eye discharge at night that causes the eyelashes to stick together in the morning. °· Eyelashes that fall out. °· Sensitivity to light. °Follow these instructions at home: °Pay attention to any changes in how your eyes look or feel. Tell your health care provider about any changes. Follow these instructions to help with your condition. °Keeping Clean ° °· Wash your hands often. °· Wash your eyelids with warm water or with warm water that is mixed with a small amount of baby shampoo. Do this two times per day or as often as needed. °· Wash your face and eyebrows at least once a day. °· Use a clean towel each time you dry your eyelids. Do not use this towel to clean or dry other areas of your body. Do not share your towel with anyone. °General instructions °· Avoid wearing makeup until you get better. Do not share makeup with anyone. °· Avoid rubbing your eyes. °· Apply warm compresses to your eyes 2 times per day for 10 minutes at a time, or as told by your health care provider. °· If you were prescribed an antibiotic ointment or steroid drops, apply or use the medicine as told by your health care provider. Do not stop using the medicine even if you feel better. °· Keep all follow-up visits as told by your health care provider. This is important. °Contact a health care provider if: °· Your eyelids feel hot. °· You have blisters or a rash on your eyelids. °· The condition does not go away in 2-4 days. °· The inflammation gets worse. °Get help right away if: °· You have pain or redness that gets worse or spreads to other parts of your face. °· Your vision changes. °· You have pain when looking at lights or moving objects. °· You have a fever. °Summary °· Blepharitis is inflammation of the eyelids. °· Pay attention to any changes in how your  eyes look or feel. Tell your health care provider about any changes. °· Follow home care instructions as told by your health care provider. Wash your hands often. Avoid wearing makeup. Do not rub your eyes. °· To treat this condition, use warm compresses and prescription ointments or eye drops. °· Let your health care provider know if you have vision changes, blisters or rash on eyelids, pain that spreads to your face, or warmth on your eyelids. °This information is not intended to replace advice given to you by your health care provider. Make sure you discuss any questions you have with your health care provider. °Document Revised: 03/19/2018 Document Reviewed: 03/19/2018 °Elsevier Patient Education © 2020 [employer] ° °

## 2020-04-24 NOTE — Progress Notes (Signed)
Subjective:    Patient ID: Devin Ryan, male    DOB: 20-Jan-1966, 54 y.o.   MRN: [id number]  Chief Complaint  Patient presents with  . Eye Problem    scratched eye while mowing   . Ear Problem    RIGHT EAR POPPING    PT presents to the office today with right upper eye lid pain. He reports he scratched his eye lid while mowing on the 04/18/20. He reports he scratched it with his fingernails. He reports that area was tender at first, but two days ago he noticed swelling and increased tender. He states he noticed last night a clear discharge and now the tenderness has improved. Still has mild swelling.  Ear Fullness  There is pain in the right ear. This is a new problem. The current episode started in the past 7 days. The problem occurs constantly. The problem has been unchanged. There has been no fever. The patient is experiencing no pain. Associated symptoms include hearing loss. Pertinent negatives include no coughing, ear discharge, rhinorrhea or sore throat. He has tried nothing for the symptoms. The treatment provided no relief.      Review of Systems  HENT: Positive for hearing loss. Negative for ear discharge, rhinorrhea and sore throat.   Respiratory: Negative for cough.   All other systems reviewed and are negative.      Objective:   Physical Exam Vitals reviewed.  Constitutional:      General: He is not in acute distress.    Appearance: He is well-developed.  HENT:     Head: Normocephalic.     Right Ear: Tympanic membrane normal.     Left Ear: Tympanic membrane is bulging.  Eyes:     General:        Right eye: No discharge.        Left eye: No discharge.     Pupils: Pupils are equal, round, and reactive to light.      Comments: Right upper lid swelling, erythemas  Neck:     Thyroid: No thyromegaly.  Cardiovascular:     Rate and Rhythm: Normal rate and regular rhythm.     Heart sounds: Normal heart sounds. No murmur heard.   Pulmonary:     Effort:  Pulmonary effort is normal. No respiratory distress.     Breath sounds: Normal breath sounds. No wheezing.  Abdominal:     General: Bowel sounds are normal. There is no distension.     Palpations: Abdomen is soft.     Tenderness: There is no abdominal tenderness.  Musculoskeletal:        General: No tenderness. Normal range of motion.     Cervical back: Normal range of motion and neck supple.  Skin:    General: Skin is warm and dry.     Findings: No erythema or rash.  Neurological:     Mental Status: He is alert and oriented to person, place, and time.     Cranial Nerves: No cranial nerve deficit.     Deep Tendon Reflexes: Reflexes are normal and symmetric.  Psychiatric:        Behavior: Behavior normal.        Thought Content: Thought content normal.        Judgment: Judgment normal.          BP 116/68   Pulse 68   Temp 98.6 F (37 C) (Temporal)   Ht 6\' 1"  (1.854 m)   Wt 191 lb 12.8  oz (87 kg)   BMI 25.30 kg/m   Assessment & Plan:  Devin Ryan comes in today with chief complaint of Eye Problem (scratched eye while mowing ) and Ear Problem (RIGHT EAR POPPING )   Diagnosis and orders addressed:  1. Blepharitis of right upper eyelid, unspecified type -Do not rub Keep clean and dry - baci-polymyx-neo-hydrocort (CORTISPORIN) 1 % ointment; Place into the right eye every 4 (four) hours.  Dispense: 3.5 g; Refill: 0  2. Sensation of fullness in right ear Start daily zyrtec - cetirizine (ZYRTEC) 10 MG tablet; Take 1 tablet (10 mg total) by mouth daily.  Dispense: 30 tablet; Refill: 11  3. Colon cancer screening - Cologuard   Follow up plan: IF symptoms worsen or do not improve    Devin Dun, FNP

## 2020-05-25 ENCOUNTER — Other Ambulatory Visit: Payer: Self-pay | Admitting: Family Medicine

## 2020-05-25 DIAGNOSIS — F419 Anxiety disorder, unspecified: Secondary | ICD-10-CM

## 2020-06-11 ENCOUNTER — Encounter: Payer: Self-pay | Admitting: Family Medicine

## 2020-06-16 ENCOUNTER — Ambulatory Visit (INDEPENDENT_AMBULATORY_CARE_PROVIDER_SITE_OTHER): Payer: Commercial Managed Care - PPO | Admitting: Family Medicine

## 2020-06-16 ENCOUNTER — Encounter: Payer: Self-pay | Admitting: Family Medicine

## 2020-06-16 DIAGNOSIS — R519 Headache, unspecified: Secondary | ICD-10-CM | POA: Diagnosis not present

## 2020-06-16 MED ORDER — AZITHROMYCIN 250 MG PO TABS: ORAL_TABLET | ORAL | 0 refills | Status: DC

## 2020-06-16 NOTE — Progress Notes (Signed)
Subjective:    Patient ID: Devin Ryan, male    DOB: 1965-12-17, 54 y.o.   MRN: [id number]   HPI: Devin Ryan is a 54 y.o. male presenting for one week of chills body aches. Awoke 4 days ago with fever to 103.7. Goes down to 99 and then cycles back. Had home CoVID test X 2 that were negative. Worst HA ever for 2 days. Has not had CoVID vaccine. Wants antibiotic for fever and HA. Unwilling to get further CoVID testing.   Depression screen [facility] 2/9 02/21/2020 09/20/2019 08/23/2019 04/17/2019 09/18/2018  Decreased Interest 0 0 0 0 0  Down, Depressed, Hopeless 0 0 0 0 0  PHQ - 2 Score 0 0 0 0 0  Altered sleeping 0 - 1 - -  Tired, decreased energy 1 - 2 - -  Change in appetite 0 - 0 - -  Feeling bad or failure about yourself  0 - 0 - -  Trouble concentrating 0 - 0 - -  Moving slowly or fidgety/restless 0 - 0 - -  Suicidal thoughts 0 - 0 - -  PHQ-9 Score 1 - 3 - -  Difficult doing work/chores - - Somewhat difficult - -     Relevant past medical, surgical, family and social history reviewed and updated as indicated.  Interim medical history since our last visit reviewed. Allergies and medications reviewed and updated.  ROS:  Review of Systems  Constitutional: Positive for diaphoresis and fever.  HENT: Negative for congestion, ear pain, postnasal drip, rhinorrhea and sore throat.   Respiratory: Negative for cough, shortness of breath and wheezing.   Neurological: Positive for headaches.     Social History   Tobacco Use  Smoking Status Current Every Day Smoker  . Packs/day: 0.50  . Years: 30.00  . Pack years: 15.00  . Types: Cigarettes  Smokeless Tobacco Never Used       Objective:     Wt Readings from Last 3 Encounters:  04/24/20 191 lb 12.8 oz (87 kg)  02/21/20 205 lb (93 kg)  09/20/19 202 lb (91.6 kg)     Exam deferred. Pt. Harboring due to COVID 19. Phone visit performed.   Assessment & Plan:   1. Acute intractable headache, unspecified headache type      Meds ordered this encounter  Medications  . azithromycin (ZITHROMAX Z-PAK) 250 MG tablet    Sig: Take two right away Then one a day for the next 4 days.    Dispense:  6 each    Refill:  0    No orders of the defined types were placed in this encounter.     Diagnoses and all orders for this visit:  Acute intractable headache, unspecified headache type  Other orders -     azithromycin (ZITHROMAX Z-PAK) 250 MG tablet; Take two right away Then one a day for the next 4 days.    Virtual Visit via telephone Note  I discussed the limitations, risks, security and privacy concerns of performing an evaluation and management service by telephone and the availability of in person appointments. The patient was identified with two identifiers. Pt.expressed understanding and agreed to proceed. Pt. Is at home. Dr. Livia Snellen is in his office.  Follow Up Instructions:   I discussed the assessment and treatment plan with the patient. The patient was provided an opportunity to ask questions and all were answered. The patient agreed with the plan and demonstrated an understanding of the instructions.  The patient was advised to call back or seek an in-person evaluation if the symptoms worsen or if the condition fails to improve as anticipated.   Total minutes including chart review and phone contact time: 14   Follow up plan: Return if symptoms worsen or fail to improve.  Claretta Fraise, MD [place]

## 2020-06-16 NOTE — Telephone Encounter (Signed)
Pt is scheduled in after hours today at 4:45pm

## 2020-06-17 ENCOUNTER — Ambulatory Visit: Payer: Commercial Managed Care - PPO | Admitting: Family Medicine

## 2020-07-08 ENCOUNTER — Encounter: Payer: Self-pay | Admitting: Family Medicine

## 2020-07-08 DIAGNOSIS — E781 Pure hyperglyceridemia: Secondary | ICD-10-CM

## 2020-07-09 MED ORDER — FENOFIBRATE 134 MG PO CAPS: 134.0000 mg | ORAL_CAPSULE | Freq: Every day | ORAL | 0 refills | Status: DC

## 2020-07-21 ENCOUNTER — Ambulatory Visit (INDEPENDENT_AMBULATORY_CARE_PROVIDER_SITE_OTHER): Payer: Commercial Managed Care - PPO | Admitting: Family Medicine

## 2020-07-21 ENCOUNTER — Encounter: Payer: Self-pay | Admitting: Family Medicine

## 2020-07-21 DIAGNOSIS — F5101 Primary insomnia: Secondary | ICD-10-CM | POA: Diagnosis not present

## 2020-07-21 DIAGNOSIS — Z720 Tobacco use: Secondary | ICD-10-CM | POA: Diagnosis not present

## 2020-07-21 DIAGNOSIS — E781 Pure hyperglyceridemia: Secondary | ICD-10-CM

## 2020-07-21 MED ORDER — TEMAZEPAM 30 MG PO CAPS: 30.0000 mg | ORAL_CAPSULE | Freq: Every day | ORAL | 5 refills | Status: DC

## 2020-07-21 NOTE — Progress Notes (Signed)
Virtual Visit via Telephone Note  I connected with Devin Ryan on 07/21/20 at 11:08 AM by telephone and verified that I am speaking with the correct person using two identifiers. Devin Ryan is currently located at work and nobody is currently with him during this visit. The provider, [name], FNP is located in their home at time of visit.  I discussed the limitations, risks, security and privacy concerns of performing an evaluation and management service by telephone and the availability of in person appointments. I also discussed with the patient that there may be a patient responsible charge related to this service. The patient expressed understanding and agreed to proceed.  Subjective: PCP: [name], FNP  Chief Complaint  Patient presents with  . Hyperlipidemia  . Insomnia   Patient is is in need of fasting lab work.   He is also in need of a refill of Temazepam which he was sleeping well with before he ran out. He was taking it nightly.    ROS: Per HPI  Current Outpatient Medications:  .  ALPRAZolam (XANAX) 1 MG tablet, Take 1 tablet (1 mg total) by mouth daily as needed for anxiety., Disp: 30 tablet, Rfl: 1 .  azithromycin (ZITHROMAX Z-PAK) 250 MG tablet, Take two right away Then one a day for the next 4 days., Disp: 6 each, Rfl: 0 .  baci-polymyx-neo-hydrocort (CORTISPORIN) 1 % ointment, Place into the right eye every 4 (four) hours., Disp: 3.5 g, Rfl: 0 .  busPIRone (BUSPAR) 15 MG tablet, Take 1 tablet (15 mg total) by mouth 3 (three) times daily., Disp: 270 tablet, Rfl: 1 .  cetirizine (ZYRTEC) 10 MG tablet, Take 1 tablet (10 mg total) by mouth daily., Disp: 30 tablet, Rfl: 11 .  fenofibrate micronized (LOFIBRA) 134 MG capsule, Take 1 capsule (134 mg total) by mouth daily before breakfast., Disp: 30 capsule, Rfl: 0 .  ibuprofen (ADVIL,MOTRIN) 200 MG tablet, Take 800 mg by mouth every 6 (six) hours as needed for moderate pain., Disp: , Rfl:  .  PROAIR  HFA 108 (90 Base) MCG/ACT inhaler, Inhale 1 puff into the lungs every 6 (six) hours as needed. Shortness of breath, Disp: 18 g, Rfl: 1 .  temazepam (RESTORIL) 30 MG capsule, Take 1 capsule (30 mg total) by mouth at bedtime as needed for sleep., Disp: 30 capsule, Rfl: 2  No Known Allergies Past Medical History:  Diagnosis Date  . Anxiety   . Diverticulitis   . Eczema 11/30/2018  . Headache 11/30/2018    Observations/Objective: A&O  No respiratory distress or wheezing audible over the phone Mood, judgement, and thought processes all WNL   Assessment and Plan: 1. Hypertriglyceridemia - Labs to assess for control. - CBC with Differential/Platelet; Future - CMP14+EGFR; Future - Lipid panel; Future  2. Primary insomnia - Well controlled on current regimen.  - temazepam (RESTORIL) 30 MG capsule; Take 1 capsule (30 mg total) by mouth at bedtime.  Dispense: 30 capsule; Refill: 5 - CMP14+EGFR; Future  3. Tobacco abuse - CBC with Differential/Platelet; Future - CMP14+EGFR; Future - Lipid panel; Future   Follow Up Instructions: Return in about 6 months (around 01/19/2021) for follow-up of chronic medication conditions.  I discussed the assessment and treatment plan with the patient. The patient was provided an opportunity to ask questions and all were answered. The patient agreed with the plan and demonstrated an understanding of the instructions.   The patient was advised to call back or seek an  in-person evaluation if the symptoms worsen or if the condition fails to improve as anticipated.  The above assessment and management plan was discussed with the patient. The patient verbalized understanding of and has agreed to the management plan. Patient is aware to call the clinic if symptoms persist or worsen. Patient is aware when to return to the clinic for a follow-up visit. Patient educated on when it is appropriate to go to the emergency department.   Time call ended: 11:17 AM  I  provided 11 minutes of non-face-to-face time during this encounter.  Hendricks Limes, MSN, APRN, FNP-C [facility] 07/21/20

## 2020-07-31 ENCOUNTER — Other Ambulatory Visit: Payer: Self-pay

## 2020-07-31 ENCOUNTER — Other Ambulatory Visit: Payer: Commercial Managed Care - PPO

## 2020-07-31 DIAGNOSIS — E781 Pure hyperglyceridemia: Secondary | ICD-10-CM

## 2020-07-31 DIAGNOSIS — F5101 Primary insomnia: Secondary | ICD-10-CM

## 2020-07-31 DIAGNOSIS — Z720 Tobacco use: Secondary | ICD-10-CM

## 2020-08-01 LAB — CMP14+EGFR
ALT: 23 IU/L (ref 0–44)
AST: 22 IU/L (ref 0–40)
Albumin/Globulin Ratio: 1.7 (ref 1.2–2.2)
Albumin: 4.5 g/dL (ref 3.8–4.9)
Alkaline Phosphatase: 47 IU/L (ref 44–121)
BUN/Creatinine Ratio: 14 (ref 9–20)
BUN: 13 mg/dL (ref 6–24)
Bilirubin Total: 0.5 mg/dL (ref 0.0–1.2)
CO2: 26 mmol/L (ref 20–29)
Calcium: 9.1 mg/dL (ref 8.7–10.2)
Chloride: 101 mmol/L (ref 96–106)
Creatinine, Ser: 0.95 mg/dL (ref 0.76–1.27)
GFR calc Af Amer: 106 mL/min/{1.73_m2} (ref >59)
GFR calc non Af Amer: 92 mL/min/{1.73_m2} (ref >59)
Globulin, Total: 2.6 g/dL (ref 1.5–4.5)
Glucose: 159 mg/dL — ABNORMAL HIGH (ref 65–99)
Potassium: 4 mmol/L (ref 3.5–5.2)
Sodium: 140 mmol/L (ref 134–144)
Total Protein: 7.1 g/dL (ref 6.0–8.5)

## 2020-08-01 LAB — CBC WITH DIFFERENTIAL/PLATELET
Basophils Absolute: 0.1 10*3/uL (ref 0.0–0.2)
Basos: 1 %
EOS (ABSOLUTE): 0.6 10*3/uL — ABNORMAL HIGH (ref 0.0–0.4)
Eos: 7 %
Hematocrit: 41.8 % (ref 37.5–51.0)
Hemoglobin: 14.7 g/dL (ref 13.0–17.7)
Immature Grans (Abs): 0 10*3/uL (ref 0.0–0.1)
Immature Granulocytes: 0 %
Lymphocytes Absolute: 3 10*3/uL (ref 0.7–3.1)
Lymphs: 34 %
MCH: 32.9 pg (ref 26.6–33.0)
MCHC: 35.2 g/dL (ref 31.5–35.7)
MCV: 94 fL (ref 79–97)
Monocytes Absolute: 0.8 10*3/uL (ref 0.1–0.9)
Monocytes: 9 %
Neutrophils Absolute: 4.3 10*3/uL (ref 1.4–7.0)
Neutrophils: 49 %
Platelets: 262 10*3/uL (ref 150–450)
RBC: 4.47 x10E6/uL (ref 4.14–5.80)
RDW: 12.3 % (ref 11.6–15.4)
WBC: 8.8 10*3/uL (ref 3.4–10.8)

## 2020-08-01 LAB — LIPID PANEL
Chol/HDL Ratio: 3.9 ratio (ref 0.0–5.0)
Cholesterol, Total: 137 mg/dL (ref 100–199)
HDL: 35 mg/dL — ABNORMAL LOW (ref >39)
LDL Chol Calc (NIH): 81 mg/dL (ref 0–99)
Triglycerides: 117 mg/dL (ref 0–149)
VLDL Cholesterol Cal: 21 mg/dL (ref 5–40)

## 2020-10-28 ENCOUNTER — Telehealth: Payer: Self-pay

## 2020-10-28 NOTE — Telephone Encounter (Signed)
Patient aware of lab results.

## 2020-11-03 ENCOUNTER — Other Ambulatory Visit: Payer: Self-pay | Admitting: Family Medicine

## 2020-11-03 DIAGNOSIS — E781 Pure hyperglyceridemia: Secondary | ICD-10-CM

## 2021-01-04 ENCOUNTER — Encounter: Payer: Self-pay | Admitting: Family Medicine

## 2021-01-04 ENCOUNTER — Ambulatory Visit (INDEPENDENT_AMBULATORY_CARE_PROVIDER_SITE_OTHER): Payer: Commercial Managed Care - PPO | Admitting: Family Medicine

## 2021-01-04 DIAGNOSIS — J329 Chronic sinusitis, unspecified: Secondary | ICD-10-CM | POA: Diagnosis not present

## 2021-01-04 DIAGNOSIS — J40 Bronchitis, not specified as acute or chronic: Secondary | ICD-10-CM

## 2021-01-04 MED ORDER — AZITHROMYCIN 250 MG PO TABS: ORAL_TABLET | ORAL | 0 refills | Status: DC

## 2021-01-04 NOTE — Progress Notes (Signed)
Subjective:    Patient ID: Devin Ryan, male    DOB: 25-Sep-1966, 55 y.o.   MRN: [id number]   HPI: Devin Ryan is a 55 y.o. male presenting for onset night before last. Heavy congestion in left side of lungs. Fever to 100.7. Today it is 101.3 Feels phlegm popping in the left lung. Decreased appetite. Feeling aches and pains with HA. HA over the whole head.  Not eating much.    Depression screen [facility] 2/9 02/21/2020 09/20/2019 08/23/2019 04/17/2019 09/18/2018  Decreased Interest 0 0 0 0 0  Down, Depressed, Hopeless 0 0 0 0 0  PHQ - 2 Score 0 0 0 0 0  Altered sleeping 0 - 1 - -  Tired, decreased energy 1 - 2 - -  Change in appetite 0 - 0 - -  Feeling bad or failure about yourself  0 - 0 - -  Trouble concentrating 0 - 0 - -  Moving slowly or fidgety/restless 0 - 0 - -  Suicidal thoughts 0 - 0 - -  PHQ-9 Score 1 - 3 - -  Difficult doing work/chores - - Somewhat difficult - -     Relevant past medical, surgical, family and social history reviewed and updated as indicated.  Interim medical history since our last visit reviewed. Allergies and medications reviewed and updated.  ROS:  Review of Systems  Constitutional: Positive for chills, diaphoresis, fatigue and fever.  HENT: Positive for congestion.   Respiratory: Positive for cough and chest tightness. Negative for shortness of breath.   Cardiovascular: Positive for palpitations. Negative for chest pain.  Skin: Negative for rash.  Neurological: Negative.   Psychiatric/Behavioral: Negative.      Social History   Tobacco Use  Smoking Status Current Every Day Smoker  . Packs/day: 0.50  . Years: 30.00  . Pack years: 15.00  . Types: Cigarettes  Smokeless Tobacco Never Used       Objective:     Wt Readings from Last 3 Encounters:  04/24/20 191 lb 12.8 oz (87 kg)  02/21/20 205 lb (93 kg)  09/20/19 202 lb (91.6 kg)     Exam deferred. Pt. Harboring due to COVID 19. Phone visit performed.   Assessment & Plan:    1. Sinobronchitis     Meds ordered this encounter  Medications  . azithromycin (ZITHROMAX Z-PAK) 250 MG tablet    Sig: Take two right away Then one a day for the next 4 days.    Dispense:  6 each    Refill:  0    Orders Placed This Encounter  Procedures  . Novel Coronavirus, NAA ([facility])    Order Specific Question:   Is this test for diagnosis or screening    Answer:   Diagnosis of ill patient    Order Specific Question:   Symptomatic for COVID-19 as defined by CDC    Answer:   Yes    Order Specific Question:   Date of Symptom Onset    Answer:   01/01/2021    Order Specific Question:   Hospitalized for COVID-19    Answer:   No    Order Specific Question:   Admitted to ICU for COVID-19    Answer:   No    Order Specific Question:   Previously tested for COVID-19    Answer:   No    Order Specific Question:   Resident in a congregate (group) care setting    Answer:   No  Order Specific Question:   Is the patient student?    Answer:   No    Order Specific Question:   Employed in healthcare setting    Answer:   No    Order Specific Question:   Has patient completed COVID vaccination(s) (2 doses of Pfizer/Moderna 1 dose of [employer])    Answer:   Unknown    Order Specific Question:   Release to patient    Answer:   Immediate      Diagnoses and all orders for this visit:  Sinobronchitis -     Novel Coronavirus, NAA ([facility])  Other orders -     azithromycin (ZITHROMAX Z-PAK) 250 MG tablet; Take two right away Then one a day for the next 4 days.    Virtual Visit via telephone Note  I discussed the limitations, risks, security and privacy concerns of performing an evaluation and management service by telephone and the availability of in person appointments. The patient was identified with two identifiers. Pt.expressed understanding and agreed to proceed. Pt. Is at home. Dr. Livia Snellen is in his office.  Follow Up Instructions:   I discussed the assessment and  treatment plan with the patient. The patient was provided an opportunity to ask questions and all were answered. The patient agreed with the plan and demonstrated an understanding of the instructions.   The patient was advised to call back or seek an in-person evaluation if the symptoms worsen or if the condition fails to improve as anticipated.   Total minutes including chart review and phone contact time: 13   Follow up plan: Return if symptoms worsen or fail to improve.  Claretta Fraise, MD [place]

## 2021-01-05 LAB — NOVEL CORONAVIRUS, NAA: SARS-CoV-2, NAA: NOT DETECTED

## 2021-01-05 LAB — SARS-COV-2, NAA 2 DAY TAT

## 2021-01-05 NOTE — Telephone Encounter (Signed)
Okay for note

## 2021-01-08 ENCOUNTER — Telehealth: Payer: Self-pay | Admitting: *Deleted

## 2021-01-08 DIAGNOSIS — E781 Pure hyperglyceridemia: Secondary | ICD-10-CM

## 2021-01-08 NOTE — Telephone Encounter (Signed)
[facility]ian Situ - PA Case ID: [id number] - Rx #: [id number]  Drug Fenofibrate Micronized 134MG  capsules Form [facility] Electronic Prior Authorization Form (2017 NCPDP) Original Claim Info 70 Plan ExclusionAlt Options: fenofibrate 54 mg, 145 mg,160 mg (gen Clint Lipps, [place]) For [employer] of: $18.06 submit to [employer]: [id number] PCN: CP Group: [id number] --Service provided at no cost and no switch fee to the pharmacy--  Sent to plan

## 2021-01-11 NOTE — Telephone Encounter (Signed)
PA has been denied since medication and/ or dx is not covered by benefit and are excluded from coverage in accordance with the terms and conditions of your plan of benefit. Therefore this request has been denied.    If the treating physician would like to discuss this coverage decision with the physicians or health care professional reviewer, please call [facility] PA Dept at [phone].

## 2021-01-12 MED ORDER — FENOFIBRATE 145 MG PO TABS: 145.0000 mg | ORAL_TABLET | Freq: Every day | ORAL | 2 refills | Status: DC

## 2021-01-12 NOTE — Telephone Encounter (Signed)
Dosage changed to 145 mg so that they will cover.

## 2021-01-12 NOTE — Telephone Encounter (Signed)
Patient aware and verbalizes understanding. 

## 2021-02-02 ENCOUNTER — Encounter: Payer: Self-pay | Admitting: Nurse Practitioner

## 2021-02-02 ENCOUNTER — Encounter: Payer: Self-pay | Admitting: Family Medicine

## 2021-02-02 ENCOUNTER — Ambulatory Visit (INDEPENDENT_AMBULATORY_CARE_PROVIDER_SITE_OTHER): Payer: 59 | Admitting: Nurse Practitioner

## 2021-02-02 DIAGNOSIS — J00 Acute nasopharyngitis [common cold]: Secondary | ICD-10-CM

## 2021-02-02 MED ORDER — CHLORPHEN-PE-ACETAMINOPHEN 4-10-325 MG PO TABS: 1.0000 | ORAL_TABLET | Freq: Four times a day (QID) | ORAL | 0 refills | Status: DC | PRN

## 2021-02-02 NOTE — Progress Notes (Signed)
Virtual Visit  Note Due to COVID-19 pandemic this visit was conducted virtually. This visit type was conducted due to national recommendations for restrictions regarding the COVID-19 Pandemic (e.g. social distancing, sheltering in place) in an effort to limit this patient's exposure and mitigate transmission in our community. All issues noted in this document were discussed and addressed.  A physical exam was not performed with this format.  I connected with Devin Ryan on 02/02/21 at *2:18** by telephone and verified that I am speaking with the correct person using two identifiers. Devin Ryan is currently located at work and non noe is currently with him during visit. The provider, Mary-Margaret Hassell Done, FNP is located in their office at time of visit.  I discussed the limitations, risks, security and privacy concerns of performing an evaluation and management service by telephone and the availability of in person appointments. I also discussed with the patient that there may be a patient responsible charge related to this service. The patient expressed understanding and agreed to proceed.   History and Present Illness:   Chief Complaint: URI   HPI Patient does telephone visit today c/o feeling bad. He woke up yesterday morning feeling a little feverish, sore throat and congestion.  He just took a zpak 3 weeks ago for same symptoms.    Review of Systems  Constitutional: Negative for chills and fever (feels feverish but no fever).  HENT: Positive for sore throat (scratchy). Negative for congestion, ear discharge and ear pain.   Respiratory: Positive for cough and sputum production (at times).   Musculoskeletal: Negative.   Neurological: Negative for dizziness and headaches.  Psychiatric/Behavioral: Negative.      Observations/Objective: Alert and oriented- answers all questions appropriately No distress Voice hoarse No cough    Assessment and Plan: AUDEL COAKLEY in  today with chief complaint of URI   1. Acute nasopharyngitis 1. Take meds as prescribed 2. Use a cool mist humidifier especially during the winter months and when heat has been humid. 3. Use saline nose sprays frequently 4. Saline irrigations of the nose can be very helpful if done frequently.  * 4X daily for 1 week*  * Use of a nettie pot can be helpful with this. Follow directions with this* 5. Drink plenty of fluids 6. Keep thermostat turn down low 7.For any cough or congestion  Use plain Mucinex- regular strength or max strength is fine   * Children- consult with Pharmacist for dosing 8. For fever or aces or pains- take tylenol or ibuprofen appropriate for age and weight.  * for fevers greater than 101 orally you may alternate ibuprofen and tylenol every  3 hours.   Meds ordered this encounter  Medications  . Chlorphen-PE-Acetaminophen 4-10-325 MG TABS    Sig: Take 1 tablet by mouth every 6 (six) hours as needed.    Dispense:  20 tablet    Refill:  0    Order Specific Question:   Supervising Provider    Answer:   Caryl Pina A [[id number]]       Follow Up Instructions: prn    I discussed the assessment and treatment plan with the patient. The patient was provided an opportunity to ask questions and all were answered. The patient agreed with the plan and demonstrated an understanding of the instructions.   The patient was advised to call back or seek an in-person evaluation if the symptoms worsen or if the condition fails to improve as anticipated.  The above  assessment and management plan was discussed with the patient. The patient verbalized understanding of and has agreed to the management plan. Patient is aware to call the clinic if symptoms persist or worsen. Patient is aware when to return to the clinic for a follow-up visit. Patient educated on when it is appropriate to go to the emergency department.   Time call ended:  2:30 I provided 12 minutes of  non  face-to-face time during this encounter.    Mary-Margaret Hassell Done, FNP

## 2021-02-16 ENCOUNTER — Other Ambulatory Visit: Payer: Self-pay | Admitting: Family Medicine

## 2021-02-16 DIAGNOSIS — F5101 Primary insomnia: Secondary | ICD-10-CM

## 2021-03-04 ENCOUNTER — Ambulatory Visit: Payer: 59 | Admitting: Family Medicine

## 2021-03-04 ENCOUNTER — Encounter: Payer: Self-pay | Admitting: Family Medicine

## 2021-03-04 ENCOUNTER — Other Ambulatory Visit: Payer: Self-pay

## 2021-03-04 VITALS — BP 129/85 | HR 65 | Temp 98.3°F | Ht 73.0 in | Wt 203.0 lb

## 2021-03-04 DIAGNOSIS — E781 Pure hyperglyceridemia: Secondary | ICD-10-CM

## 2021-03-04 DIAGNOSIS — Z79899 Other long term (current) drug therapy: Secondary | ICD-10-CM | POA: Diagnosis not present

## 2021-03-04 DIAGNOSIS — F5101 Primary insomnia: Secondary | ICD-10-CM

## 2021-03-04 DIAGNOSIS — N539 Unspecified male sexual dysfunction: Secondary | ICD-10-CM | POA: Diagnosis not present

## 2021-03-04 DIAGNOSIS — R7309 Other abnormal glucose: Secondary | ICD-10-CM

## 2021-03-04 LAB — BAYER DCA HB A1C WAIVED: HB A1C (BAYER DCA - WAIVED): 5.3 % (ref <7.0)

## 2021-03-04 MED ORDER — FENOFIBRATE 145 MG PO TABS: 145.0000 mg | ORAL_TABLET | Freq: Every day | ORAL | 1 refills | Status: DC

## 2021-03-04 MED ORDER — TEMAZEPAM 30 MG PO CAPS: 30.0000 mg | ORAL_CAPSULE | Freq: Every day | ORAL | 5 refills | Status: DC

## 2021-03-04 MED ORDER — SILDENAFIL CITRATE 20 MG PO TABS: 20.0000 mg | ORAL_TABLET | Freq: Every day | ORAL | 2 refills | Status: DC | PRN

## 2021-03-04 NOTE — Progress Notes (Signed)
Assessment & Plan:  1-2. Primary insomnia/Controlled substance agreement signed Well controlled on current regimen.  Controlled substance agreement updated today.  Urine drug screen collected today.  PDMP reviewed with no concerning findings. - temazepam (RESTORIL) 30 MG capsule; Take 1 capsule (30 mg total) by mouth at bedtime.  Dispense: 30 capsule; Refill: 5 - CMP14+EGFR - ToxASSURE Select 13 (MW), Urine  3. Hypertriglyceridemia Well controlled on current regimen.  - fenofibrate (TRICOR) 145 MG tablet; Take 1 tablet (145 mg total) by mouth daily.  Dispense: 90 tablet; Refill: 1 - CMP14+EGFR - Lipid panel  4. Male sexual dysfunction Prescription given for sildenafil with directions to take 1 to 5 tablets as needed.   - sildenafil (REVATIO) 20 MG tablet; Take 1-5 tablets (20-100 mg total) by mouth daily as needed.  Dispense: 30 tablet; Refill: 2  5. Elevated glucose Elevated glucose of 159 on lab work completed in October of last year.  He has not had lab work since to reevaluate. - Bayer DCA Hb A1c Waived   Return in about 6 months (around 09/04/2021) for annual physical.  Hendricks Limes, MSN, APRN, FNP-C [facility]  Subjective:    Patient ID: Devin Ryan, male    DOB: [birth year], 55 y.o.   MRN: [id number]  Patient Care Team: [name], FNP as PCP - General (Family Medicine)   Chief Complaint:  Chief Complaint  Patient presents with  . Insomnia  . Depression    Check up of chronic medical conditions     HPI: Devin Ryan is a 55 y.o. male presenting on 03/04/2021 for Insomnia and Depression (Check up of chronic medical conditions )  Insomnia: Sleeping well with temazepam 30 mg once daily.  Previously failed therapy with Lunesta and Ambien.  He was previously on Xanax to help with sleep.  Hypertriglyceridemia: Taking fenofibrate once daily.  Depression screen [facility] 2/9 02/21/2020 09/20/2019 08/23/2019  Decreased Interest 0 0 0   Down, Depressed, Hopeless 0 0 0  PHQ - 2 Score 0 0 0  Altered sleeping 0 - 1  Tired, decreased energy 1 - 2  Change in appetite 0 - 0  Feeling bad or failure about yourself  0 - 0  Trouble concentrating 0 - 0  Moving slowly or fidgety/restless 0 - 0  Suicidal thoughts 0 - 0  PHQ-9 Score 1 - 3  Difficult doing work/chores - - Somewhat difficult   GAD 7 : Generalized Anxiety Score 02/21/2020 08/23/2019 04/17/2019  Nervous, Anxious, on Edge 0 1 3  Control/stop worrying '1 1 2  ' Worry too much - different things 0 1 3  Trouble relaxing 0 1 3  Restless 0 0 0  Easily annoyed or irritable 0 1 2  Afraid - awful might happen 0 0 0  Total GAD 7 Score '1 5 13  ' Anxiety Difficulty - Somewhat difficult -    New complaints: Patient is requesting something to help with erectile dysfunction.   Social history:  Relevant past medical, surgical, family and social history reviewed and updated as indicated. Interim medical history since our last visit reviewed.  Allergies and medications reviewed and updated.  DATA REVIEWED: CHART IN EPIC  ROS: Negative unless specifically indicated above in HPI.    Current Outpatient Medications:  .  cetirizine (ZYRTEC) 10 MG tablet, Take 1 tablet (10 mg total) by mouth daily., Disp: 30 tablet, Rfl: 11 .  Chlorphen-PE-Acetaminophen 4-10-325 MG TABS, Take 1 tablet by mouth every 6 (six) hours  as needed., Disp: 20 tablet, Rfl: 0 .  fenofibrate (TRICOR) 145 MG tablet, Take 1 tablet (145 mg total) by mouth daily., Disp: 30 tablet, Rfl: 2 .  ibuprofen (ADVIL,MOTRIN) 200 MG tablet, Take 800 mg by mouth every 6 (six) hours as needed for moderate pain., Disp: , Rfl:  .  PROAIR HFA 108 (90 Base) MCG/ACT inhaler, Inhale 1 puff into the lungs every 6 (six) hours as needed. Shortness of breath, Disp: 18 g, Rfl: 1 .  temazepam (RESTORIL) 30 MG capsule, Take 1 capsule (30 mg total) by mouth at bedtime., Disp: 30 capsule, Rfl: 5   No Known Allergies Past Medical History:   Diagnosis Date  . Anxiety   . Diverticulitis   . Eczema 11/30/2018  . Headache 11/30/2018    Past Surgical History:  Procedure Laterality Date  . NOSE SURGERY    . PARTIAL COLECTOMY N/A 09/28/2015   Procedure: PARTIAL COLECTOMY;  Surgeon: Aviva Signs, MD;  Location: [facility];  Service: General;  Laterality: N/A;    Social History   Socioeconomic History  . Marital status: Single    Spouse name: Not on file  . Number of children: Not on file  . Years of education: Not on file  . Highest education level: Not on file  Occupational History  . Not on file  Tobacco Use  . Smoking status: Current Every Day Smoker    Packs/day: 0.50    Years: 30.00    Pack years: 15.00    Types: Cigarettes  . Smokeless tobacco: Never Used  Vaping Use  . Vaping Use: Every day  Substance and Sexual Activity  . Alcohol use: Yes    Comment: occ  . Drug use: No  . Sexual activity: Yes    Birth control/protection: None  Other Topics Concern  . Not on file  Social History Narrative  . Not on file   Social Determinants of Health   Financial Resource Strain: Not on file  Food Insecurity: Not on file  Transportation Needs: Not on file  Physical Activity: Not on file  Stress: Not on file  Social Connections: Not on file  Intimate Partner Violence: Not on file        Objective:    BP 129/85   Pulse 65   Temp 98.3 F (36.8 C) (Temporal)   Ht '6\' 1"'  (1.854 m)   Wt 203 lb (92.1 kg)   SpO2 97%   BMI 26.78 kg/m   Wt Readings from Last 3 Encounters:  03/04/21 203 lb (92.1 kg)  04/24/20 191 lb 12.8 oz (87 kg)  02/21/20 205 lb (93 kg)    Physical Exam Vitals reviewed.  Constitutional:      General: He is not in acute distress.    Appearance: Normal appearance. He is overweight. He is not ill-appearing, toxic-appearing or diaphoretic.  HENT:     Head: Normocephalic and atraumatic.  Eyes:     General: No scleral icterus.       Right eye: No discharge.        Left eye: No discharge.      Conjunctiva/sclera: Conjunctivae normal.  Cardiovascular:     Rate and Rhythm: Normal rate and regular rhythm.     Heart sounds: Normal heart sounds. No murmur heard. No friction rub. No gallop.   Pulmonary:     Effort: Pulmonary effort is normal. No respiratory distress.     Breath sounds: Normal breath sounds. No stridor. No wheezing, rhonchi or rales.  Musculoskeletal:  General: Normal range of motion.     Cervical back: Normal range of motion.  Skin:    General: Skin is warm and dry.  Neurological:     Mental Status: He is alert and oriented to person, place, and time. Mental status is at baseline.  Psychiatric:        Mood and Affect: Mood normal.        Behavior: Behavior normal.        Thought Content: Thought content normal.        Judgment: Judgment normal.     Lab Results  Component Value Date   TSH 2.980 10/24/2017   Lab Results  Component Value Date   WBC 8.8 07/31/2020   HGB 14.7 07/31/2020   HCT 41.8 07/31/2020   MCV 94 07/31/2020   PLT 262 07/31/2020   Lab Results  Component Value Date   NA 140 07/31/2020   K 4.0 07/31/2020   CO2 26 07/31/2020   GLUCOSE 159 (H) 07/31/2020   BUN 13 07/31/2020   CREATININE 0.95 07/31/2020   BILITOT 0.5 07/31/2020   ALKPHOS 47 07/31/2020   AST 22 07/31/2020   ALT 23 07/31/2020   PROT 7.1 07/31/2020   ALBUMIN 4.5 07/31/2020   CALCIUM 9.1 07/31/2020   ANIONGAP 8 09/30/2015   Lab Results  Component Value Date   CHOL 137 07/31/2020   Lab Results  Component Value Date   HDL 35 (L) 07/31/2020   Lab Results  Component Value Date   LDLCALC 81 07/31/2020   Lab Results  Component Value Date   TRIG 117 07/31/2020   Lab Results  Component Value Date   CHOLHDL 3.9 07/31/2020   No results found for: HGBA1C

## 2021-03-05 LAB — CMP14+EGFR
ALT: 32 IU/L (ref 0–44)
AST: 29 IU/L (ref 0–40)
Albumin/Globulin Ratio: 2.1 (ref 1.2–2.2)
Albumin: 5 g/dL — ABNORMAL HIGH (ref 3.8–4.9)
Alkaline Phosphatase: 45 IU/L (ref 44–121)
BUN/Creatinine Ratio: 7 — ABNORMAL LOW (ref 9–20)
BUN: 6 mg/dL (ref 6–24)
Bilirubin Total: 0.6 mg/dL (ref 0.0–1.2)
CO2: 26 mmol/L (ref 20–29)
Calcium: 9.7 mg/dL (ref 8.7–10.2)
Chloride: 101 mmol/L (ref 96–106)
Creatinine, Ser: 0.91 mg/dL (ref 0.76–1.27)
Globulin, Total: 2.4 g/dL (ref 1.5–4.5)
Glucose: 79 mg/dL (ref 65–99)
Potassium: 4.2 mmol/L (ref 3.5–5.2)
Sodium: 141 mmol/L (ref 134–144)
Total Protein: 7.4 g/dL (ref 6.0–8.5)
eGFR: 101 mL/min/{1.73_m2} (ref >59)

## 2021-03-05 LAB — LIPID PANEL
Chol/HDL Ratio: 4.5 ratio (ref 0.0–5.0)
Cholesterol, Total: 165 mg/dL (ref 100–199)
HDL: 37 mg/dL — ABNORMAL LOW (ref >39)
LDL Chol Calc (NIH): 108 mg/dL — ABNORMAL HIGH (ref 0–99)
Triglycerides: 107 mg/dL (ref 0–149)
VLDL Cholesterol Cal: 20 mg/dL (ref 5–40)

## 2021-03-07 ENCOUNTER — Encounter: Payer: Self-pay | Admitting: Family Medicine

## 2021-03-07 DIAGNOSIS — Z79899 Other long term (current) drug therapy: Secondary | ICD-10-CM | POA: Insufficient documentation

## 2021-03-12 LAB — TOXASSURE SELECT 13 (MW), URINE

## 2021-04-04 ENCOUNTER — Encounter: Payer: Self-pay | Admitting: Family Medicine

## 2021-04-05 ENCOUNTER — Other Ambulatory Visit: Payer: Self-pay

## 2021-04-05 ENCOUNTER — Encounter: Payer: Self-pay | Admitting: Nurse Practitioner

## 2021-04-05 ENCOUNTER — Ambulatory Visit (INDEPENDENT_AMBULATORY_CARE_PROVIDER_SITE_OTHER): Payer: 59

## 2021-04-05 ENCOUNTER — Ambulatory Visit: Payer: 59 | Admitting: Nurse Practitioner

## 2021-04-05 VITALS — BP 122/82 | HR 60 | Temp 98.1°F | Ht 73.0 in | Wt 203.0 lb

## 2021-04-05 DIAGNOSIS — M79604 Pain in right leg: Secondary | ICD-10-CM | POA: Insufficient documentation

## 2021-04-05 NOTE — Assessment & Plan Note (Signed)
New pain symptoms in shin, in the last 4 days, patient has had a hx of shine splint in the past but reports pain did not last this long. Advised patient to continue ice application, ibuprofen, rest leg and elevate. On assessment, pulse present, no abnormal swelling or redness, pain is not radiating to calf. Completed lower extremity x-ray, results pending, will treat appropriately pending results.   Follow up with worsening  symptoms.

## 2021-04-05 NOTE — Patient Instructions (Signed)
Leg Cramps Leg cramps occur when one or more muscles tighten and a person has no control over it (involuntary muscle contraction). Muscle cramps are most common in the calf muscles of the leg. They can occur during exercise or at rest. Leg cramps are painful, and they may last for a few seconds to a few minutes. Cramps may return several times before they finallystop. Usually, leg cramps are not caused by a serious medical problem. In many cases, the cause is not known. Some common causes include: Excessive physical effort (overexertion), such as during intense exercise. Doing the same motion over and over. Staying in a certain position for a long period of time. Improper preparation, form, or technique while doing a sport or an activity. Dehydration. Injury. Side effects of certain medicines. Abnormally low levels of minerals in your blood (electrolytes), especially potassium and calcium. This could result from: Pregnancy. Taking diuretic medicines. Follow these instructions at home: Eating and drinking Drink enough fluid to keep your urine pale yellow. Staying hydrated may help prevent cramps. Eat a healthy diet that includes plenty of nutrients to help your muscles function. A healthy diet includes fruits and vegetables, lean protein, whole grains, and low-fat or nonfat dairy products. Managing pain, stiffness, and swelling     Try massaging, stretching, and relaxing the affected muscle. Do this for several minutes at a time. If directed, put ice on areas that are sore or painful after a cramp. To do this: Put ice in a plastic bag. Place a towel between your skin and the bag. Leave the ice on for 20 minutes, 2-3 times a day. Remove the ice if your skin turns bright red. This is very important. If you cannot feel pain, heat, or cold, you have a greater risk of damage to the area. If directed, apply heat to muscles that are tense or tight. Do this before you exercise, or as often as told  by your health care provider. Use the heat source that your health care provider recommends, such as a moist heat pack or a heating pad. To do this: Place a towel between your skin and the heat source. Leave the heat on for 20-30 minutes. Remove the heat if your skin turns bright red. This is especially important if you are unable to feel pain, heat, or cold. You may have a greater risk of getting burned. Try taking hot showers or baths to help relax tight muscles. General instructions If you are having frequent leg cramps, avoid intense exercise for several days. Take over-the-counter and prescription medicines only as told by your health care provider. Keep all follow-up visits. This is important. Contact a health care provider if: Your leg cramps get more severe or more frequent, or they do not improve over time. Your foot becomes cold, numb, or blue. Summary Muscle cramps can develop in any muscle, but the most common place is in the calf muscles of the leg. Leg cramps are painful, and they may last for a few seconds to a few minutes. Usually, leg cramps are not caused by a serious medical problem. Often, the cause is not known. Stay hydrated, and take over-the-counter and prescription medicines only as told by your health care provider. This information is not intended to replace advice given to you by your health care provider. Make sure you discuss any questions you have with your healthcare provider. Document Revised: 02/12/2020 Document Reviewed: 02/12/2020 Elsevier Patient Education  2022 [employer]  

## 2021-04-05 NOTE — Progress Notes (Signed)
Acute Office Visit  Subjective:    Patient ID: Devin Ryan, male    DOB: 1966-04-28, 55 y.o.   MRN: [id number]  Chief Complaint  Patient presents with   shin pain    HPI Patient is in today for Pain  He reports new onset right lower leg (shin) pain. was not an injury that may have caused the pain. The pain started a few days ago and is worsening. The pain does not radiate . The pain is described as aching, is moderate in intensity, occurring constantly. Symptoms are worse in the: mid-day, afternoon  Aggravating factors: walking Relieving factors: none.  He has tried application of ice and NSAIDs with little relief.   ---------------------------------------------------------------------------------------------------   Past Medical History:  Diagnosis Date   Anxiety    Diverticulitis    Eczema 11/30/2018   Headache 11/30/2018    Past Surgical History:  Procedure Laterality Date   NOSE SURGERY     PARTIAL COLECTOMY N/A 09/28/2015   Procedure: PARTIAL COLECTOMY;  Surgeon: Aviva Signs, MD;  Location: [facility];  Service: General;  Laterality: N/A;    Family History  Problem Relation Age of Onset   Diabetes Mother    Heart disease Father        Heart attack    Social History   Socioeconomic History   Marital status: Single    Spouse name: Not on file   Number of children: Not on file   Years of education: Not on file   Highest education level: Not on file  Occupational History   Not on file  Tobacco Use   Smoking status: Every Day    Packs/day: 0.50    Years: 30.00    Pack years: 15.00    Types: Cigarettes   Smokeless tobacco: Never  Vaping Use   Vaping Use: Every day  Substance and Sexual Activity   Alcohol use: Yes    Comment: occ   Drug use: No   Sexual activity: Yes    Birth control/protection: None  Other Topics Concern   Not on file  Social History Narrative   Not on file   Social Determinants of Health   Financial Resource Strain: Not on  file  Food Insecurity: Not on file  Transportation Needs: Not on file  Physical Activity: Not on file  Stress: Not on file  Social Connections: Not on file  Intimate Partner Violence: Not on file    Outpatient Medications Prior to Visit  Medication Sig Dispense Refill   cetirizine (ZYRTEC) 10 MG tablet Take 1 tablet (10 mg total) by mouth daily. 30 tablet 11   Chlorphen-PE-Acetaminophen 4-10-325 MG TABS Take 1 tablet by mouth every 6 (six) hours as needed. 20 tablet 0   fenofibrate (TRICOR) 145 MG tablet Take 1 tablet (145 mg total) by mouth daily. 90 tablet 1   ibuprofen (ADVIL,MOTRIN) 200 MG tablet Take 800 mg by mouth every 6 (six) hours as needed for moderate pain.     PROAIR HFA 108 (90 Base) MCG/ACT inhaler Inhale 1 puff into the lungs every 6 (six) hours as needed. Shortness of breath 18 g 1   sildenafil (REVATIO) 20 MG tablet Take 1-5 tablets (20-100 mg total) by mouth daily as needed. 30 tablet 2   temazepam (RESTORIL) 30 MG capsule Take 1 capsule (30 mg total) by mouth at bedtime. 30 capsule 5   No facility-administered medications prior to visit.    No Known Allergies  Review of Systems  Constitutional:  Negative.   HENT: Negative.    Respiratory: Negative.    Cardiovascular: Negative.   Gastrointestinal: Negative.   Skin:  Negative for rash.  All other systems reviewed and are negative.     Objective:    Physical Exam Vitals and nursing note reviewed.  Constitutional:      Appearance: Normal appearance.  HENT:     Head: Normocephalic.     Nose: Nose normal.  Eyes:     Conjunctiva/sclera: Conjunctivae normal.  Cardiovascular:     Rate and Rhythm: Normal rate and regular rhythm.     Pulses: Normal pulses.     Heart sounds: Normal heart sounds.  Pulmonary:     Effort: Pulmonary effort is normal.     Breath sounds: Normal breath sounds.  Abdominal:     General: Bowel sounds are normal.  Musculoskeletal:     Right lower leg: Tenderness present. No  swelling. No edema.       Legs:     Comments: Right shine tenderness/pain  Skin:    Findings: No rash.  Neurological:     Mental Status: He is alert.    BP 122/82   Pulse 60   Temp 98.1 F (36.7 C) (Temporal)   Ht _0  (1.854 m)   Wt 203 lb (92.1 kg)   SpO2 98%   BMI 26.78 kg/m  Wt Readings from Last 3 Encounters:  04/05/21 203 lb (92.1 kg)  03/04/21 203 lb (92.1 kg)  04/24/20 191 lb 12.8 oz (87 kg)    Health Maintenance Due  Topic Date Due   COVID-19 Vaccine (1) Never done   Pneumococcal Vaccine 37-32 Years old (1 - PCV) Never done   COLONOSCOPY (Pts 45-63yr Insurance coverage will need to be confirmed)  03/10/2021    There are no preventive care reminders to display for this patient.   Lab Results  Component Value Date   TSH 2.980 10/24/2017   Lab Results  Component Value Date   WBC 8.8 07/31/2020   HGB 14.7 07/31/2020   HCT 41.8 07/31/2020   MCV 94 07/31/2020   PLT 262 07/31/2020   Lab Results  Component Value Date   NA 141 03/04/2021   K 4.2 03/04/2021   CO2 26 03/04/2021   GLUCOSE 79 03/04/2021   BUN 6 03/04/2021   CREATININE 0.91 03/04/2021   BILITOT 0.6 03/04/2021   ALKPHOS 45 03/04/2021   AST 29 03/04/2021   ALT 32 03/04/2021   PROT 7.4 03/04/2021   ALBUMIN 5.0 (H) 03/04/2021   CALCIUM 9.7 03/04/2021   ANIONGAP 8 09/30/2015   EGFR 101 03/04/2021   Lab Results  Component Value Date   CHOL 165 03/04/2021   Lab Results  Component Value Date   HDL 37 (L) 03/04/2021   Lab Results  Component Value Date   LDLCALC 108 (H) 03/04/2021   Lab Results  Component Value Date   TRIG 107 03/04/2021   Lab Results  Component Value Date   CHOLHDL 4.5 03/04/2021   Lab Results  Component Value Date   HGBA1C 5.3 03/04/2021       Assessment & Plan:   Problem List Items Addressed This Visit       Other   Right leg pain - Primary    New pain symptoms in shin, in the last 4 days, patient has had a hx of shine splint in the past but  reports pain did not last this long. Advised patient to continue ice application, ibuprofen, rest  leg and elevate. On assessment, pulse present, no abnormal swelling or redness, pain is not radiating to calf. Completed lower extremity x-ray, results pending, will treat appropriately pending results.   Follow up with worsening  symptoms.        Relevant Orders   DG Tibia/Fibula Right     No orders of the defined types were placed in this encounter.    Ivy Lynn, NP

## 2021-09-03 ENCOUNTER — Other Ambulatory Visit: Payer: Self-pay | Admitting: Family Medicine

## 2021-09-03 DIAGNOSIS — F5101 Primary insomnia: Secondary | ICD-10-CM

## 2021-09-10 ENCOUNTER — Ambulatory Visit: Payer: 59 | Admitting: Family Medicine

## 2021-09-10 ENCOUNTER — Encounter: Payer: Self-pay | Admitting: Family Medicine

## 2021-09-10 VITALS — BP 125/79 | HR 77 | Temp 98.4°F | Ht 73.0 in | Wt 211.4 lb

## 2021-09-10 DIAGNOSIS — F5101 Primary insomnia: Secondary | ICD-10-CM

## 2021-09-10 DIAGNOSIS — Z79899 Other long term (current) drug therapy: Secondary | ICD-10-CM

## 2021-09-10 DIAGNOSIS — N539 Unspecified male sexual dysfunction: Secondary | ICD-10-CM | POA: Diagnosis not present

## 2021-09-10 DIAGNOSIS — R0602 Shortness of breath: Secondary | ICD-10-CM

## 2021-09-10 DIAGNOSIS — E781 Pure hyperglyceridemia: Secondary | ICD-10-CM | POA: Diagnosis not present

## 2021-09-10 MED ORDER — PROAIR HFA 108 (90 BASE) MCG/ACT IN AERS: 1.0000 | INHALATION_SPRAY | Freq: Four times a day (QID) | RESPIRATORY_TRACT | 2 refills | Status: DC | PRN

## 2021-09-10 MED ORDER — SILDENAFIL CITRATE 50 MG PO TABS: 50.0000 mg | ORAL_TABLET | Freq: Every day | ORAL | 2 refills | Status: DC | PRN

## 2021-09-10 MED ORDER — FENOFIBRATE 145 MG PO TABS: 145.0000 mg | ORAL_TABLET | Freq: Every day | ORAL | 1 refills | Status: DC

## 2021-09-10 MED ORDER — TEMAZEPAM 30 MG PO CAPS: 30.0000 mg | ORAL_CAPSULE | Freq: Every day | ORAL | 5 refills | Status: DC

## 2021-09-10 NOTE — Progress Notes (Signed)
Assessment & Plan:  1-2. Primary insomnia/Controlled substance agreement signed Well controlled on current regimen. Controlled substance agreement in place. Urine drug screen as expected. PDMP reviewed with no concerning findings.  - temazepam (RESTORIL) 30 MG capsule; Take 1 capsule (30 mg total) by mouth at bedtime.  Dispense: 30 capsule; Refill: 5  3. Hypertriglyceridemia Well controlled on current regimen.  - fenofibrate (TRICOR) 145 MG tablet; Take 1 tablet (145 mg total) by mouth daily.  Dispense: 90 tablet; Refill: 1  4. Male sexual dysfunction Well controlled on current regimen. Increased to 50 mg tablets so he doesn't have to take as many. - sildenafil (VIAGRA) 50 MG tablet; Take 1-2 tablets (50-100 mg total) by mouth daily as needed for erectile dysfunction.  Dispense: 30 tablet; Refill: 2  5. Shortness of breath - PROAIR HFA 108 (90 Base) MCG/ACT inhaler; Inhale 1 puff into the lungs every 6 (six) hours as needed for shortness of breath or wheezing.  Dispense: 18 g; Refill: 2   Patient declined lab work today; will complete at his annual physical.  Return in about 6 months (around 03/11/2022) for annual physical.  Hendricks Limes, MSN, APRN, FNP-C [facility]  Subjective:    Patient ID: Devin Ryan, male    DOB: 1965-12-28, 55 y.o.   MRN: [id number]  Patient Care Team: [name], FNP as PCP - General (Family Medicine)   Chief Complaint:  Chief Complaint  Patient presents with   Medical Management of Chronic Issues    Check up of chronic medical conditions     HPI: Devin Ryan is a 55 y.o. male presenting on 09/10/2021 for Medical Management of Chronic Issues (Check up of chronic medical conditions )  Insomnia: Sleeping well with temazepam 30 mg once daily.  Previously failed therapy with Lunesta and Ambien.  He was previously on Xanax to help with sleep.  Hypertriglyceridemia: Taking fenofibrate once daily.  Erectile  dysfunction: using Sildenafil 20 mg tablets as needed (taking 3 at a time).   Shortness of breath: uses Albuterol occasionally. Does smoke.   New complaints: None   Social history:  Relevant past medical, surgical, family and social history reviewed and updated as indicated. Interim medical history since our last visit reviewed.  Allergies and medications reviewed and updated.  DATA REVIEWED: CHART IN EPIC  ROS: Negative unless specifically indicated above in HPI.    Current Outpatient Medications:    cetirizine (ZYRTEC) 10 MG tablet, Take 1 tablet (10 mg total) by mouth daily., Disp: 30 tablet, Rfl: 11   Chlorphen-PE-Acetaminophen 4-10-325 MG TABS, Take 1 tablet by mouth every 6 (six) hours as needed., Disp: 20 tablet, Rfl: 0   fenofibrate (TRICOR) 145 MG tablet, Take 1 tablet (145 mg total) by mouth daily., Disp: 90 tablet, Rfl: 1   ibuprofen (ADVIL,MOTRIN) 200 MG tablet, Take 800 mg by mouth every 6 (six) hours as needed for moderate pain., Disp: , Rfl:    PROAIR HFA 108 (90 Base) MCG/ACT inhaler, Inhale 1 puff into the lungs every 6 (six) hours as needed. Shortness of breath, Disp: 18 g, Rfl: 1   sildenafil (REVATIO) 20 MG tablet, Take 1-5 tablets (20-100 mg total) by mouth daily as needed., Disp: 30 tablet, Rfl: 2   temazepam (RESTORIL) 30 MG capsule, Take 1 capsule (30 mg total) by mouth at bedtime., Disp: 30 capsule, Rfl: 5   No Known Allergies Past Medical History:  Diagnosis Date   Anxiety    Diverticulitis  Eczema 11/30/2018   Headache 11/30/2018    Past Surgical History:  Procedure Laterality Date   NOSE SURGERY     PARTIAL COLECTOMY N/A 09/28/2015   Procedure: PARTIAL COLECTOMY;  Surgeon: Aviva Signs, MD;  Location: [facility];  Service: General;  Laterality: N/A;    Social History   Socioeconomic History   Marital status: Single    Spouse name: Not on file   Number of children: Not on file   Years of education: Not on file   Highest education level: Not on  file  Occupational History   Not on file  Tobacco Use   Smoking status: Every Day    Packs/day: 0.50    Years: 30.00    Pack years: 15.00    Types: Cigarettes   Smokeless tobacco: Never  Vaping Use   Vaping Use: Every day  Substance and Sexual Activity   Alcohol use: Yes    Comment: occ   Drug use: No   Sexual activity: Yes    Birth control/protection: None  Other Topics Concern   Not on file  Social History Narrative   Not on file   Social Determinants of Health   Financial Resource Strain: Not on file  Food Insecurity: Not on file  Transportation Needs: Not on file  Physical Activity: Not on file  Stress: Not on file  Social Connections: Not on file  Intimate Partner Violence: Not on file        Objective:    BP 125/79   Pulse 77   Temp 98.4 F (36.9 C) (Temporal)   Ht '6\' 1"'  (1.854 m)   Wt 211 lb 6.4 oz (95.9 kg)   SpO2 95%   BMI 27.89 kg/m   Wt Readings from Last 3 Encounters:  09/10/21 211 lb 6.4 oz (95.9 kg)  04/05/21 203 lb (92.1 kg)  03/04/21 203 lb (92.1 kg)    Physical Exam Vitals reviewed.  Constitutional:      General: He is not in acute distress.    Appearance: Normal appearance. He is overweight. He is not ill-appearing, toxic-appearing or diaphoretic.  HENT:     Head: Normocephalic and atraumatic.  Eyes:     General: No scleral icterus.       Right eye: No discharge.        Left eye: No discharge.     Conjunctiva/sclera: Conjunctivae normal.  Cardiovascular:     Rate and Rhythm: Normal rate and regular rhythm.     Heart sounds: Normal heart sounds. No murmur heard.   No friction rub. No gallop.  Pulmonary:     Effort: Pulmonary effort is normal. No respiratory distress.     Breath sounds: Normal breath sounds. No stridor. No wheezing, rhonchi or rales.  Musculoskeletal:        General: Normal range of motion.     Cervical back: Normal range of motion.  Skin:    General: Skin is warm and dry.  Neurological:     Mental Status:  He is alert and oriented to person, place, and time. Mental status is at baseline.  Psychiatric:        Mood and Affect: Mood normal.        Behavior: Behavior normal.        Thought Content: Thought content normal.        Judgment: Judgment normal.    Lab Results  Component Value Date   TSH 2.980 10/24/2017   Lab Results  Component Value Date  WBC 8.8 07/31/2020   HGB 14.7 07/31/2020   HCT 41.8 07/31/2020   MCV 94 07/31/2020   PLT 262 07/31/2020   Lab Results  Component Value Date   NA 141 03/04/2021   K 4.2 03/04/2021   CO2 26 03/04/2021   GLUCOSE 79 03/04/2021   BUN 6 03/04/2021   CREATININE 0.91 03/04/2021   BILITOT 0.6 03/04/2021   ALKPHOS 45 03/04/2021   AST 29 03/04/2021   ALT 32 03/04/2021   PROT 7.4 03/04/2021   ALBUMIN 5.0 (H) 03/04/2021   CALCIUM 9.7 03/04/2021   ANIONGAP 8 09/30/2015   EGFR 101 03/04/2021   Lab Results  Component Value Date   CHOL 165 03/04/2021   Lab Results  Component Value Date   HDL 37 (L) 03/04/2021   Lab Results  Component Value Date   LDLCALC 108 (H) 03/04/2021   Lab Results  Component Value Date   TRIG 107 03/04/2021   Lab Results  Component Value Date   CHOLHDL 4.5 03/04/2021   Lab Results  Component Value Date   HGBA1C 5.3 03/04/2021

## 2021-09-13 ENCOUNTER — Encounter: Payer: Self-pay | Admitting: Family Medicine

## 2022-03-16 ENCOUNTER — Other Ambulatory Visit: Payer: Self-pay | Admitting: Family Medicine

## 2022-03-16 DIAGNOSIS — F5101 Primary insomnia: Secondary | ICD-10-CM

## 2022-04-05 ENCOUNTER — Ambulatory Visit: Payer: 59 | Admitting: Family Medicine

## 2022-04-05 ENCOUNTER — Encounter: Payer: Self-pay | Admitting: Family Medicine

## 2022-04-05 VITALS — BP 110/66 | HR 67 | Temp 98.4°F | Ht 73.0 in | Wt 209.0 lb

## 2022-04-05 DIAGNOSIS — R5383 Other fatigue: Secondary | ICD-10-CM

## 2022-04-05 DIAGNOSIS — N539 Unspecified male sexual dysfunction: Secondary | ICD-10-CM

## 2022-04-05 DIAGNOSIS — Z79899 Other long term (current) drug therapy: Secondary | ICD-10-CM | POA: Diagnosis not present

## 2022-04-05 DIAGNOSIS — Z1211 Encounter for screening for malignant neoplasm of colon: Secondary | ICD-10-CM

## 2022-04-05 DIAGNOSIS — E781 Pure hyperglyceridemia: Secondary | ICD-10-CM | POA: Diagnosis not present

## 2022-04-05 DIAGNOSIS — F5101 Primary insomnia: Secondary | ICD-10-CM

## 2022-04-05 DIAGNOSIS — Z720 Tobacco use: Secondary | ICD-10-CM

## 2022-04-05 DIAGNOSIS — M791 Myalgia, unspecified site: Secondary | ICD-10-CM

## 2022-04-05 MED ORDER — METHOCARBAMOL 500 MG PO TABS: 500.0000 mg | ORAL_TABLET | Freq: Three times a day (TID) | ORAL | 2 refills | Status: DC | PRN

## 2022-04-05 MED ORDER — SILDENAFIL CITRATE 50 MG PO TABS: 50.0000 mg | ORAL_TABLET | Freq: Every day | ORAL | 2 refills | Status: DC | PRN

## 2022-04-05 MED ORDER — TEMAZEPAM 30 MG PO CAPS: 30.0000 mg | ORAL_CAPSULE | Freq: Every day | ORAL | 5 refills | Status: DC

## 2022-04-05 MED ORDER — FENOFIBRATE 145 MG PO TABS: 145.0000 mg | ORAL_TABLET | Freq: Every day | ORAL | 1 refills | Status: DC

## 2022-04-07 ENCOUNTER — Encounter: Payer: Self-pay | Admitting: *Deleted

## 2022-04-08 ENCOUNTER — Other Ambulatory Visit: Payer: 59

## 2022-04-08 DIAGNOSIS — F5101 Primary insomnia: Secondary | ICD-10-CM

## 2022-04-08 DIAGNOSIS — E781 Pure hyperglyceridemia: Secondary | ICD-10-CM

## 2022-04-08 DIAGNOSIS — R5383 Other fatigue: Secondary | ICD-10-CM

## 2022-04-08 DIAGNOSIS — M791 Myalgia, unspecified site: Secondary | ICD-10-CM

## 2022-04-08 DIAGNOSIS — N539 Unspecified male sexual dysfunction: Secondary | ICD-10-CM

## 2022-04-08 DIAGNOSIS — Z720 Tobacco use: Secondary | ICD-10-CM

## 2022-04-08 LAB — TOXASSURE SELECT 13 (MW), URINE

## 2022-04-13 ENCOUNTER — Encounter: Payer: Self-pay | Admitting: Family Medicine

## 2022-04-13 LAB — CMP14+EGFR
ALT: 22 IU/L (ref 0–44)
AST: 20 IU/L (ref 0–40)
Albumin/Globulin Ratio: 1.8 (ref 1.2–2.2)
Albumin: 4.4 g/dL (ref 3.8–4.9)
Alkaline Phosphatase: 50 IU/L (ref 44–121)
BUN/Creatinine Ratio: 9 (ref 9–20)
BUN: 8 mg/dL (ref 6–24)
Bilirubin Total: 0.3 mg/dL (ref 0.0–1.2)
CO2: 21 mmol/L (ref 20–29)
Calcium: 9.5 mg/dL (ref 8.7–10.2)
Chloride: 103 mmol/L (ref 96–106)
Creatinine, Ser: 0.94 mg/dL (ref 0.76–1.27)
Globulin, Total: 2.5 g/dL (ref 1.5–4.5)
Glucose: 101 mg/dL — ABNORMAL HIGH (ref 70–99)
Potassium: 4.7 mmol/L (ref 3.5–5.2)
Sodium: 139 mmol/L (ref 134–144)
Total Protein: 6.9 g/dL (ref 6.0–8.5)
eGFR: 96 mL/min/{1.73_m2} (ref >59)

## 2022-04-13 LAB — CBC WITH DIFFERENTIAL/PLATELET
Basophils Absolute: 0.1 10*3/uL (ref 0.0–0.2)
Basos: 2 %
EOS (ABSOLUTE): 0.5 10*3/uL — ABNORMAL HIGH (ref 0.0–0.4)
Eos: 7 %
Hematocrit: 44.7 % (ref 37.5–51.0)
Hemoglobin: 15 g/dL (ref 13.0–17.7)
Immature Grans (Abs): 0 10*3/uL (ref 0.0–0.1)
Immature Granulocytes: 0 %
Lymphocytes Absolute: 2.2 10*3/uL (ref 0.7–3.1)
Lymphs: 32 %
MCH: 30.2 pg (ref 26.6–33.0)
MCHC: 33.6 g/dL (ref 31.5–35.7)
MCV: 90 fL (ref 79–97)
Monocytes Absolute: 0.6 10*3/uL (ref 0.1–0.9)
Monocytes: 9 %
Neutrophils Absolute: 3.4 10*3/uL (ref 1.4–7.0)
Neutrophils: 50 %
Platelets: 286 10*3/uL (ref 150–450)
RBC: 4.96 x10E6/uL (ref 4.14–5.80)
RDW: 12 % (ref 11.6–15.4)
WBC: 6.7 10*3/uL (ref 3.4–10.8)

## 2022-04-13 LAB — TSH: TSH: 1.46 u[IU]/mL (ref 0.450–4.500)

## 2022-04-13 LAB — LIPID PANEL
Chol/HDL Ratio: 6.7 ratio — ABNORMAL HIGH (ref 0.0–5.0)
Cholesterol, Total: 195 mg/dL (ref 100–199)
HDL: 29 mg/dL — ABNORMAL LOW (ref >39)
LDL Chol Calc (NIH): 110 mg/dL — ABNORMAL HIGH (ref 0–99)
Triglycerides: 324 mg/dL — ABNORMAL HIGH (ref 0–149)
VLDL Cholesterol Cal: 56 mg/dL — ABNORMAL HIGH (ref 5–40)

## 2022-04-13 LAB — TESTOSTERONE,FREE AND TOTAL
Testosterone, Free: 3.9 pg/mL — ABNORMAL LOW (ref 7.2–24.0)
Testosterone: 329 ng/dL (ref 264–916)

## 2022-04-13 LAB — VITAMIN D 25 HYDROXY (VIT D DEFICIENCY, FRACTURES): Vit D, 25-Hydroxy: 27.9 ng/mL — ABNORMAL LOW (ref 30.0–100.0)

## 2022-04-14 ENCOUNTER — Encounter: Payer: Self-pay | Admitting: Family Medicine

## 2022-04-14 DIAGNOSIS — E559 Vitamin D deficiency, unspecified: Secondary | ICD-10-CM

## 2022-04-14 DIAGNOSIS — E782 Mixed hyperlipidemia: Secondary | ICD-10-CM

## 2022-04-14 MED ORDER — ROSUVASTATIN CALCIUM 5 MG PO TABS: 5.0000 mg | ORAL_TABLET | Freq: Every day | ORAL | 2 refills | Status: DC

## 2022-04-15 NOTE — Addendum Note (Signed)
Addended by: [name] on: 04/15/2022 10:48 AM   Modules accepted: Orders

## 2022-04-16 LAB — FSH/LH
FSH: 5.4 m[IU]/mL (ref 1.5–12.4)
LH: 4 m[IU]/mL (ref 1.7–8.6)

## 2022-04-16 LAB — SPECIMEN STATUS REPORT

## 2022-04-17 ENCOUNTER — Encounter: Payer: Self-pay | Admitting: Family Medicine

## 2022-04-17 DIAGNOSIS — R7989 Other specified abnormal findings of blood chemistry: Secondary | ICD-10-CM

## 2022-04-18 ENCOUNTER — Ambulatory Visit (INDEPENDENT_AMBULATORY_CARE_PROVIDER_SITE_OTHER): Payer: 59

## 2022-04-18 DIAGNOSIS — Z23 Encounter for immunization: Secondary | ICD-10-CM | POA: Diagnosis not present

## 2022-04-19 MED ORDER — TESTOSTERONE CYPIONATE 100 MG/ML IM SOLN: 50.0000 mg | INTRAMUSCULAR | 0 refills | Status: DC

## 2022-05-03 ENCOUNTER — Encounter: Payer: Self-pay | Admitting: *Deleted

## 2022-05-03 NOTE — Patient Instructions (Signed)
  Procedure: colonoscopy  Estimated body mass index is 27.57 kg/m as calculated from the following:   Height as of 04/05/22: '6\' 1"'$  (1.854 m).   Weight as of 04/05/22: 209 lb (94.8 kg).   Have you had a colonoscopy before?  Yes 2012 by [facility]  Do you have family history of colon cancer  no  Do you have a family history of polyps? no  Previous colonoscopy with polyps removed? no  Do you have a history colorectal cancer?   no  Are you diabetic?  no  Do you have a prosthetic or mechanical heart valve? no  Do you have a pacemaker/defibrillator?   no  Have you had endocarditis/atrial fibrillation?  no  Do you use supplemental oxygen/CPAP?  no  Have you had joint replacement within the last 12 months?  no  Do you tend to be constipated or have to use laxatives?  no   Do you have history of alcohol use? If yes, how much and how often.  no  Do you have history or are you using drugs? If yes, what do are you  using?  no  Have you ever had a stroke/heart attack?  no  Have you ever had a heart or other vascular stent placed,?no  Do you take weight loss medication? no  Do you take any blood-thinning medications such as: (Plavix, aspirin, Coumadin, Aggrenox, Brilinta, Xarelto, Eliquis, Pradaxa, Savaysa or Effient) no  If yes we need the name, milligram, dosage and who is prescribing doctor:               Current Outpatient Medications  Medication Sig Dispense Refill   cetirizine (ZYRTEC) 10 MG tablet Take 1 tablet (10 mg total) by mouth daily. 30 tablet 11   Chlorphen-PE-Acetaminophen 4-10-325 MG TABS Take 1 tablet by mouth every 6 (six) hours as needed. 20 tablet 0   fenofibrate (TRICOR) 145 MG tablet Take 1 tablet (145 mg total) by mouth daily. 90 tablet 1   ibuprofen (ADVIL,MOTRIN) 200 MG tablet Take 800 mg by mouth every 6 (six) hours as needed for moderate pain.     methocarbamol (ROBAXIN) 500 MG tablet Take 1 tablet (500 mg total) by mouth every 8 (eight) hours as needed  for muscle spasms. 60 tablet 2   PROAIR HFA 108 (90 Base) MCG/ACT inhaler Inhale 1 puff into the lungs every 6 (six) hours as needed for shortness of breath or wheezing. 18 g 2   rosuvastatin (CRESTOR) 5 MG tablet Take 1 tablet (5 mg total) by mouth daily. 30 tablet 2   sildenafil (VIAGRA) 50 MG tablet Take 1-2 tablets (50-100 mg total) by mouth daily as needed for erectile dysfunction. 30 tablet 2   temazepam (RESTORIL) 30 MG capsule Take 1 capsule (30 mg total) by mouth at bedtime. 30 capsule 5   testosterone cypionate (DEPO-TESTOSTERONE) 100 MG/ML injection Inject 0.5 mLs (50 mg total) into the muscle every 14 (fourteen) days. For IM use only 10 mL 0   No current facility-administered medications for this visit.    No Known Allergies

## 2022-05-05 ENCOUNTER — Encounter: Payer: Self-pay | Admitting: *Deleted

## 2022-05-05 MED ORDER — NA SULFATE-K SULFATE-MG SULF 17.5-3.13-1.6 GM/177ML PO SOLN: ORAL | 0 refills | Status: DC

## 2022-05-05 NOTE — Progress Notes (Signed)
PA approved via [facility]. Auth# [id number], DOS: 05/05/22-08/03/22

## 2022-05-05 NOTE — Progress Notes (Signed)
Spoke with pt. Scheduled for 8/11 at 2:30pm. Aware will mail instructions. Rx sent to pharmacy

## 2022-05-12 ENCOUNTER — Telehealth: Payer: Self-pay | Admitting: Internal Medicine

## 2022-05-12 NOTE — Telephone Encounter (Signed)
Called pt to reschedule appt and he wanted to wait until September. Will call patient when gets doctor schedule.

## 2022-05-12 NOTE — Telephone Encounter (Signed)
Pt wants to reschedule his procedure. Please call [phone]

## 2022-05-17 ENCOUNTER — Encounter (HOSPITAL_COMMUNITY): Admission: RE | Admit: 2022-05-17 | Payer: 59 | Source: Ambulatory Visit

## 2022-05-17 ENCOUNTER — Encounter: Payer: Self-pay | Admitting: *Deleted

## 2022-05-17 NOTE — Telephone Encounter (Signed)
Patient scheduled for TCS on 06/20/22 at 8 am. New instructions have been placed in the mail. Pt already has prep at the pharmacy.

## 2022-05-20 ENCOUNTER — Ambulatory Visit (HOSPITAL_COMMUNITY): Admit: 2022-05-20 | Payer: 59

## 2022-05-20 ENCOUNTER — Encounter (HOSPITAL_COMMUNITY): Payer: Self-pay

## 2022-05-20 SURGERY — COLONOSCOPY WITH PROPOFOL
Anesthesia: Monitor Anesthesia Care

## 2022-06-03 ENCOUNTER — Ambulatory Visit: Payer: 59 | Admitting: "Endocrinology

## 2022-06-03 ENCOUNTER — Encounter: Payer: Self-pay | Admitting: "Endocrinology

## 2022-06-03 VITALS — BP 122/80 | HR 64 | Ht 73.0 in | Wt 204.0 lb

## 2022-06-03 DIAGNOSIS — E559 Vitamin D deficiency, unspecified: Secondary | ICD-10-CM | POA: Diagnosis not present

## 2022-06-03 DIAGNOSIS — E782 Mixed hyperlipidemia: Secondary | ICD-10-CM

## 2022-06-03 MED ORDER — VITAMIN D (ERGOCALCIFEROL) 1.25 MG (50000 UNIT) PO CAPS: 50000.0000 [IU] | ORAL_CAPSULE | ORAL | 0 refills | Status: DC

## 2022-06-03 NOTE — Progress Notes (Signed)
Endocrinology Consult Note                                            06/03/2022, 12:58 PM   Subjective:    Patient ID: Devin Ryan, male    DOB: 07/05/1966, PCP [name], FNP   Past Medical History:  Diagnosis Date   Anxiety    Diverticulitis    Eczema 11/30/2018   Headache 11/30/2018   Hyperlipidemia    Low testosterone    Vitamin D insufficiency    Past Surgical History:  Procedure Laterality Date   NOSE SURGERY     PARTIAL COLECTOMY N/A 09/28/2015   Procedure: PARTIAL COLECTOMY;  Surgeon: Aviva Signs, MD;  Location: [facility];  Service: General;  Laterality: N/A;   Social History   Socioeconomic History   Marital status: Single    Spouse name: Not on file   Number of children: Not on file   Years of education: Not on file   Highest education level: Not on file  Occupational History   Not on file  Tobacco Use   Smoking status: Every Day    Packs/day: 0.50    Years: 30.00    Total pack years: 15.00    Types: Cigarettes   Smokeless tobacco: Never  Vaping Use   Vaping Use: Some days  Substance and Sexual Activity   Alcohol use: Yes    Comment: occ   Drug use: No   Sexual activity: Yes    Birth control/protection: None  Other Topics Concern   Not on file  Social History Narrative   Not on file   Social Determinants of Health   Financial Resource Strain: Not on file  Food Insecurity: Not on file  Transportation Needs: Not on file  Physical Activity: Not on file  Stress: Not on file  Social Connections: Not on file   Family History  Problem Relation Age of Onset   Diabetes Mother    Heart disease Father        Heart attack   Outpatient Encounter Medications as of 06/03/2022  Medication Sig   Cholecalciferol (VITAMIN D3) 125 MCG (5000 UT) TABS Take 1 tablet by mouth daily with breakfast. After finishing the Vitamin D 50,000 units weekly   Vitamin D, Ergocalciferol, (DRISDOL) 1.25 MG (50000 UNIT) CAPS capsule Take 1 capsule (50,000  Units total) by mouth every 7 (seven) days.   cetirizine (ZYRTEC) 10 MG tablet Take 1 tablet (10 mg total) by mouth daily.   Chlorphen-PE-Acetaminophen 4-10-325 MG TABS Take 1 tablet by mouth every 6 (six) hours as needed.   fenofibrate (TRICOR) 145 MG tablet Take 1 tablet (145 mg total) by mouth daily.   ibuprofen (ADVIL,MOTRIN) 200 MG tablet Take 800 mg by mouth every 6 (six) hours as needed for moderate pain.   methocarbamol (ROBAXIN) 500 MG tablet Take 1 tablet (500 mg total) by mouth every 8 (eight) hours as needed for muscle spasms.   Na Sulfate-K Sulfate-Mg Sulf 17.5-3.13-1.6 GM/177ML SOLN As directed   PROAIR HFA 108 (90 Base) MCG/ACT inhaler Inhale 1 puff into the lungs every 6 (six) hours as needed for shortness of breath or wheezing.   rosuvastatin (CRESTOR) 5 MG tablet Take 1 tablet (5 mg total) by mouth daily. (Patient not taking: Reported on 06/03/2022)   sildenafil (VIAGRA) 50 MG tablet Take 1-2  tablets (50-100 mg total) by mouth daily as needed for erectile dysfunction.   temazepam (RESTORIL) 30 MG capsule Take 1 capsule (30 mg total) by mouth at bedtime.   testosterone cypionate (DEPO-TESTOSTERONE) 100 MG/ML injection Inject 0.5 mLs (50 mg total) into the muscle every 14 (fourteen) days. For IM use only   No facility-administered encounter medications on file as of 06/03/2022.   ALLERGIES: No Known Allergies  VACCINATION STATUS: Immunization History  Administered Date(s) Administered   Tdap 04/18/2022    HPI Devin Ryan is 56 y.o. male who presents today with a medical history as above. he is being seen in consultation for vitamin D deficiency requested by [name], FNP.  Patient is accompanied by his parent to clinic.  History is obtained from the patient as well as chart review. Was found to have vitamin D deficiency recently at 27.9.  Reportedly this was despite his continued supplement of his vitamin D with 5000 units of vitamin D2 daily for the previous  several months. He denies any prior history of GI surgery, osteoporosis, fractures.  He spends most of his time indoors.  His daily intake is average.  He is other medical problems include hyperlipidemia with high LDL of 110 and triglycerides 324.  She was supposed to take Crestor, however did not start.  He is taking Tricor 145 mg p.o. daily. He does not follow any particular diet. He is also on testosterone 50 mg IM every 14 days for hypogonadism.  The details of his diagnosis are not available to review.  Review of Systems  Constitutional: no recent weight gain/loss, no fatigue, no subjective hyperthermia, no subjective hypothermia Eyes: no blurry vision, no xerophthalmia ENT: no sore throat, no nodules palpated in throat, no dysphagia/odynophagia, no hoarseness Cardiovascular: no Chest Pain, no Shortness of Breath, no palpitations, no leg swelling Respiratory: no cough, no shortness of breath Gastrointestinal: no Nausea/Vomiting/Diarhhea Musculoskeletal: no muscle/joint aches Skin: no rashes Neurological: no tremors, no numbness, no tingling, no dizziness Psychiatric: no depression, no anxiety  Objective:       06/03/2022   10:29 AM 04/05/2022    8:11 AM 09/10/2021    1:27 PM  Vitals with BMI  Height '6\' 1"'$  '6\' 1"'$  '6\' 1"'$   Weight 204 lbs 209 lbs 211 lbs 6 oz  BMI 26.92 67.20 94.7  Systolic 096 283 662  Diastolic 80 66 79  Pulse 64 67 77    BP 122/80   Pulse 64   Ht '6\' 1"'$  (1.854 m)   Wt 204 lb (92.5 kg)   BMI 26.91 kg/m   Wt Readings from Last 3 Encounters:  06/03/22 204 lb (92.5 kg)  04/05/22 209 lb (94.8 kg)  09/10/21 211 lb 6.4 oz (95.9 kg)    Physical Exam  Constitutional:  Body mass index is 26.91 kg/m.,  not in acute distress, normal state of mind Eyes: PERRLA, EOMI, no exophthalmos ENT: moist mucous membranes, no gross thyromegaly, no gross cervical lymphadenopathy Cardiovascular: normal precordial activity, Regular Rate and Rhythm, no  Murmur/Rubs/Gallops Respiratory:  adequate breathing efforts, no gross chest deformity, Clear to auscultation bilaterally Gastrointestinal: abdomen soft, Non -tender, No distension, Bowel Sounds present, no gross organomegaly Musculoskeletal: no gross deformities, strength intact in all four extremities Skin: moist, warm, no rashes Neurological: no tremor with outstretched hands, Deep tendon reflexes normal in bilateral lower extremities.  CMP ( most recent) CMP     Component Value Date/Time   NA 139 04/08/2022 0853   K 4.7 04/08/2022  0853   CL 103 04/08/2022 0853   CO2 21 04/08/2022 0853   GLUCOSE 101 (H) 04/08/2022 0853   GLUCOSE 93 09/30/2015 0546   BUN 8 04/08/2022 0853   CREATININE 0.94 04/08/2022 0853   CALCIUM 9.5 04/08/2022 0853   PROT 6.9 04/08/2022 0853   ALBUMIN 4.4 04/08/2022 0853   AST 20 04/08/2022 0853   ALT 22 04/08/2022 0853   ALKPHOS 50 04/08/2022 0853   BILITOT 0.3 04/08/2022 0853   GFRNONAA 92 07/31/2020 1505   GFRAA 106 07/31/2020 1505     Diabetic Labs (most recent): Lab Results  Component Value Date   HGBA1C 5.3 03/04/2021     Lipid Panel ( most recent) Lipid Panel     Component Value Date/Time   CHOL 195 04/08/2022 0853   TRIG 324 (H) 04/08/2022 0853   HDL 29 (L) 04/08/2022 0853   CHOLHDL 6.7 (H) 04/08/2022 0853   LDLCALC 110 (H) 04/08/2022 0853   LABVLDL 56 (H) 04/08/2022 0853      Lab Results  Component Value Date   TSH 1.460 04/08/2022   TSH 2.980 10/24/2017           Assessment & Plan:   1. Vitamin D deficiency 2. Mixed hyperlipidemia    - TARIN NAVAREZ  is being seen at a kind request of [name], FNP. - I have reviewed his available  records and clinically evaluated the patient. - Based on these reviews, he has vitamin D deficiency, hyperlipidemia.  I discussed and prescribed vitamin D 2 50,000 units weekly for 8 weeks followed by maintenance dose of vitamin D3 5000 units daily until next measurement in 3  months.  In light of his severe hyperlipidemia, hesitant to take statins, he is a good candidate for lifestyle meds.  - he acknowledges that there is a room for improvement in his food and drink choices. - Suggestion is made for him to avoid simple carbohydrates  from his diet including Cakes, Sweet Desserts, Ice Cream, Soda (diet and regular), Sweet Tea, Candies, Chips, Cookies, Store Bought Juices, Alcohol , Artificial Sweeteners,  Coffee Creamer, and "Sugar-free" Products, Lemonade. This will help patient to have more stable blood glucose profile and potentially avoid unintended weight gain.  The following Lifestyle Medicine recommendations according to [facility]) were discussed and and offered to patient and he  agrees to start the journey:  A. Whole Foods, Plant-Based Nutrition comprising of fruits and vegetables, plant-based proteins, whole-grain carbohydrates was discussed in detail with the patient.   A list for source of those nutrients were also provided to the patient.  Patient will use only water or unsweetened tea for hydration. B.  The need to stay away from risky substances including alcohol, smoking; obtaining 7 to 9 hours of restorative sleep, at least 150 minutes of moderate intensity exercise weekly, the importance of healthy social connections,  and stress management techniques were discussed. C.  A full color page of  Calorie density of various food groups per pound showing examples of each food groups was provided to the patient.  He is encouraged to continue his Crestor 5 mg p.o. nightly, Tricor 145 mg p.o. daily . - he is advised to maintain close follow up with [name], FNP for primary care needs.   - Time spent with the patient: 50 minutes, of which >50% was spent in  counseling him about his vitamin D deficiency, hyperlipidemia and the rest in obtaining information  about his symptoms, reviewing his previous labs/studies (  including abstractions from other facilities),  evaluations, and treatments,  and developing a plan to confirm diagnosis and long term treatment based on the latest standards of care/guidelines; and documenting his care. Risk reduction counseling performed per USPSTF guidelines to reduce obesity and cardiovascular risk factors.    Salvadore Oxford participated in the discussions, expressed understanding, and voiced agreement with the above plans.  All questions were answered to his satisfaction. he is encouraged to contact clinic should he have any questions or concerns prior to his return visit.  Follow up plan: Return in about 3 months (around 09/03/2022) for F/U with Pre-visit Labs.   Glade Lloyd, MD [facility] [address] [place], [place] [address] Phone: [phone]  Fax: [phone]     06/03/2022, 12:58 PM  This note was partially dictated with voice recognition software. Similar sounding words can be transcribed inadequately or may not  be corrected upon review.

## 2022-06-16 ENCOUNTER — Other Ambulatory Visit: Payer: Self-pay

## 2022-06-16 ENCOUNTER — Encounter (HOSPITAL_COMMUNITY): Payer: Self-pay

## 2022-06-16 ENCOUNTER — Encounter (HOSPITAL_COMMUNITY)
Admission: RE | Admit: 2022-06-16 | Discharge: 2022-06-16 | Disposition: A | Discharge status: Home / Self Care | Payer: 59 | Source: Ambulatory Visit | Attending: Internal Medicine | Admitting: Internal Medicine

## 2022-06-17 ENCOUNTER — Telehealth: Payer: Self-pay | Admitting: *Deleted

## 2022-06-17 NOTE — Telephone Encounter (Signed)
PA: approval: Primary Diagnosis Code: Z12.11 Description: Encounter for screening for malignant neoplasm of colon Secondary Diagnosis Code:  Description:  CPT Code GECOL Description: Colonoscopy Authorization Number: [id number] Review Date: 06/17/2022 10:12:05 AM Expiration Date: 09/15/2022 Status: Your case has been Approved

## 2022-06-20 ENCOUNTER — Encounter (HOSPITAL_COMMUNITY)
Admission: RE | Disposition: A | Discharge status: Home / Self Care | Payer: Self-pay | Source: Ambulatory Visit | Attending: Internal Medicine

## 2022-06-20 ENCOUNTER — Ambulatory Visit (HOSPITAL_COMMUNITY): Payer: 59 | Admitting: Anesthesiology

## 2022-06-20 ENCOUNTER — Other Ambulatory Visit: Payer: Self-pay

## 2022-06-20 ENCOUNTER — Ambulatory Visit (HOSPITAL_COMMUNITY)
Admission: RE | Admit: 2022-06-20 | Discharge: 2022-06-20 | Disposition: A | Discharge status: Home / Self Care | Payer: 59 | Source: Ambulatory Visit | Attending: Internal Medicine | Admitting: Internal Medicine

## 2022-06-20 ENCOUNTER — Ambulatory Visit (HOSPITAL_BASED_OUTPATIENT_CLINIC_OR_DEPARTMENT_OTHER): Payer: 59 | Admitting: Anesthesiology

## 2022-06-20 ENCOUNTER — Encounter (HOSPITAL_COMMUNITY): Payer: Self-pay

## 2022-06-20 DIAGNOSIS — Z1211 Encounter for screening for malignant neoplasm of colon: Secondary | ICD-10-CM | POA: Insufficient documentation

## 2022-06-20 DIAGNOSIS — K648 Other hemorrhoids: Secondary | ICD-10-CM | POA: Diagnosis not present

## 2022-06-20 DIAGNOSIS — Z980 Intestinal bypass and anastomosis status: Secondary | ICD-10-CM | POA: Insufficient documentation

## 2022-06-20 DIAGNOSIS — K635 Polyp of colon: Secondary | ICD-10-CM

## 2022-06-20 DIAGNOSIS — F17210 Nicotine dependence, cigarettes, uncomplicated: Secondary | ICD-10-CM | POA: Diagnosis not present

## 2022-06-20 DIAGNOSIS — D123 Benign neoplasm of transverse colon: Secondary | ICD-10-CM | POA: Diagnosis not present

## 2022-06-20 DIAGNOSIS — Z1212 Encounter for screening for malignant neoplasm of rectum: Secondary | ICD-10-CM

## 2022-06-20 HISTORY — PX: POLYPECTOMY: SHX5525

## 2022-06-20 HISTORY — PX: COLONOSCOPY WITH PROPOFOL: SHX5780

## 2022-06-20 SURGERY — COLONOSCOPY WITH PROPOFOL
Anesthesia: General

## 2022-06-20 MED ORDER — LACTATED RINGERS IV SOLN: INTRAVENOUS | Status: DC

## 2022-06-20 MED ORDER — PROPOFOL 10 MG/ML IV BOLUS
INTRAVENOUS | Status: DC | PRN
  Administered 2022-06-20: 50 mg via INTRAVENOUS
  Administered 2022-06-20 (×2): 30 mg via INTRAVENOUS
  Administered 2022-06-20 (×2): 50 mg via INTRAVENOUS
  Administered 2022-06-20: 100 mg via INTRAVENOUS

## 2022-06-20 NOTE — Anesthesia Postprocedure Evaluation (Signed)
Anesthesia Post Note  Patient: Devin Ryan  Procedure(s) Performed: COLONOSCOPY WITH PROPOFOL POLYPECTOMY  Patient location during evaluation: Phase II Anesthesia Type: General Level of consciousness: awake and alert and oriented Pain management: pain level controlled Vital Signs Assessment: post-procedure vital signs reviewed and stable Respiratory status: spontaneous breathing, nonlabored ventilation and respiratory function stable Cardiovascular status: blood pressure returned to baseline and stable Postop Assessment: no apparent nausea or vomiting Anesthetic complications: no   No notable events documented.   Last Vitals:  Vitals:   06/20/22 0712 06/20/22 0829  BP: 130/80 110/65  Pulse: (!) 51 63  Resp: 20 (!) 21  Temp: 36.6 C 36.4 C  SpO2: 97% 95%    Last Pain:  Vitals:   06/20/22 0829  TempSrc: Axillary  PainSc: 0-No pain                 Miloh Alcocer C Lore Polka

## 2022-06-20 NOTE — Op Note (Signed)
[facility] Patient Name: Devin Ryan Procedure Date: 06/20/2022 8:04 AM MRN: [id number] Date of Birth: December 16, 1965 Attending MD: Elon Alas. Edgar Frisk CSN: [id number] Age: 56 Admit Type: Outpatient Procedure:                Colonoscopy Indications:              Screening for colorectal malignant neoplasm Providers:                Elon Alas. Abbey Chatters, DO, Janeece Riggers, RN, Caprice Kluver,                            Kristine L. Risa Grill, Technician, Everardo Pacific Referring MD:              Medicines:                See the Anesthesia note for documentation of the                            administered medications Complications:            No immediate complications. Estimated Blood Loss:     Estimated blood loss was minimal. Procedure:                Pre-Anesthesia Assessment:                           - The anesthesia plan was to use monitored                            anesthesia care (MAC).                           After obtaining informed consent, the colonoscope                            was passed under direct vision. Throughout the                            procedure, the patient's blood pressure, pulse, and                            oxygen saturations were monitored continuously. The                            [id number]-[id number] ([id number]) was introduced through the                            anus and advanced to the the cecum, identified by                            appendiceal orifice and ileocecal valve. The                            colonoscopy was performed without difficulty. The                            patient  tolerated the procedure well. The quality                            of the bowel preparation was evaluated using the                            BBPS [facility] Bowel Preparation Scale) with scores                            of: Right Colon = 2 (minor amount of residual                            staining, small fragments of stool and/or opaque                             liquid, but mucosa seen well), Transverse Colon = 2                            (minor amount of residual staining, small fragments                            of stool and/or opaque liquid, but mucosa seen                            well) and Left Colon = 2 (minor amount of residual                            staining, small fragments of stool and/or opaque                            liquid, but mucosa seen well). The total BBPS score                            equals 6. The quality of the bowel preparation was                            fair. Scope In: 8:15:02 AM Scope Out: 8:27:07 AM Scope Withdrawal Time: 0 hours 10 minutes 31 seconds  Total Procedure Duration: 0 hours 12 minutes 5 seconds  Findings:      The perianal and digital rectal examinations were normal.      Non-bleeding internal hemorrhoids were found during endoscopy.      There was evidence of a prior end-to-side colo-colonic anastomosis in       the sigmoid colon. This was patent and was characterized by healthy       appearing mucosa. The anastomosis was traversed.      A 5 mm polyp was found in the transverse colon. The polyp was sessile.       The polyp was removed with a cold snare. Resection and retrieval were       complete.      The exam was otherwise without abnormality. Impression:               - Preparation of the colon was fair.                           -  Non-bleeding internal hemorrhoids.                           - Patent end-to-side colo-colonic anastomosis,                            characterized by healthy appearing mucosa.                           - One 5 mm polyp in the transverse colon, removed                            with a cold snare. Resected and retrieved.                           - The examination was otherwise normal. Moderate Sedation:      Per Anesthesia Care Recommendation:           - Patient has a contact number available for                            emergencies. The  signs and symptoms of potential                            delayed complications were discussed with the                            patient. Return to normal activities tomorrow.                            Written discharge instructions were provided to the                            patient.                           - Resume previous diet.                           - Continue present medications.                           - Await pathology results.                           - Repeat colonoscopy in 5 years for surveillance                            and borderline prep                           - Return to GI clinic PRN. Procedure Code(s):        --- Professional ---                           [phone], Colonoscopy, flexible; with removal of  tumor(s), polyp(s), or other lesion(s) by snare                            technique Diagnosis Code(s):        --- Professional ---                           Z12.11, Encounter for screening for malignant                            neoplasm of colon                           K64.8, Other hemorrhoids                           Z98.0, Intestinal bypass and anastomosis status                           K63.5, Polyp of colon CPT copyright 2019 American Medical Association. All rights reserved. The codes documented in this report are preliminary and upon coder review may  be revised to meet current compliance requirements. Elon Alas. Abbey Chatters, DO [place] Abbey Chatters, DO 06/20/2022 8:30:11 AM This report has been signed electronically. Number of Addenda: 0

## 2022-06-20 NOTE — Discharge Instructions (Addendum)

## 2022-06-20 NOTE — Anesthesia Preprocedure Evaluation (Signed)
Anesthesia Evaluation  Patient identified by MRN, date of birth, ID band Patient awake    Reviewed: Allergy & Precautions, NPO status , Patient's Chart, lab work & pertinent test results  Airway Mallampati: II  TM Distance: >3 FB Neck ROM: Full    Dental  (+) Dental Advisory Given, Missing, Partial Upper   Pulmonary Current Smoker and Patient abstained from smoking.,    Pulmonary exam normal breath sounds clear to auscultation       Cardiovascular negative cardio ROS Normal cardiovascular exam Rhythm:Regular Rate:Bradycardia     Neuro/Psych  Headaches, negative psych ROS   GI/Hepatic Neg liver ROS, Bowel prep,Colon resection    Endo/Other  negative endocrine ROS  Renal/GU negative Renal ROS  negative genitourinary   Musculoskeletal negative musculoskeletal ROS (+)   Abdominal   Peds negative pediatric ROS (+)  Hematology negative hematology ROS (+)   Anesthesia Other Findings   Reproductive/Obstetrics negative OB ROS                            Anesthesia Physical Anesthesia Plan  ASA: 2  Anesthesia Plan: General   Post-op Pain Management: Minimal or no pain anticipated   Induction: Intravenous  PONV Risk Score and Plan: Propofol infusion  Airway Management Planned: Nasal Cannula and Natural Airway  Additional Equipment:   Intra-op Plan:   Post-operative Plan:   Informed Consent: I have reviewed the patients History and Physical, chart, labs and discussed the procedure including the risks, benefits and alternatives for the proposed anesthesia with the patient or authorized representative who has indicated his/her understanding and acceptance.     Dental advisory given  Plan Discussed with: CRNA and Surgeon  Anesthesia Plan Comments:         Anesthesia Quick Evaluation

## 2022-06-20 NOTE — Transfer of Care (Signed)
Immediate Anesthesia Transfer of Care Note  Patient: Devin Ryan  Procedure(s) Performed: COLONOSCOPY WITH PROPOFOL POLYPECTOMY  Patient Location: Short Stay  Anesthesia Type:General  Level of Consciousness: awake, alert  and oriented  Airway & Oxygen Therapy: Patient Spontanous Breathing  Post-op Assessment: Report given to RN and Post -op Vital signs reviewed and stable  Post vital signs: Reviewed and stable  Last Vitals:  Vitals Value Taken Time  BP 110/65 06/20/22 0829  Temp 36.4 C 06/20/22 0829  Pulse 63 06/20/22 0829  Resp 21 06/20/22 0829  SpO2 95 % 06/20/22 0829    Last Pain:  Vitals:   06/20/22 0829  TempSrc: Axillary  PainSc: 0-No pain      Patients Stated Pain Goal: 8 (60/60/04 5997)  Complications: No notable events documented.

## 2022-06-20 NOTE — H&P (Signed)
Primary Care Physician:  [name], FNP Primary Gastroenterologist:  Dr. Abbey Chatters  Pre-Procedure History & Physical: HPI:  Devin Ryan is a 56 y.o. male is here for a colonoscopy for colon cancer screening purposes.  Patient denies any family history of colorectal cancer.  No melena or hematochezia.  No abdominal pain or unintentional weight loss.  No change in bowel habits.  Overall feels well from a GI standpoint.  Past Medical History:  Diagnosis Date   Anxiety    Diverticulitis    Eczema 11/30/2018   Headache 11/30/2018   Hyperlipidemia    Low testosterone    Vitamin D insufficiency     Past Surgical History:  Procedure Laterality Date   NOSE SURGERY     PARTIAL COLECTOMY N/A 09/28/2015   Procedure: PARTIAL COLECTOMY;  Surgeon: Aviva Signs, MD;  Location: [facility];  Service: General;  Laterality: N/A;    Prior to Admission medications   Medication Sig Start Date End Date Taking? Authorizing Provider  cetirizine (ZYRTEC) 10 MG tablet Take 1 tablet (10 mg total) by mouth daily. Patient taking differently: Take 10 mg by mouth daily as needed for allergies. 04/24/20  Yes Hawks, Christy A, FNP  Cholecalciferol (VITAMIN D3) 125 MCG (5000 UT) TABS Take 10,000 Units by mouth daily with breakfast.   Yes [provider]  fenofibrate (TRICOR) 145 MG tablet Take 1 tablet (145 mg total) by mouth daily. 04/05/22  Yes Hendricks Limes F, FNP  ibuprofen (ADVIL,MOTRIN) 200 MG tablet Take 800 mg by mouth every 6 (six) hours as needed for moderate pain.   Yes [provider]  methocarbamol (ROBAXIN) 500 MG tablet Take 1 tablet (500 mg total) by mouth every 8 (eight) hours as needed for muscle spasms. 04/05/22  Yes Hendricks Limes F, FNP  Na Sulfate-K Sulfate-Mg Sulf 17.5-3.13-1.6 GM/177ML SOLN As directed 05/05/22  Yes Shiree Altemus, Elon Alas, DO  PROAIR HFA 108 [phone] Base) MCG/ACT inhaler Inhale 1 puff into the lungs every 6 (six) hours as needed for shortness of breath or wheezing.  09/10/21  Yes Hendricks Limes F, FNP  sildenafil (VIAGRA) 50 MG tablet Take 1-2 tablets (50-100 mg total) by mouth daily as needed for erectile dysfunction. 04/05/22  Yes [name], FNP  temazepam (RESTORIL) 30 MG capsule Take 1 capsule (30 mg total) by mouth at bedtime. Patient taking differently: Take 30 mg by mouth at bedtime as needed for sleep. 04/05/22  Yes Hendricks Limes F, FNP  testosterone cypionate (DEPO-TESTOSTERONE) 100 MG/ML injection Inject 0.5 mLs (50 mg total) into the muscle every 14 (fourteen) days. For IM use only 04/19/22  Yes Hendricks Limes F, FNP  Vitamin D, Ergocalciferol, (DRISDOL) 1.25 MG (50000 UNIT) CAPS capsule Take 1 capsule (50,000 Units total) by mouth every 7 (seven) days. 06/03/22  Yes Nida, Marella Chimes, MD  vitamin E 180 MG (400 UNITS) capsule Take 400 Units by mouth daily.   Yes [provider]    Allergies as of 05/17/2022   (No Known Allergies)    Family History  Problem Relation Age of Onset   Diabetes Mother    Heart disease Father        Heart attack    Social History   Socioeconomic History   Marital status: Single    Spouse name: Not on file   Number of children: Not on file   Years of education: Not on file   Highest education level: Not on file  Occupational History   Not on file  Tobacco Use   Smoking status: Every Day    Packs/day: 0.50    Years: 30.00    Total pack years: 15.00    Types: Cigarettes   Smokeless tobacco: Never  Vaping Use   Vaping Use: Never used  Substance and Sexual Activity   Alcohol use: Yes    Comment: occ   Drug use: No   Sexual activity: Yes    Birth control/protection: None  Other Topics Concern   Not on file  Social History Narrative   Not on file   Social Determinants of Health   Financial Resource Strain: Not on file  Food Insecurity: Not on file  Transportation Needs: Not on file  Physical Activity: Not on file  Stress: Not on file  Social Connections: Not on file   Intimate Partner Violence: Not on file    Review of Systems: See HPI, otherwise negative ROS  Physical Exam: Vital signs in last 24 hours: Temp:  [97.8 F (36.6 C)] 97.8 F (36.6 C) (09/11 0712) Pulse Rate:  [51] 51 (09/11 0712) Resp:  [20] 20 (09/11 0712) BP: (130)/(80) 130/80 (09/11 0712) SpO2:  [97 %] 97 % (09/11 0712) Weight:  [90.7 kg] 90.7 kg (09/11 0712)   General:   Alert,  Well-developed, well-nourished, pleasant and cooperative in NAD Head:  Normocephalic and atraumatic. Eyes:  Sclera clear, no icterus.   Conjunctiva pink. Ears:  Normal auditory acuity. Nose:  No deformity, discharge,  or lesions. Mouth:  No deformity or lesions, dentition normal. Neck:  Supple; no masses or thyromegaly. Lungs:  Clear throughout to auscultation.   No wheezes, crackles, or rhonchi. No acute distress. Heart:  Regular rate and rhythm; no murmurs, clicks, rubs,  or gallops. Abdomen:  Soft, nontender and nondistended. No masses, hepatosplenomegaly or hernias noted. Normal bowel sounds, without guarding, and without rebound.   Msk:  Symmetrical without gross deformities. Normal posture. Extremities:  Without clubbing or edema. Neurologic:  Alert and  oriented x4;  grossly normal neurologically. Skin:  Intact without significant lesions or rashes. Cervical Nodes:  No significant cervical adenopathy. Psych:  Alert and cooperative. Normal mood and affect.  Impression/Plan: Devin Ryan is here for a colonoscopy to be performed for colon cancer screening purposes.  The risks of the procedure including infection, bleed, or perforation as well as benefits, limitations, alternatives and imponderables have been reviewed with the patient. Questions have been answered. All parties agreeable.

## 2022-06-21 LAB — SURGICAL PATHOLOGY

## 2022-06-27 ENCOUNTER — Encounter (HOSPITAL_COMMUNITY): Payer: Self-pay | Admitting: Internal Medicine

## 2022-06-28 ENCOUNTER — Telehealth: Payer: Self-pay | Admitting: *Deleted

## 2022-06-28 NOTE — Telephone Encounter (Signed)
Vonda Antigua KeyRenae Gloss - PA Case ID: [id number] - Rx #: [id number] Need help? Call us at [phone] Status Sent to [facility] Testosterone Cypionate '100MG'$ /ML intramuscular solution

## 2022-06-29 NOTE — Telephone Encounter (Signed)
Testosterone Cypionate '100mg'$ /ml is on your plans list of covered drugs. PA is not required at this time. If your pharmacy has questions regarding the processing of you rx, please have them call the [facility] pharmacy help desk at [phone].

## 2022-07-26 ENCOUNTER — Other Ambulatory Visit: Payer: Self-pay | Admitting: Family Medicine

## 2022-07-26 DIAGNOSIS — R0602 Shortness of breath: Secondary | ICD-10-CM

## 2022-08-29 ENCOUNTER — Encounter: Payer: Self-pay | Admitting: Family Medicine

## 2022-08-31 ENCOUNTER — Other Ambulatory Visit: Payer: 59

## 2022-09-01 LAB — LIPID PANEL
Chol/HDL Ratio: 4.7 ratio (ref 0.0–5.0)
Cholesterol, Total: 168 mg/dL (ref 100–199)
HDL: 36 mg/dL — ABNORMAL LOW (ref >39)
LDL Chol Calc (NIH): 112 mg/dL — ABNORMAL HIGH (ref 0–99)
Triglycerides: 108 mg/dL (ref 0–149)
VLDL Cholesterol Cal: 20 mg/dL (ref 5–40)

## 2022-09-01 LAB — VITAMIN D 25 HYDROXY (VIT D DEFICIENCY, FRACTURES): Vit D, 25-Hydroxy: 44.3 ng/mL (ref 30.0–100.0)

## 2022-09-05 ENCOUNTER — Ambulatory Visit: Payer: 59 | Admitting: "Endocrinology

## 2022-09-05 ENCOUNTER — Encounter: Payer: Self-pay | Admitting: "Endocrinology

## 2022-09-05 VITALS — BP 124/84 | HR 56 | Ht 73.0 in | Wt 209.0 lb

## 2022-09-05 DIAGNOSIS — E559 Vitamin D deficiency, unspecified: Secondary | ICD-10-CM

## 2022-09-05 DIAGNOSIS — E782 Mixed hyperlipidemia: Secondary | ICD-10-CM

## 2022-09-05 NOTE — Patient Instructions (Signed)
                                     Advice for Weight Management  -For most of us the best way to lose weight is by diet management. Generally speaking, diet management means consuming less calories intentionally which over time brings about progressive weight loss.  This can be achieved more effectively by avoiding ultra processed carbohydrates, processed meats, unhealthy fats.    It is critically important to know your numbers: how much calorie you are consuming and how much calorie you need. More importantly, our carbohydrates sources should be unprocessed naturally occurring  complex starch food items.  It is always important to balance nutrition also by  appropriate intake of proteins (mainly plant-based), healthy fats/oils, plenty of fruits and vegetables.   -The American College of Lifestyle Medicine (ACL M) recommends nutrition derived mostly from Whole Food, Plant Predominant Sources example an apple instead of applesauce or apple pie. Eat Plenty of vegetables, Mushrooms, fruits, Legumes, Whole Grains, Nuts, seeds in lieu of processed meats, processed snacks/pastries red meat, poultry, eggs.  Use only water or unsweetened tea for hydration.  The College also recommends the need to stay away from risky substances including alcohol, smoking; obtaining 7-9 hours of restorative sleep, at least 150 minutes of moderate intensity exercise weekly, importance of healthy social connections, and being mindful of stress and seek help when it is overwhelming.    -Sticking to a routine mealtime to eat 3 meals a day and avoiding unnecessary snacks is shown to have a big role in weight control. Under normal circumstances, the only time we burn stored energy is when we are hungry, so allow  some hunger to take place- hunger means no food between appropriate meal times, only water.  It is not advisable to starve.   -It is better to avoid simple carbohydrates including:  Cakes, Sweet Desserts, Ice Cream, Soda (diet and regular), Sweet Tea, Candies, Chips, Cookies, Store Bought Juices, Alcohol in Excess of  1-2 drinks a day, Lemonade,  Artificial Sweeteners, Doughnuts, Coffee Creamers, "Sugar-free" Products, etc, etc.  This is not a complete list.....    -Consulting with certified diabetes educators is proven to provide you with the most accurate and current information on diet.  Also, you may be  interested in discussing diet options/exchanges , we can schedule a visit with Devin Ryan, RDN, CDE for individualized nutrition education.  -Exercise: If you are able: 30 -60 minutes a day ,4 days a week, or 150 minutes of moderate intensity exercise weekly.    The longer the better if tolerated.  Combine stretch, strength, and aerobic activities.  If you were told in the past that you have high risk for cardiovascular diseases, or if you are currently symptomatic, you may seek evaluation by your heart doctor prior to initiating moderate to intense exercise programs.                                  Additional Care Considerations for Diabetes/Prediabetes   -Diabetes  is a chronic disease.  The most important care consideration is regular follow-up with your diabetes care provider with the goal being avoiding or delaying its complications and to take advantage of advances in medications and technology.  If appropriate actions are taken early enough, type 2 diabetes can even be   reversed.  Seek information from the right source.  - Whole Food, Plant Predominant Nutrition is highly recommended: Eat Plenty of vegetables, Mushrooms, fruits, Legumes, Whole Grains, Nuts, seeds in lieu of processed meats, processed snacks/pastries red meat, poultry, eggs as recommended by American College of  Lifestyle Medicine (ACLM).  -Type 2 diabetes is known to coexist with other important comorbidities such as high blood pressure and high cholesterol.  It is critical to control not only the  diabetes but also the high blood pressure and high cholesterol to minimize and delay the risk of complications including coronary artery disease, stroke, amputations, blindness, etc.  The good news is that this diet recommendation for type 2 diabetes is also very helpful for managing high cholesterol and high blood blood pressure.  - Studies showed that people with diabetes will benefit from a class of medications known as ACE inhibitors and statins.  Unless there are specific reasons not to be on these medications, the standard of care is to consider getting one from these groups of medications at an optimal doses.  These medications are generally considered safe and proven to help protect the heart and the kidneys.    - People with diabetes are encouraged to initiate and maintain regular follow-up with eye doctors, foot doctors, dentists , and if necessary heart and kidney doctors.     - It is highly recommended that people with diabetes quit smoking or stay away from smoking, and get yearly  flu vaccine and pneumonia vaccine at least every 5 years.  See above for additional recommendations on exercise, sleep, stress management , and healthy social connections.      

## 2022-09-05 NOTE — Progress Notes (Signed)
09/05/2022, 6:18 PM  Endocrinology follow-up note   Subjective:    Patient ID: Devin Ryan, male    DOB: 26-Aug-1966, PCP Pcp, No   Past Medical History:  Diagnosis Date   Anxiety    Diverticulitis    Eczema 11/30/2018   Headache 11/30/2018   Hyperlipidemia    Low testosterone    Vitamin D insufficiency    Past Surgical History:  Procedure Laterality Date   COLONOSCOPY WITH PROPOFOL N/A 06/20/2022   Procedure: COLONOSCOPY WITH PROPOFOL;  Surgeon: Eloise Harman, DO;  [facility];  Service: Endoscopy;  Laterality: N/A;  8:00am, asa 2   NOSE SURGERY     PARTIAL COLECTOMY N/A 09/28/2015   Procedure: PARTIAL COLECTOMY;  Surgeon: Aviva Signs, MD;  Location: [facility];  Service: General;  Laterality: N/A;   POLYPECTOMY  06/20/2022   Procedure: POLYPECTOMY;  Surgeon: Eloise Harman, DO;  [facility];  Service: Endoscopy;;   Social History   Socioeconomic History   Marital status: Single    Spouse name: Not on file   Number of children: Not on file   Years of education: Not on file   Highest education level: Not on file  Occupational History   Not on file  Tobacco Use   Smoking status: Every Day    Packs/day: 0.50    Years: 30.00    Total pack years: 15.00    Types: Cigarettes   Smokeless tobacco: Never  Vaping Use   Vaping Use: Never used  Substance and Sexual Activity   Alcohol use: Yes    Comment: occ   Drug use: No   Sexual activity: Yes    Birth control/protection: None  Other Topics Concern   Not on file  Social History Narrative   Not on file   Social Determinants of Health   Financial Resource Strain: Not on file  Food Insecurity: Not on file  Transportation Needs: Not on file  Physical Activity: Not on file  Stress: Not on file  Social Connections: Not on file   Family History  Problem Relation Age of Onset   Diabetes Mother    Heart disease Father        Heart  attack   Outpatient Encounter Medications as of 09/05/2022  Medication Sig   albuterol (VENTOLIN HFA) 108 (90 Base) MCG/ACT inhaler INHALE 1 PUFF INTO THE LUNGS EVERY 6 HOURS AS NEEDED FOR SHORTNESS OF BREATH   cetirizine (ZYRTEC) 10 MG tablet Take 1 tablet (10 mg total) by mouth daily. (Patient taking differently: Take 10 mg by mouth daily as needed for allergies.)   Cholecalciferol (VITAMIN D3) 125 MCG (5000 UT) TABS Take 5,000 Units by mouth daily with breakfast.   fenofibrate (TRICOR) 145 MG tablet Take 1 tablet (145 mg total) by mouth daily. (Patient not taking: Reported on 09/05/2022)   ibuprofen (ADVIL,MOTRIN) 200 MG tablet Take 800 mg by mouth every 6 (six) hours as needed for moderate pain.   methocarbamol (ROBAXIN) 500 MG tablet Take 1 tablet (500 mg total) by mouth every 8 (eight) hours as needed for muscle spasms. (Patient not taking: Reported on 09/05/2022)   sildenafil (VIAGRA) 50 MG tablet Take 1-2 tablets (50-100 mg  total) by mouth daily as needed for erectile dysfunction.   temazepam (RESTORIL) 30 MG capsule Take 1 capsule (30 mg total) by mouth at bedtime. (Patient taking differently: Take 30 mg by mouth at bedtime as needed for sleep.)   testosterone cypionate (DEPO-TESTOSTERONE) 100 MG/ML injection Inject 0.5 mLs (50 mg total) into the muscle every 14 (fourteen) days. For IM use only (Patient not taking: Reported on 09/05/2022)   vitamin E 180 MG (400 UNITS) capsule Take 400 Units by mouth daily.   [DISCONTINUED] Na Sulfate-K Sulfate-Mg Sulf 17.5-3.13-1.6 GM/177ML SOLN As directed   [DISCONTINUED] Vitamin D, Ergocalciferol, (DRISDOL) 1.25 MG (50000 UNIT) CAPS capsule Take 1 capsule (50,000 Units total) by mouth every 7 (seven) days. (Patient not taking: Reported on 09/05/2022)   No facility-administered encounter medications on file as of 09/05/2022.   ALLERGIES: No Known Allergies  VACCINATION STATUS: Immunization History  Administered Date(s) Administered   Tdap  04/18/2022    HPI Devin Ryan is 56 y.o. male who presents today with a medical history as above. he is being seen in follow-up after he was seen in consultation for vitamin D deficiency . His previsit labs show improved vitamin D at 44 from 27.9.  He has finished his weekly supplies of high-dose vitamin D, for the last several weeks remained only on vitamin D3 10,000 units daily.   He denies any prior history of GI surgery, osteoporosis, fractures.  He spends most of his time indoors.  His daily intake is average.  He is other medical problems include hyperlipidemia with high LDL of 112 and triglycerides 103.  He is on Tricor, does not want to take statins.   He does not follow any particular diet.  Review of Systems  Constitutional: no recent weight gain/loss, no fatigue, no subjective hyperthermia, no subjective hypothermia Eyes: no blurry vision, no xerophthalmia ENT: no sore throat, no nodules palpated in throat, no dysphagia/odynophagia, no hoarseness   Objective:       09/05/2022    3:42 PM 06/20/2022    8:29 AM 06/20/2022    7:12 AM  Vitals with BMI  Height '6\' 1"'$   '5\' 11"'$   Weight 209 lbs  200 lbs  BMI 28.78  67.67  Systolic 209 470 962  Diastolic 84 65 80  Pulse 56 63 51    BP 124/84   Pulse (!) 56   Ht '6\' 1"'$  (1.854 m)   Wt 209 lb (94.8 kg)   BMI 27.57 kg/m   Wt Readings from Last 3 Encounters:  09/05/22 209 lb (94.8 kg)  06/20/22 200 lb (90.7 kg)  06/16/22 200 lb (90.7 kg)    Physical Exam  Constitutional:  Body mass index is 27.57 kg/m.,  not in acute distress, normal state of mind Eyes: PERRLA, EOMI, no exophthalmos   CMP ( most recent) CMP     Component Value Date/Time   NA 139 04/08/2022 0853   K 4.7 04/08/2022 0853   CL 103 04/08/2022 0853   CO2 21 04/08/2022 0853   GLUCOSE 101 (H) 04/08/2022 0853   GLUCOSE 93 09/30/2015 0546   BUN 8 04/08/2022 0853   CREATININE 0.94 04/08/2022 0853   CALCIUM 9.5 04/08/2022 0853   PROT 6.9 04/08/2022  0853   ALBUMIN 4.4 04/08/2022 0853   AST 20 04/08/2022 0853   ALT 22 04/08/2022 0853   ALKPHOS 50 04/08/2022 0853   BILITOT 0.3 04/08/2022 0853   GFRNONAA 92 07/31/2020 1505   GFRAA 106 07/31/2020 1505  Diabetic Labs (most recent): Lab Results  Component Value Date   HGBA1C 5.3 03/04/2021     Lipid Panel ( most recent) Lipid Panel     Component Value Date/Time   CHOL 168 08/31/2022 0809   TRIG 108 08/31/2022 0809   HDL 36 (L) 08/31/2022 0809   CHOLHDL 4.7 08/31/2022 0809   LDLCALC 112 (H) 08/31/2022 0809   LABVLDL 20 08/31/2022 0809      Lab Results  Component Value Date   TSH 1.460 04/08/2022   TSH 2.980 10/24/2017           Assessment & Plan:   1. Vitamin D deficiency 2. Mixed hyperlipidemia -His vitamin D deficiency is now corrected.  He is advised to continue vitamin D3 5000 units daily until next measurement.  In light of his severe hyperlipidemia, hesitant to take statins, he is a good candidate for lifestyle medicine.   - he acknowledges that there is a room for improvement in his food and drink choices. - Suggestion is made for him to avoid simple carbohydrates  from his diet including Cakes, Sweet Desserts, Ice Cream, Soda (diet and regular), Sweet Tea, Candies, Chips, Cookies, Store Bought Juices, Alcohol , Artificial Sweeteners,  Coffee Creamer, and "Sugar-free" Products, Lemonade. This will help patient to have more stable blood glucose profile and potentially avoid unintended weight gain.  The following Lifestyle Medicine recommendations according to [facility]) were discussed and and offered to patient and he  agrees to start the journey:  A. Whole Foods, Plant-Based Nutrition comprising of fruits and vegetables, plant-based proteins, whole-grain carbohydrates was discussed in detail with the patient.   A list for source of those nutrients were also provided to the patient.  Patient will use only water or  unsweetened tea for hydration. B.  The need to stay away from risky substances including alcohol, smoking; obtaining 7 to 9 hours of restorative sleep, at least 150 minutes of moderate intensity exercise weekly, the importance of healthy social connections,  and stress management techniques were discussed. C.  A full color page of  Calorie density of various food groups per pound showing examples of each food groups was provided to the patient.   He is encouraged to continue his  Tricor 145 mg p.o. daily . - he is advised to maintain close follow up with his PCP for primary care needs.   I spent 21 minutes in the care of the patient today including review of labs from Thyroid Function, CMP, and other relevant labs ; imaging/biopsy records (current and previous including abstractions from other facilities); face-to-face time discussing  his lab results and symptoms, medications doses, his options of short and long term treatment based on the latest standards of care / guidelines;   and documenting the encounter.  Salvadore Oxford  participated in the discussions, expressed understanding, and voiced agreement with the above plans.  All questions were answered to his satisfaction. he is encouraged to contact clinic should he have any questions or concerns prior to his return visit.   Follow up plan: Return in about 6 months (around 03/06/2023) for Fasting Labs  in AM B4 8.   Glade Lloyd, MD [facility] [address] [place], [place] [address] Phone: [phone]  Fax: [phone]     09/05/2022, 6:18 PM  This note was partially dictated with voice recognition software. Similar sounding words can be transcribed inadequately or may not  be  corrected upon review.

## 2022-10-21 IMAGING — DX DG TIBIA/FIBULA 2V*R*
3 series · 3 of 3 positions shown · non-contrast
Comparison: None.

CLINICAL DATA: Shin pain.

EXAM:
RIGHT TIBIA AND FIBULA - 2 VIEW

[tibia ap (1 of 2)]
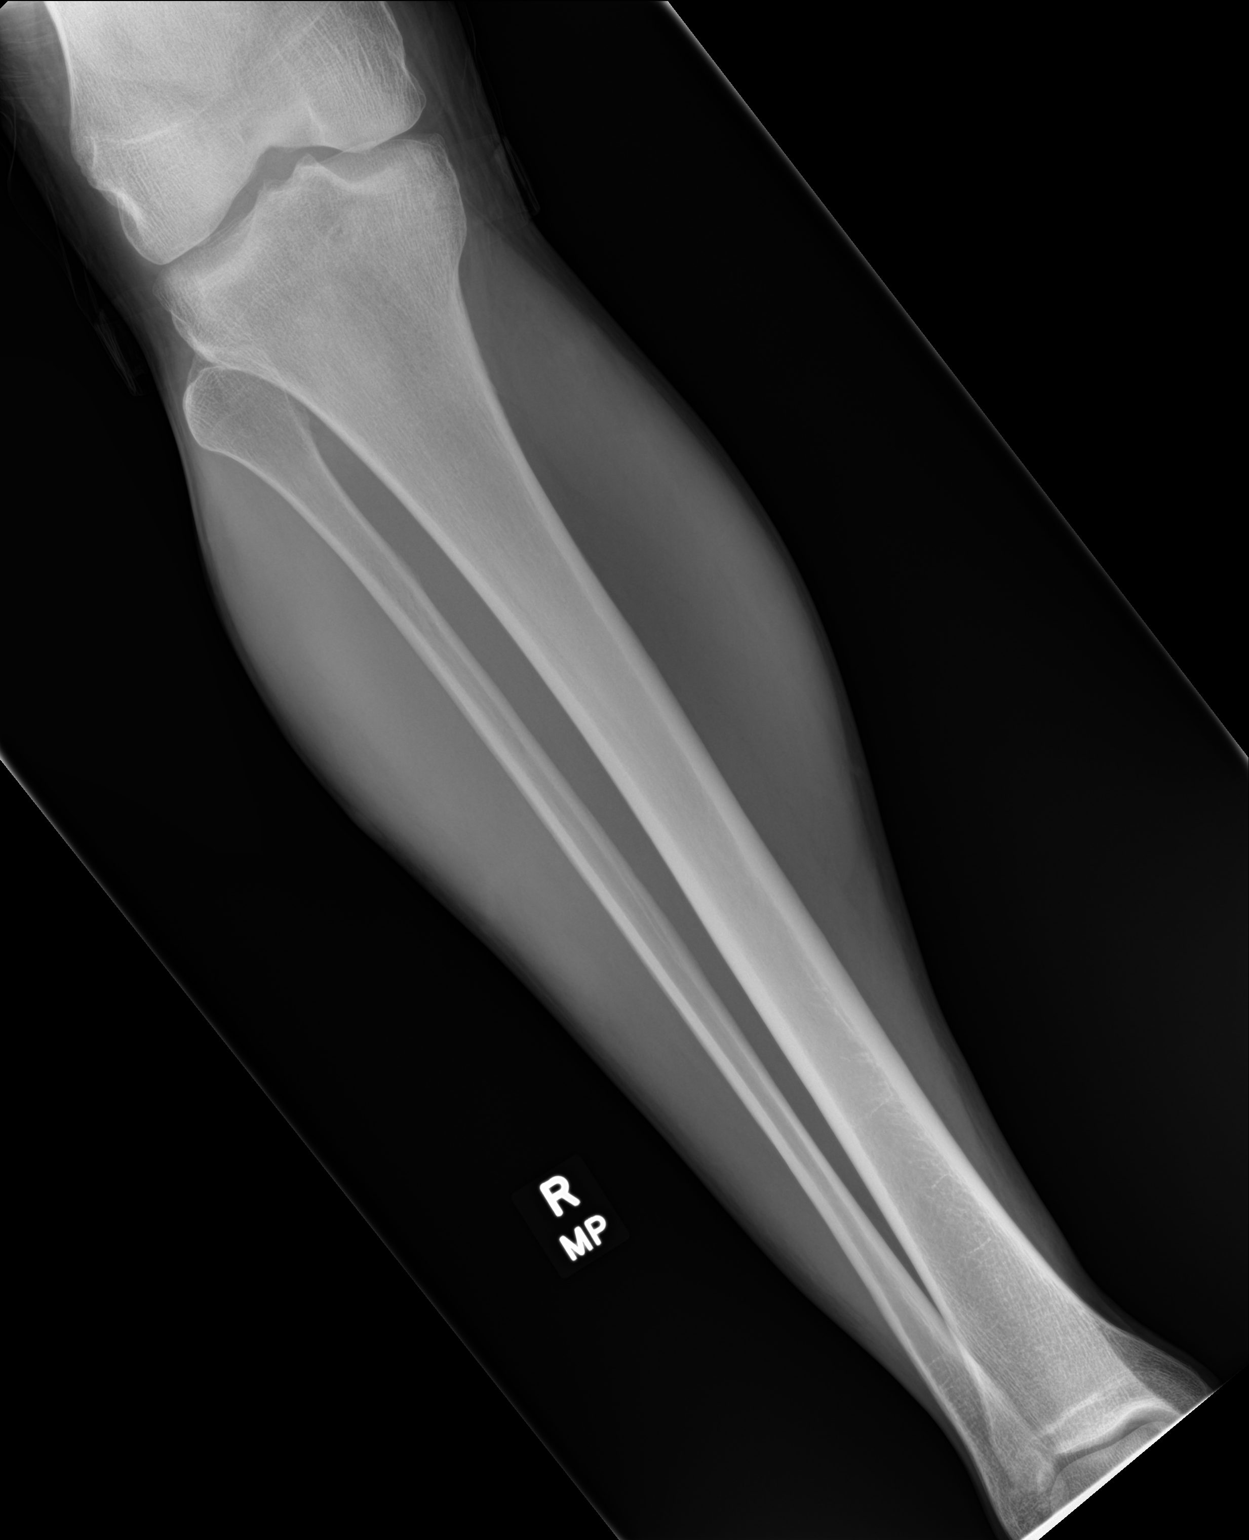

[tibia ap (2 of 2)]
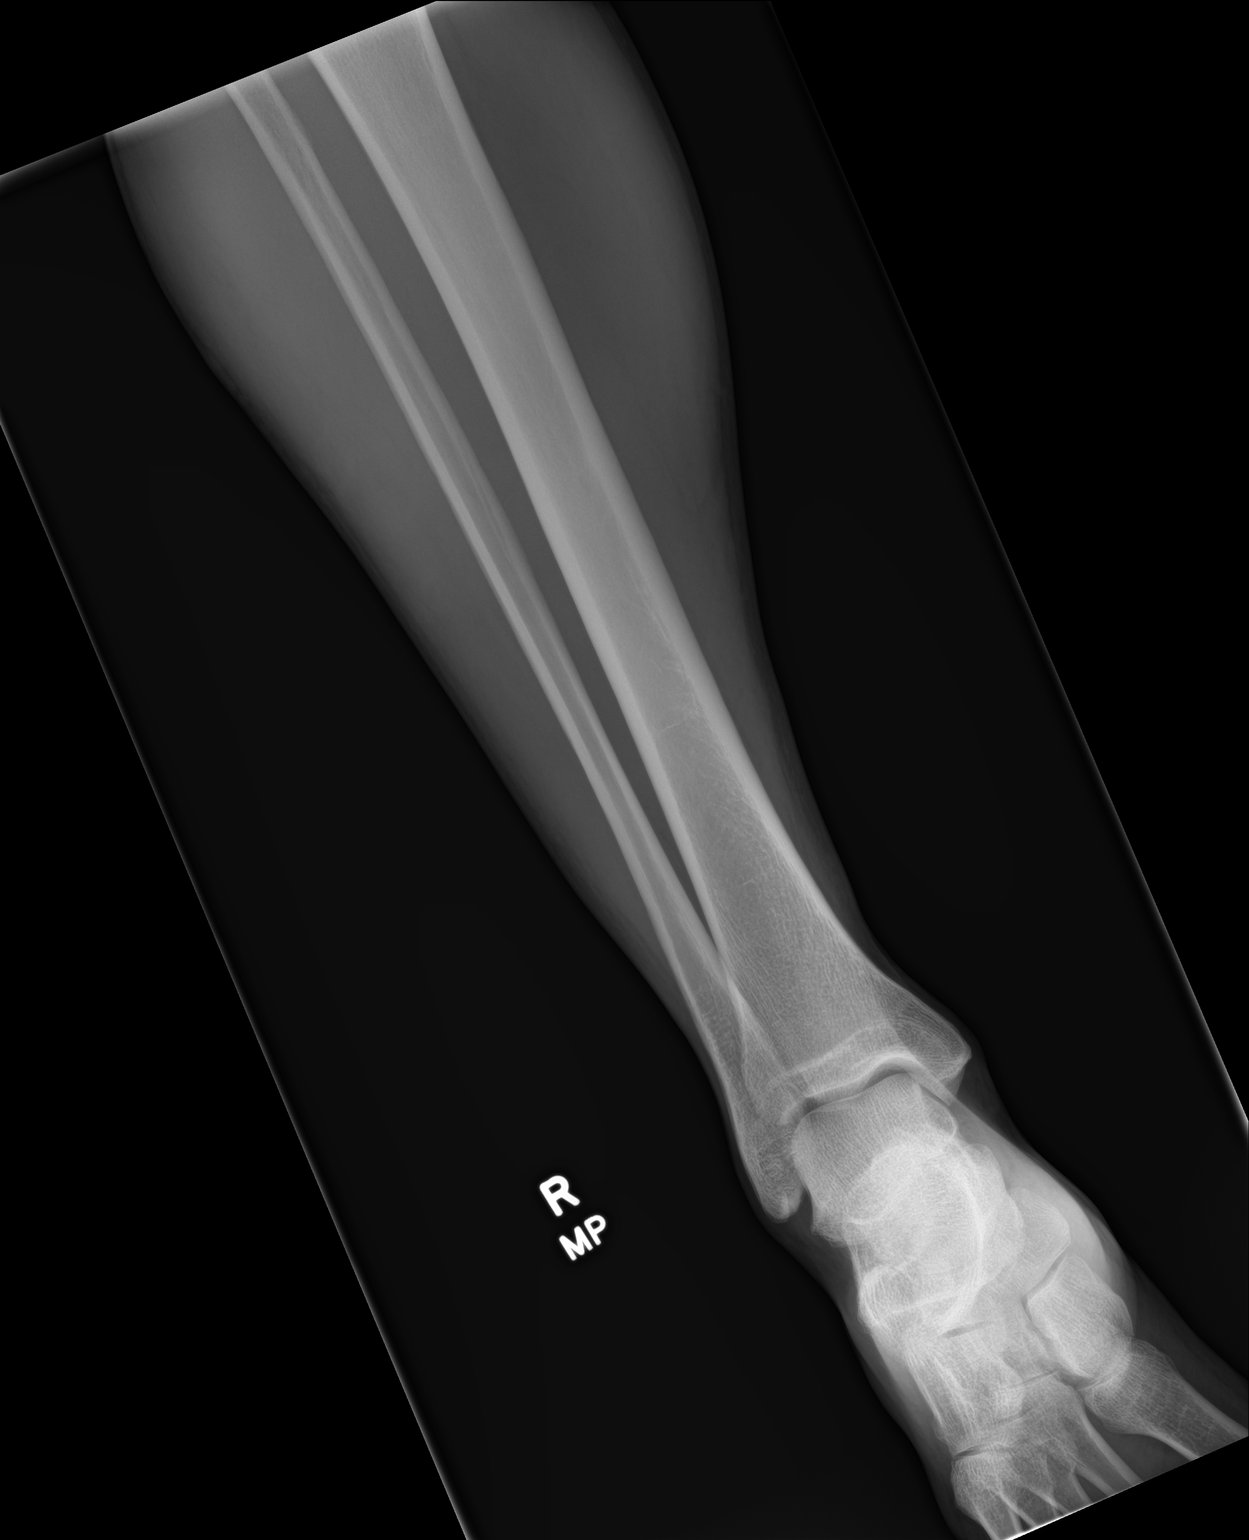

[tibia lat]
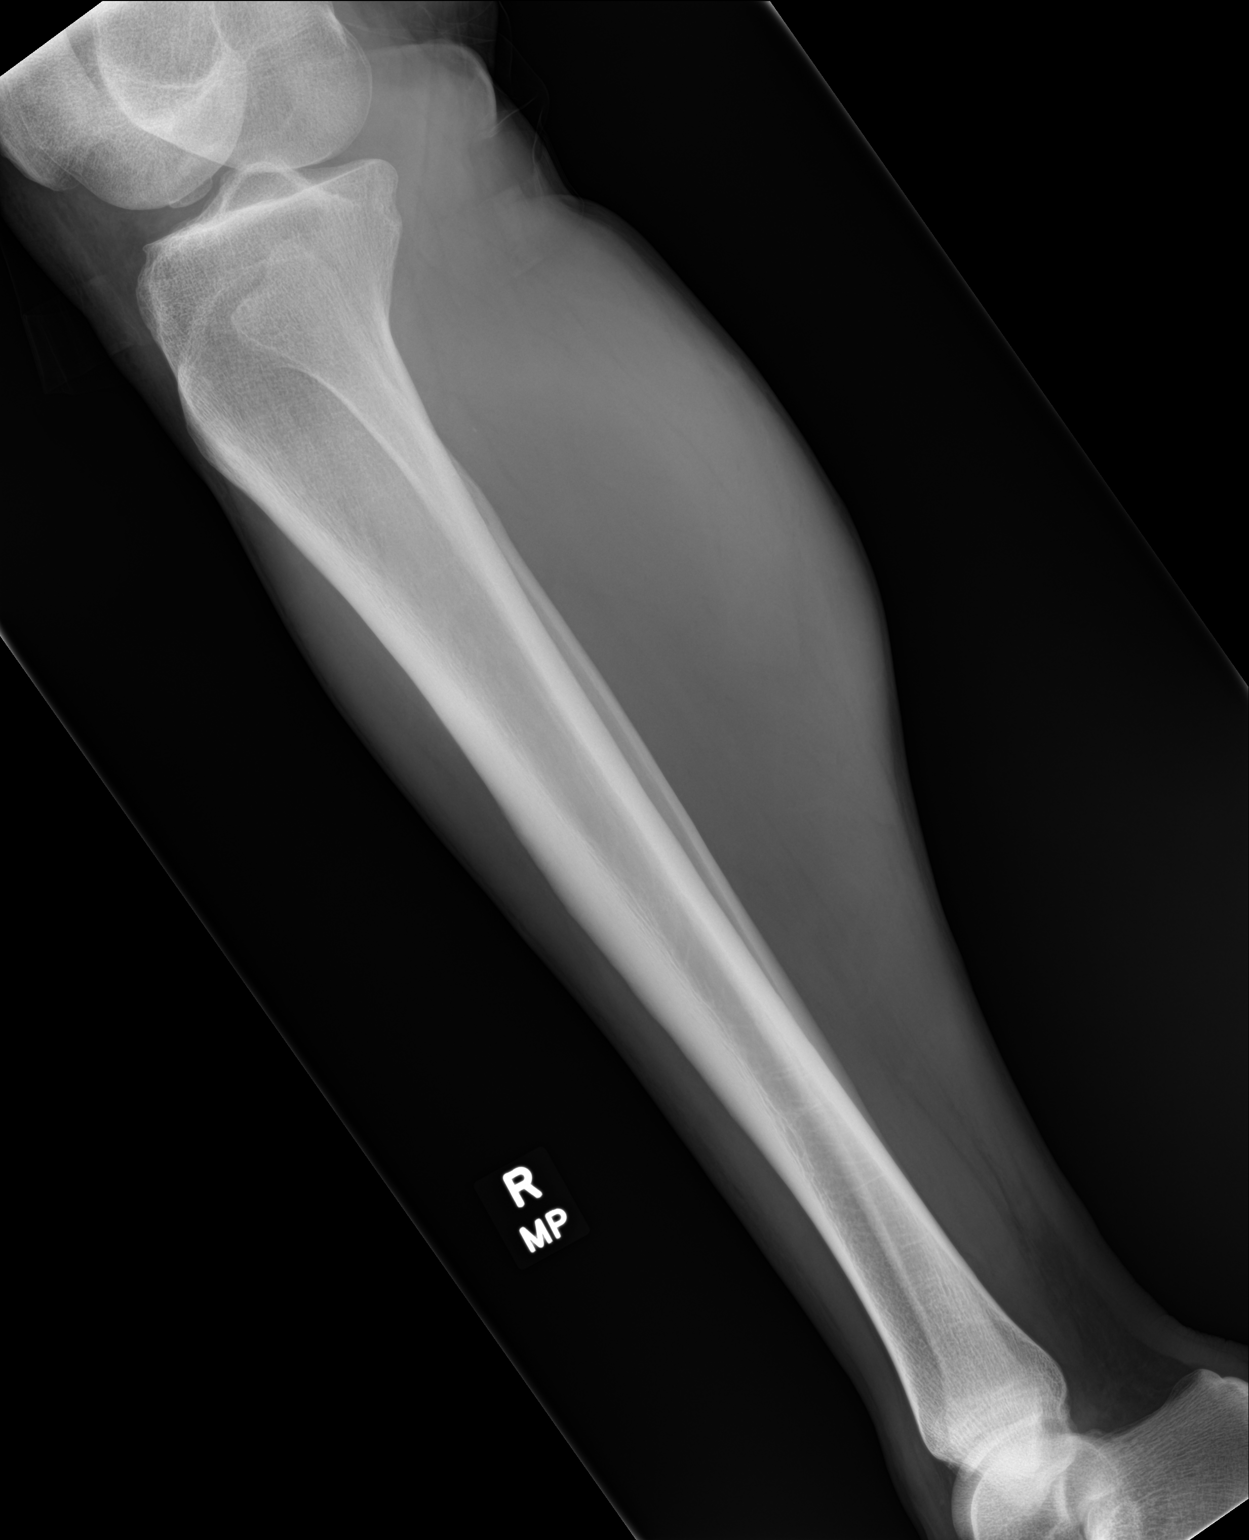

[3 of 3 positions shown; findings below may reference images not displayed]

FINDINGS: Cortical margins of the tibia and fibula are intact. There is no
fracture. Focal bone lesion, periosteal reaction, or bone
destruction. Knee and ankle alignment are maintained. Focal soft
tissue abnormality.
IMPRESSION: Negative radiographs of the right lower leg. No explanation for
pain.

## 2022-10-31 ENCOUNTER — Other Ambulatory Visit: Payer: Self-pay | Admitting: Family Medicine

## 2022-10-31 DIAGNOSIS — F5101 Primary insomnia: Secondary | ICD-10-CM

## 2022-11-08 ENCOUNTER — Encounter: Payer: Self-pay | Admitting: Family Medicine

## 2022-11-17 ENCOUNTER — Ambulatory Visit: Payer: 59 | Admitting: Nurse Practitioner

## 2022-11-17 ENCOUNTER — Ambulatory Visit: Payer: 59 | Admitting: Family Medicine

## 2022-12-07 ENCOUNTER — Encounter: Payer: Self-pay | Admitting: Family Medicine

## 2022-12-07 ENCOUNTER — Ambulatory Visit: Payer: 59 | Admitting: Family Medicine

## 2022-12-07 VITALS — BP 130/68 | HR 71 | Temp 98.8°F | Ht 73.0 in | Wt 214.0 lb

## 2022-12-07 DIAGNOSIS — N539 Unspecified male sexual dysfunction: Secondary | ICD-10-CM

## 2022-12-07 DIAGNOSIS — H6993 Unspecified Eustachian tube disorder, bilateral: Secondary | ICD-10-CM | POA: Diagnosis not present

## 2022-12-07 DIAGNOSIS — F5101 Primary insomnia: Secondary | ICD-10-CM | POA: Diagnosis not present

## 2022-12-07 NOTE — Patient Instructions (Addendum)
Try OTC Unisom for 4 weeks if symptoms do not improve return and will re-evaluate plan.

## 2022-12-07 NOTE — Progress Notes (Signed)
New Patient Office Visit  Subjective   Patient ID: Devin Ryan, male    DOB: Nov 19, 1965  Age: 57 y.o. MRN: [id number]  CC:  Chief Complaint  Patient presents with   Medical Management of Chronic Issues    HPI Devin Ryan presents to establish care Main concern today is insomnia  States that he has been taking restoril for 4 years, but has been without restoril for several weeks.  Has been trying nyquil gummies and has been sleeping through the night with it as well.  Used to work 3rd shift and that's why he needed it. Now works 1st shift.  Cherly Hensen but felt groggy after  Tried Ambien, but would wake up often during the night.   Erectile Dysfunction  Started viagra about a year ago. Not impressed with the results. States that he is looking into more natural ways to improve function, foods rich in nitric oxide, exercise. States that he is having more frequent headaches.   Congestion States that he has had congestion for 4-6 weeks. Has been taking OTC cold medicine at night to help sleep. Associated symptoms include headache. Denies fever, cough. States that he typically has allergies in the summer months. States this is why he has   Outpatient Encounter Medications as of 12/07/2022  Medication Sig   albuterol (VENTOLIN HFA) 108 (90 Base) MCG/ACT inhaler INHALE 1 PUFF INTO THE LUNGS EVERY 6 HOURS AS NEEDED FOR SHORTNESS OF BREATH   cetirizine (ZYRTEC) 10 MG tablet Take 1 tablet (10 mg total) by mouth daily. (Patient taking differently: Take 10 mg by mouth daily as needed for allergies.)   Cholecalciferol (VITAMIN D3) 125 MCG (5000 UT) TABS Take 5,000 Units by mouth daily with breakfast.   ibuprofen (ADVIL,MOTRIN) 200 MG tablet Take 800 mg by mouth every 6 (six) hours as needed for moderate pain.   sildenafil (VIAGRA) 50 MG tablet Take 1-2 tablets (50-100 mg total) by mouth daily as needed for erectile dysfunction.   temazepam (RESTORIL) 30 MG capsule Take 1 capsule (30 mg  total) by mouth at bedtime. (Patient taking differently: Take 30 mg by mouth at bedtime as needed for sleep.)   vitamin E 180 MG (400 UNITS) capsule Take 400 Units by mouth daily.   testosterone cypionate (DEPO-TESTOSTERONE) 100 MG/ML injection Inject 0.5 mLs (50 mg total) into the muscle every 14 (fourteen) days. For IM use only (Patient not taking: Reported on 09/05/2022)   [DISCONTINUED] fenofibrate (TRICOR) 145 MG tablet Take 1 tablet (145 mg total) by mouth daily. (Patient not taking: Reported on 09/05/2022)   [DISCONTINUED] methocarbamol (ROBAXIN) 500 MG tablet Take 1 tablet (500 mg total) by mouth every 8 (eight) hours as needed for muscle spasms. (Patient not taking: Reported on 09/05/2022)   No facility-administered encounter medications on file as of 12/07/2022.    Past Medical History:  Diagnosis Date   Anxiety    Diverticulitis    Eczema 11/30/2018   Headache 11/30/2018   Hyperlipidemia    Low testosterone    Vitamin D insufficiency     Past Surgical History:  Procedure Laterality Date   COLONOSCOPY WITH PROPOFOL N/A 06/20/2022   Procedure: COLONOSCOPY WITH PROPOFOL;  Surgeon: Eloise Harman, DO;  [facility];  Service: Endoscopy;  Laterality: N/A;  8:00am, asa 2   NOSE SURGERY     PARTIAL COLECTOMY N/A 09/28/2015   Procedure: PARTIAL COLECTOMY;  Surgeon: Aviva Signs, MD;  Location: [facility];  Service: General;  Laterality: N/A;  POLYPECTOMY  06/20/2022   Procedure: POLYPECTOMY;  Surgeon: Eloise Harman, DO;  [facility];  Service: Endoscopy;;    Family History  Problem Relation Age of Onset   Diabetes Mother    Heart disease Father        Heart attack    Social History   Socioeconomic History   Marital status: Single    Spouse name: Not on file   Number of children: Not on file   Years of education: Not on file   Highest education level: Not on file  Occupational History   Not on file  Tobacco Use   Smoking status: Every Day     Packs/day: 0.50    Years: 30.00    Total pack years: 15.00    Types: Cigarettes   Smokeless tobacco: Never  Vaping Use   Vaping Use: Never used  Substance and Sexual Activity   Alcohol use: Yes    Comment: occ   Drug use: No   Sexual activity: Yes    Birth control/protection: None  Other Topics Concern   Not on file  Social History Narrative   Not on file   Social Determinants of Health   Financial Resource Strain: Not on file  Food Insecurity: Not on file  Transportation Needs: Not on file  Physical Activity: Not on file  Stress: Not on file  Social Connections: Not on file  Intimate Partner Violence: Not on file    ROS As per HPI   Objective   BP 130/68   Pulse 71   Temp 98.8 F (37.1 C)   Ht '6\' 1"'$  (1.854 m)   Wt 214 lb (97.1 kg)   SpO2 95%   BMI 28.23 kg/m   Physical Exam Constitutional:      General: He is not in acute distress.    Appearance: Normal appearance. He is not ill-appearing, toxic-appearing or diaphoretic.  HENT:     Right Ear: A middle ear effusion is present.     Left Ear: A middle ear effusion is present.     Nose: Congestion and rhinorrhea present.     Mouth/Throat:     Mouth: Mucous membranes are moist.     Pharynx: Oropharynx is clear. No oropharyngeal exudate or posterior oropharyngeal erythema.  Cardiovascular:     Rate and Rhythm: Normal rate.     Pulses: Normal pulses.     Heart sounds: Normal heart sounds. No murmur heard.    No gallop.  Pulmonary:     Effort: Pulmonary effort is normal. No respiratory distress.     Breath sounds: Normal breath sounds. No stridor. No wheezing, rhonchi or rales.  Abdominal:     General: Abdomen is flat. Bowel sounds are normal.     Palpations: Abdomen is soft.  Skin:    General: Skin is warm.     Capillary Refill: Capillary refill takes less than 2 seconds.  Neurological:     General: No focal deficit present.     Mental Status: He is alert and oriented to person, place, and time. Mental  status is at baseline.     Motor: No weakness.  Psychiatric:        Mood and Affect: Mood normal.        Behavior: Behavior normal.        Thought Content: Thought content normal.        Judgment: Judgment normal.    Assessment & Plan:  1. Primary insomnia Pt has been  without restoril for several weeks. Pt has been using OTC treatments with success for past few weeks. Recommended pt continue OTC options (Unisom). Instructed pt to return if symptoms are not controlled. Sleep hygiene recommendations from UpToDate provided.   2. Eustachian tube dysfunction, bilateral Discussed OTC management of flonase and zyrtec. Pt handout provided to patient.   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 Pt is trying natural remedies such as changes in diet and exercise. Discussed other options for patient, pt declined at this time. Will continue to monitor at chronic condition follow up.   The above assessment and management plan was discussed with the patient. The patient verbalized understanding of and has agreed to the management plan using shared-decision making. Patient is aware to call the clinic if they develop any new symptoms or if symptoms fail to improve or worsen. Patient is aware when to return to the clinic for a follow-up visit. Patient educated on when it is appropriate to go to the emergency department.    Donzetta Kohut, DNP-FNP [facility] [address] [place], [place] [address] [phone]

## 2022-12-11 ENCOUNTER — Encounter: Payer: Self-pay | Admitting: Family Medicine

## 2022-12-11 DIAGNOSIS — H6993 Unspecified Eustachian tube disorder, bilateral: Secondary | ICD-10-CM

## 2022-12-12 MED ORDER — FLUTICASONE PROPIONATE 50 MCG/ACT NA SUSP: 2.0000 | Freq: Every day | NASAL | 6 refills | Status: DC

## 2022-12-14 ENCOUNTER — Ambulatory Visit: Payer: 59 | Admitting: Family Medicine

## 2023-01-05 ENCOUNTER — Encounter: Payer: Self-pay | Admitting: Family Medicine

## 2023-01-05 ENCOUNTER — Ambulatory Visit (INDEPENDENT_AMBULATORY_CARE_PROVIDER_SITE_OTHER): Payer: 59 | Admitting: Family Medicine

## 2023-01-05 VITALS — BP 129/70 | HR 73 | Temp 98.5°F | Ht 73.0 in | Wt 211.0 lb

## 2023-01-05 DIAGNOSIS — Z0001 Encounter for general adult medical examination with abnormal findings: Secondary | ICD-10-CM

## 2023-01-05 DIAGNOSIS — H539 Unspecified visual disturbance: Secondary | ICD-10-CM

## 2023-01-05 DIAGNOSIS — Z720 Tobacco use: Secondary | ICD-10-CM

## 2023-01-05 DIAGNOSIS — F5101 Primary insomnia: Secondary | ICD-10-CM

## 2023-01-05 DIAGNOSIS — R7309 Other abnormal glucose: Secondary | ICD-10-CM

## 2023-01-05 DIAGNOSIS — Z136 Encounter for screening for cardiovascular disorders: Secondary | ICD-10-CM

## 2023-01-05 DIAGNOSIS — E782 Mixed hyperlipidemia: Secondary | ICD-10-CM | POA: Diagnosis not present

## 2023-01-05 DIAGNOSIS — Z0000 Encounter for general adult medical examination without abnormal findings: Secondary | ICD-10-CM

## 2023-01-05 DIAGNOSIS — N539 Unspecified male sexual dysfunction: Secondary | ICD-10-CM | POA: Diagnosis not present

## 2023-01-05 DIAGNOSIS — Z122 Encounter for screening for malignant neoplasm of respiratory organs: Secondary | ICD-10-CM

## 2023-01-05 DIAGNOSIS — Z28311 Partially vaccinated for covid-19: Secondary | ICD-10-CM

## 2023-01-05 DIAGNOSIS — E559 Vitamin D deficiency, unspecified: Secondary | ICD-10-CM | POA: Diagnosis not present

## 2023-01-05 NOTE — Progress Notes (Signed)
Devin Ryan is a 57 y.o. male presents to office today for annual physical exam examination.    Concerns today include: 1. Testosterone continues to follow with Endocrinology.   Occupation: Freight forwarder at [employer]  Marital status: not  Diet: Chicken, steak every 1-2 months. Oatmeal, banana, yogurt, tuna, apple, splurges at dinner. States that he was 230 about 5 years ago and would like to be at 190-195.  Exercise: yes, not consistent.  Substance use: etoh, 24 drinks/ year. Tobacco 1 PPD  Last eye exam: [facility] Jearld Shines up   Last dental exam: yes - Dr. Berdine Addison, annually  Last colonoscopy: 2023 Refills needed today: none  Other specialists seen: Endocrine  Has been taking nyquil z gummies for sleep and is working with dreams . Fasting today:  no  Immunizations needed: Flu Vaccine: no  Tdap Vaccine: not due   - every 43yrs - (<3 lifetime doses or unknown): all wounds -- look up need for Tetanus IG - (>=3 lifetime doses): clean/minor wound if >24yrs from previous; all other wounds if >11yrs from previous Zoster Vaccine: declines  (those >50yo, once) Pneumonia Vaccine: no (those w/ risk factors) - (<68yr) Both: Immunocompromised, cochlear implant, CSF leak, asplenic, sickle cell, Chronic Renal Failure - (<28yr) PPSV-23 only: Heart dz, lung disease, DM, tobacco abuse, alcoholism, cirrhosis/liver disease. - (>77yr): PPSV13 then PPSV23 in 6-12mths;  - (>45yr): repeat PPSV23 once if pt received prior to 55yo and 22yrs have passed  Past Medical History:  Diagnosis Date   Anxiety    Diverticulitis    Eczema 11/30/2018   Headache 11/30/2018   Hyperlipidemia    Low testosterone    Vitamin D insufficiency    Social History   Socioeconomic History   Marital status: Single    Spouse name: Not on file   Number of children: Not on file   Years of education: Not on file   Highest education level: Not on file  Occupational History   Not on file  Tobacco Use   Smoking status:  Every Day    Packs/day: 0.50    Years: 30.00    Additional pack years: 0.00    Total pack years: 15.00    Types: Cigarettes   Smokeless tobacco: Never  Vaping Use   Vaping Use: Never used  Substance and Sexual Activity   Alcohol use: Yes    Comment: occ   Drug use: No   Sexual activity: Yes    Birth control/protection: None  Other Topics Concern   Not on file  Social History Narrative   Not on file   Social Determinants of Health   Financial Resource Strain: Not on file  Food Insecurity: Not on file  Transportation Needs: Not on file  Physical Activity: Not on file  Stress: Not on file  Social Connections: Not on file  Intimate Partner Violence: Not on file   Past Surgical History:  Procedure Laterality Date   COLONOSCOPY WITH PROPOFOL N/A 06/20/2022   Procedure: COLONOSCOPY WITH PROPOFOL;  Surgeon: Eloise Harman, DO;  Location: [facility];  Service: Endoscopy;  Laterality: N/A;  8:00am, asa 2   NOSE SURGERY     PARTIAL COLECTOMY N/A 09/28/2015   Procedure: PARTIAL COLECTOMY;  Surgeon: Aviva Signs, MD;  Location: [facility];  Service: General;  Laterality: N/A;   POLYPECTOMY  06/20/2022   Procedure: POLYPECTOMY;  Surgeon: Eloise Harman, DO;  Location: [facility];  Service: Endoscopy;;   Family History  Problem Relation  Age of Onset   Diabetes Mother    Heart disease Father        Heart attack    Current Outpatient Medications:    albuterol (VENTOLIN HFA) 108 (90 Base) MCG/ACT inhaler, INHALE 1 PUFF INTO THE LUNGS EVERY 6 HOURS AS NEEDED FOR SHORTNESS OF BREATH, Disp: 18 g, Rfl: 11   cetirizine (ZYRTEC) 10 MG tablet, Take 1 tablet (10 mg total) by mouth daily. (Patient taking differently: Take 10 mg by mouth daily as needed for allergies.), Disp: 30 tablet, Rfl: 11   Cholecalciferol (VITAMIN D3) 125 MCG (5000 UT) TABS, Take 5,000 Units by mouth daily with breakfast., Disp: , Rfl:    fluticasone (FLONASE) 50 MCG/ACT nasal spray, Place 2 sprays into both  nostrils daily., Disp: 16 g, Rfl: 6   ibuprofen (ADVIL,MOTRIN) 200 MG tablet, Take 800 mg by mouth every 6 (six) hours as needed for moderate pain., Disp: , Rfl:    sildenafil (VIAGRA) 50 MG tablet, Take 1-2 tablets (50-100 mg total) by mouth daily as needed for erectile dysfunction., Disp: 30 tablet, Rfl: 2   temazepam (RESTORIL) 30 MG capsule, Take 1 capsule (30 mg total) by mouth at bedtime. (Patient taking differently: Take 30 mg by mouth at bedtime as needed for sleep.), Disp: 30 capsule, Rfl: 5   testosterone cypionate (DEPO-TESTOSTERONE) 100 MG/ML injection, Inject 0.5 mLs (50 mg total) into the muscle every 14 (fourteen) days. For IM use only, Disp: 10 mL, Rfl: 0   vitamin E 180 MG (400 UNITS) capsule, Take 400 Units by mouth daily., Disp: , Rfl:   No Known Allergies   ROS: Review of Systems Review of Systems  Eyes:  Positive for blurred vision and double vision.    As per HPI   Physical exam    01/05/2023    3:34 PM 12/07/2022    3:37 PM 09/05/2022    3:42 PM  Vitals with BMI  Height 6\' 1"  6\' 1"  6\' 1"   Weight 211 lbs 214 lbs 209 lbs  BMI 27.84 [id number]  Systolic [id number]  Diastolic 70 68 84  Pulse 73 71 56    Physical Exam Constitutional:      Appearance: Normal appearance. He is normal weight.  HENT:     Right Ear: Tympanic membrane normal.     Left Ear: Tympanic membrane normal.     Nose: Nose normal.     Mouth/Throat:     Mouth: Mucous membranes are moist.     Pharynx: Oropharynx is clear. No oropharyngeal exudate or posterior oropharyngeal erythema.  Eyes:     Conjunctiva/sclera: Conjunctivae normal.     Pupils: Pupils are equal, round, and reactive to light.  Cardiovascular:     Rate and Rhythm: Normal rate and regular rhythm.     Pulses: Normal pulses.     Heart sounds: Normal heart sounds.  Abdominal:     General: Abdomen is flat. Bowel sounds are normal.     Palpations: Abdomen is soft.  Genitourinary:    Comments: Deferred by patient   Musculoskeletal:        General: Normal range of motion.     Cervical back: Normal range of motion.  Skin:    General: Skin is warm.     Capillary Refill: Capillary refill takes less than 2 seconds.  Neurological:     General: No focal deficit present.     Mental Status: He is alert and oriented to person, place, and time. Mental status is  at baseline.     Cranial Nerves: No cranial nerve deficit.     Motor: No weakness.     Gait: Gait normal.  Psychiatric:        Mood and Affect: Mood normal.        Behavior: Behavior normal.        Thought Content: Thought content normal.        Judgment: Judgment normal.     Assessment/ Plan: Devin Ryan here for annual physical exam.  1. Routine general medical examination at a health care facility Labs as below. Will communicate results to patient once available.  Discussed with patient to continue healthy lifestyle choices, including diet (rich in fruits, vegetables, and lean proteins, and low in salt and simple carbohydrates) and exercise (at least 30 minutes of moderate physical activity daily). Limit beverages high is sugar. Recommended at least 80-100 oz of water daily.  Pt declined referral to nutrition at this time. States that he has access to information for diet and weight loss on YouTube  - CBC with Differential/Platelet; Future - CMP14+EGFR; Future - PSA; Future  2. Male sexual dysfunction Labs as below. Will communicate results to patient once available.  Previously on testosterone, recently has not been treated due to insurance coverage. Did not feel that it was significantly improving symptoms. Pt has follow up with endocrinology, instructed pt to discuss testosterone replacement with endo.  - Testosterone; Future  3. Vitamin D deficiency Labs as below. Will communicate results to patient once available.  Managed by endo.  - VITAMIN D 25 Hydroxy (Vit-D Deficiency, Fractures); Future  4. Mixed hyperlipidemia Lipid panel  as below. Not currently taking statin. Pt is trying lifestyle modifications for improvement. Discussed with patient Red yeast rice. Will recheck labs next week once fasting.   5. Primary insomnia Pt is using OTC nyquil gummies with success. Discussed with patient repeating sleep study as he did not feel that it was conclusive in the past. Pt declined referral at this time.   6. Tobacco use Continues to smoke cigarettes at this time. Does not express desire to quit at this time.   7. Elevated glucose Labs as below. Will communicate results to patient once available.  - Bayer DCA Hb A1c Waived; Future  8. Partially vaccinated against COVID-19 Labs as below. Will communicate results to patient once available.  - TSH; Future  9. Encounter for screening for cardiovascular disorders Labs as below. Will communicate results to patient once available.  - Lipid panel; Future  10. Screening for lung cancer - CT CHEST LUNG CA SCREEN LOW DOSE W/O CM; Future  11. Changes in vision Pt described recent changes in vision. Last seen by [facility] In 2023. Pt attributes symptoms to Viagra use. States they started after he started taking viagra. Discussed with patient that vision changes may be due to other causes. Offered referral to opthalmology. Pt declined at this time. Instructed pt to follow up if symptoms persist.     Discussed with patient to continue healthy lifestyle choices, including diet (rich in fruits, vegetables, and lean proteins, and low in salt and simple carbohydrates) and exercise (at least 30 minutes of moderate physical activity daily). Limit beverages high is sugar. Recommended at least 80-100 oz of water daily.   Patient to follow up in 1 year for annual exam or sooner if needed.  The above assessment and management plan was discussed with the patient. The patient verbalized understanding of and has agreed to  the management plan. Patient is aware to call the clinic if symptoms  persist or worsen. Patient is aware when to return to the clinic for a follow-up visit. Patient educated on when it is appropriate to go to the emergency department.   Donzetta Kohut, DNP-FNP [facility] [address] [place], [place] [address] [phone]

## 2023-01-05 NOTE — Patient Instructions (Signed)
Health Maintenance, Male Adopting a healthy lifestyle and getting preventive care are important in promoting health and wellness. Ask your health care provider about: The right schedule for you to have regular tests and exams. Things you can do on your own to prevent diseases and keep yourself healthy. What should I know about diet, weight, and exercise? Eat a healthy diet  Eat a diet that includes plenty of vegetables, fruits, low-fat dairy products, and lean protein. Do not eat a lot of foods that are high in solid fats, added sugars, or sodium. Maintain a healthy weight Body mass index (BMI) is a measurement that can be used to identify possible weight problems. It estimates body fat based on height and weight. Your health care provider can help determine your BMI and help you achieve or maintain a healthy weight. Get regular exercise Get regular exercise. This is one of the most important things you can do for your health. Most adults should: Exercise for at least 150 minutes each week. The exercise should increase your heart rate and make you sweat (moderate-intensity exercise). Do strengthening exercises at least twice a week. This is in addition to the moderate-intensity exercise. Spend less time sitting. Even light physical activity can be beneficial. Watch cholesterol and blood lipids Have your blood tested for lipids and cholesterol at 55 years of age, then have this test every 5 years. You may need to have your cholesterol levels checked more often if: Your lipid or cholesterol levels are high. You are older than 55 years of age. You are at high risk for heart disease. What should I know about cancer screening? Many types of cancers can be detected early and may often be prevented. Depending on your health history and family history, you may need to have cancer screening at various ages. This may include screening for: Colorectal cancer. Prostate cancer. Skin cancer. Lung  cancer. What should I know about heart disease, diabetes, and high blood pressure? Blood pressure and heart disease High blood pressure causes heart disease and increases the risk of stroke. This is more likely to develop in people who have high blood pressure readings or are overweight. Talk with your health care provider about your target blood pressure readings. Have your blood pressure checked: Every 3-5 years if you are 18-39 years of age. Every year if you are 40 years old or older. If you are between the ages of 65 and 75 and are a current or former smoker, ask your health care provider if you should have a one-time screening for abdominal aortic aneurysm (AAA). Diabetes Have regular diabetes screenings. This checks your fasting blood sugar level. Have the screening done: Once every three years after age 45 if you are at a normal weight and have a low risk for diabetes. More often and at a younger age if you are overweight or have a high risk for diabetes. What should I know about preventing infection? Hepatitis B If you have a higher risk for hepatitis B, you should be screened for this virus. Talk with your health care provider to find out if you are at risk for hepatitis B infection. Hepatitis C Blood testing is recommended for: Everyone born from 1945 through 1965. Anyone with known risk factors for hepatitis C. Sexually transmitted infections (STIs) You should be screened each year for STIs, including gonorrhea and chlamydia, if: You are sexually active and are younger than 55 years of age. You are older than 55 years of age and your   health care provider tells you that you are at risk for this type of infection. Your sexual activity has changed since you were last screened, and you are at increased risk for chlamydia or gonorrhea. Ask your health care provider if you are at risk. Ask your health care provider about whether you are at high risk for HIV. Your health care provider  may recommend a prescription medicine to help prevent HIV infection. If you choose to take medicine to prevent HIV, you should first get tested for HIV. You should then be tested every 3 months for as long as you are taking the medicine. Follow these instructions at home: Alcohol use Do not drink alcohol if your health care provider tells you not to drink. If you drink alcohol: Limit how much you have to 0-2 drinks a day. Know how much alcohol is in your drink. In the [place] one drink equals one 12 oz bottle of beer (355 mL), one 5 oz glass of wine (148 mL), or one 1 oz glass of hard liquor (44 mL). Lifestyle Do not use any products that contain nicotine or tobacco. These products include cigarettes, chewing tobacco, and vaping devices, such as e-cigarettes. If you need help quitting, ask your health care provider. Do not use street drugs. Do not share needles. Ask your health care provider for help if you need support or information about quitting drugs. General instructions Schedule regular health, dental, and eye exams. Stay current with your vaccines. Tell your health care provider if: You often feel depressed. You have ever been abused or do not feel safe at home. Summary Adopting a healthy lifestyle and getting preventive care are important in promoting health and wellness. Follow your health care provider's instructions about healthy diet, exercising, and getting tested or screened for diseases. Follow your health care provider's instructions on monitoring your cholesterol and blood pressure. This information is not intended to replace advice given to you by your health care provider. Make sure you discuss any questions you have with your health care provider. Document Revised: 02/15/2021 Document Reviewed: 02/15/2021 Elsevier Patient Education  2023 [employer]  

## 2023-01-11 ENCOUNTER — Other Ambulatory Visit: Payer: 59

## 2023-01-11 DIAGNOSIS — Z136 Encounter for screening for cardiovascular disorders: Secondary | ICD-10-CM

## 2023-01-11 DIAGNOSIS — N539 Unspecified male sexual dysfunction: Secondary | ICD-10-CM

## 2023-01-11 DIAGNOSIS — Z28311 Partially vaccinated for covid-19: Secondary | ICD-10-CM

## 2023-01-11 DIAGNOSIS — Z0000 Encounter for general adult medical examination without abnormal findings: Secondary | ICD-10-CM

## 2023-01-11 DIAGNOSIS — E559 Vitamin D deficiency, unspecified: Secondary | ICD-10-CM

## 2023-01-11 DIAGNOSIS — R7309 Other abnormal glucose: Secondary | ICD-10-CM

## 2023-01-11 LAB — BAYER DCA HB A1C WAIVED: HB A1C (BAYER DCA - WAIVED): 5.3 % (ref 4.8–5.6)

## 2023-01-12 LAB — CBC WITH DIFFERENTIAL/PLATELET
Basophils Absolute: 0.1 10*3/uL (ref 0.0–0.2)
Basos: 1 %
EOS (ABSOLUTE): 0.4 10*3/uL (ref 0.0–0.4)
Eos: 6 %
Hematocrit: 49.2 % (ref 37.5–51.0)
Hemoglobin: 16.9 g/dL (ref 13.0–17.7)
Immature Grans (Abs): 0 10*3/uL (ref 0.0–0.1)
Immature Granulocytes: 0 %
Lymphocytes Absolute: 2.2 10*3/uL (ref 0.7–3.1)
Lymphs: 28 %
MCH: 31.7 pg (ref 26.6–33.0)
MCHC: 34.3 g/dL (ref 31.5–35.7)
MCV: 92 fL (ref 79–97)
Monocytes Absolute: 0.7 10*3/uL (ref 0.1–0.9)
Monocytes: 10 %
Neutrophils Absolute: 4.4 10*3/uL (ref 1.4–7.0)
Neutrophils: 55 %
Platelets: 271 10*3/uL (ref 150–450)
RBC: 5.33 x10E6/uL (ref 4.14–5.80)
RDW: 12.4 % (ref 11.6–15.4)
WBC: 7.8 10*3/uL (ref 3.4–10.8)

## 2023-01-12 LAB — CMP14+EGFR
ALT: 26 IU/L (ref 0–44)
AST: 19 IU/L (ref 0–40)
Albumin/Globulin Ratio: 2.1 (ref 1.2–2.2)
Albumin: 4.9 g/dL (ref 3.8–4.9)
Alkaline Phosphatase: 69 IU/L (ref 44–121)
BUN/Creatinine Ratio: 14 (ref 9–20)
BUN: 12 mg/dL (ref 6–24)
Bilirubin Total: 0.7 mg/dL (ref 0.0–1.2)
CO2: 22 mmol/L (ref 20–29)
Calcium: 9.8 mg/dL (ref 8.7–10.2)
Chloride: 103 mmol/L (ref 96–106)
Creatinine, Ser: 0.85 mg/dL (ref 0.76–1.27)
Globulin, Total: 2.3 g/dL (ref 1.5–4.5)
Glucose: 101 mg/dL — ABNORMAL HIGH (ref 70–99)
Potassium: 4.9 mmol/L (ref 3.5–5.2)
Sodium: 141 mmol/L (ref 134–144)
Total Protein: 7.2 g/dL (ref 6.0–8.5)
eGFR: 103 mL/min/{1.73_m2} (ref >59)

## 2023-01-12 LAB — TSH: TSH: 1.82 u[IU]/mL (ref 0.450–4.500)

## 2023-01-12 LAB — LIPID PANEL
Chol/HDL Ratio: 5.9 ratio — ABNORMAL HIGH (ref 0.0–5.0)
Cholesterol, Total: 182 mg/dL (ref 100–199)
HDL: 31 mg/dL — ABNORMAL LOW (ref >39)
LDL Chol Calc (NIH): 110 mg/dL — ABNORMAL HIGH (ref 0–99)
Triglycerides: 234 mg/dL — ABNORMAL HIGH (ref 0–149)
VLDL Cholesterol Cal: 41 mg/dL — ABNORMAL HIGH (ref 5–40)

## 2023-01-12 LAB — PSA: Prostate Specific Ag, Serum: 0.7 ng/mL (ref 0.0–4.0)

## 2023-01-12 LAB — TESTOSTERONE: Testosterone: 400 ng/dL (ref 264–916)

## 2023-01-12 LAB — VITAMIN D 25 HYDROXY (VIT D DEFICIENCY, FRACTURES): Vit D, 25-Hydroxy: 58 ng/mL (ref 30.0–100.0)

## 2023-01-15 ENCOUNTER — Encounter: Payer: Self-pay | Admitting: Family Medicine

## 2023-03-01 ENCOUNTER — Other Ambulatory Visit: Payer: 59

## 2023-03-02 LAB — LIPID PANEL
Chol/HDL Ratio: 5.2 ratio — ABNORMAL HIGH (ref 0.0–5.0)
Cholesterol, Total: 194 mg/dL (ref 100–199)
HDL: 37 mg/dL — ABNORMAL LOW (ref >39)
LDL Chol Calc (NIH): 121 mg/dL — ABNORMAL HIGH (ref 0–99)
Triglycerides: 205 mg/dL — ABNORMAL HIGH (ref 0–149)
VLDL Cholesterol Cal: 36 mg/dL (ref 5–40)

## 2023-03-02 LAB — VITAMIN D 25 HYDROXY (VIT D DEFICIENCY, FRACTURES): Vit D, 25-Hydroxy: 45.8 ng/mL (ref 30.0–100.0)

## 2023-03-07 ENCOUNTER — Encounter: Payer: Self-pay | Admitting: "Endocrinology

## 2023-03-07 ENCOUNTER — Ambulatory Visit: Payer: 59 | Admitting: "Endocrinology

## 2023-03-07 VITALS — BP 122/78 | HR 80 | Ht 73.0 in | Wt 208.6 lb

## 2023-03-07 DIAGNOSIS — E782 Mixed hyperlipidemia: Secondary | ICD-10-CM | POA: Diagnosis not present

## 2023-03-07 DIAGNOSIS — E559 Vitamin D deficiency, unspecified: Secondary | ICD-10-CM

## 2023-03-07 MED ORDER — ROSUVASTATIN CALCIUM 5 MG PO TABS: 5.0000 mg | ORAL_TABLET | Freq: Every day | ORAL | 1 refills | Status: DC

## 2023-03-07 NOTE — Patient Instructions (Signed)

## 2023-03-07 NOTE — Progress Notes (Addendum)
03/07/2023, 6:51 PM  Endocrinology follow-up note   Subjective:    Patient ID: Devin Ryan, male    DOB: 07/22/1966, PCP Arrie Senate, FNP   Past Medical History:  Diagnosis Date   Anxiety    Diverticulitis    Eczema 11/30/2018   Headache 11/30/2018   Hyperlipidemia    Low testosterone    Vitamin D insufficiency    Past Surgical History:  Procedure Laterality Date   COLONOSCOPY WITH PROPOFOL N/A 06/20/2022   Procedure: COLONOSCOPY WITH PROPOFOL;  Surgeon: Lanelle Bal, DO;  [facility];  Service: Endoscopy;  Laterality: N/A;  8:00am, asa 2   NOSE SURGERY     PARTIAL COLECTOMY N/A 09/28/2015   Procedure: PARTIAL COLECTOMY;  Surgeon: Franky Macho, MD;  Location: [facility];  Service: General;  Laterality: N/A;   POLYPECTOMY  06/20/2022   Procedure: POLYPECTOMY;  Surgeon: Lanelle Bal, DO;  [facility];  Service: Endoscopy;;   Social History   Socioeconomic History   Marital status: Single    Spouse name: Not on file   Number of children: Not on file   Years of education: Not on file   Highest education level: Not on file  Occupational History   Not on file  Tobacco Use   Smoking status: Every Day    Packs/day: 0.50    Years: 30.00    Additional pack years: 0.00    Total pack years: 15.00    Types: Cigarettes   Smokeless tobacco: Never  Vaping Use   Vaping Use: Never used  Substance and Sexual Activity   Alcohol use: Yes    Comment: occ   Drug use: No   Sexual activity: Yes    Birth control/protection: None  Other Topics Concern   Not on file  Social History Narrative   Not on file   Social Determinants of Health   Financial Resource Strain: Not on file  Food Insecurity: Not on file  Transportation Needs: Not on file  Physical Activity: Not on file  Stress: Not on file  Social Connections: Not on file   Family History  Problem Relation Age of Onset    Diabetes Mother    Heart disease Father        Heart attack   Outpatient Encounter Medications as of 03/07/2023  Medication Sig   Red Yeast Rice Extract (RED YEAST RICE PO) Take by mouth daily.   rosuvastatin (CRESTOR) 5 MG tablet Take 1 tablet (5 mg total) by mouth daily.   Zinc 50 MG TABS Take 1 tablet by mouth daily.   albuterol (VENTOLIN HFA) 108 (90 Base) MCG/ACT inhaler INHALE 1 PUFF INTO THE LUNGS EVERY 6 HOURS AS NEEDED FOR SHORTNESS OF BREATH   cetirizine (ZYRTEC) 10 MG tablet Take 1 tablet (10 mg total) by mouth daily. (Patient taking differently: Take 10 mg by mouth daily as needed for allergies.)   Cholecalciferol (VITAMIN D3) 125 MCG (5000 UT) TABS Take 5,000 Units by mouth daily with breakfast.   fluticasone (FLONASE) 50 MCG/ACT nasal spray Place 2 sprays into both nostrils daily.   ibuprofen (ADVIL,MOTRIN) 200 MG tablet Take 800 mg by mouth every 6 (six) hours as needed for  moderate pain.   sildenafil (VIAGRA) 50 MG tablet Take 1-2 tablets (50-100 mg total) by mouth daily as needed for erectile dysfunction.   temazepam (RESTORIL) 30 MG capsule Take 1 capsule (30 mg total) by mouth at bedtime. (Patient not taking: Reported on 03/07/2023)   testosterone cypionate (DEPO-TESTOSTERONE) 100 MG/ML injection Inject 0.5 mLs (50 mg total) into the muscle every 14 (fourteen) days. For IM use only (Patient not taking: Reported on 03/07/2023)   vitamin E 180 MG (400 UNITS) capsule Take 400 Units by mouth daily.   No facility-administered encounter medications on file as of 03/07/2023.   ALLERGIES: No Known Allergies  VACCINATION STATUS: Immunization History  Administered Date(s) Administered   Tdap 04/18/2022    HPI Devin Ryan is 57 y.o. male who presents today with a medical history as above. he is being seen in follow-up after he was seen in consultation for vitamin D deficiency . His previsit labs show improved vitamin D at  45, slowly improving from 27.9.  He has finished his  weekly supplies of high-dose vitamin D, for the last several weeks remained only on vitamin D3 10,000 units daily.   He denies any prior history of GI surgery, osteoporosis, fractures.  He spends most of his time indoors.  His daily intake is average.  He is other medical problems include hyperlipidemia with uncontrolled high LDL of 121, increasing progressively.  He is not on statins.  He did not engage optimally to lifestyle medicine.  Review of Systems  Constitutional: no recent weight gain/loss, no fatigue, no subjective hyperthermia, no subjective hypothermia Eyes: no blurry vision, no xerophthalmia ENT: no sore throat, no nodules palpated in throat, no dysphagia/odynophagia, no hoarseness   Objective:       03/07/2023    3:48 PM 01/05/2023    3:34 PM 12/07/2022    3:37 PM  Vitals with BMI  Height 6\' 1"  6\' 1"  6\' 1"   Weight 208 lbs 10 oz 211 lbs 214 lbs  BMI 27.53 27.84 28.24  Systolic 122 129 161  Diastolic 78 70 68  Pulse 80 73 71    BP 122/78   Pulse 80   Ht 6\' 1"  (1.854 m)   Wt 208 lb 9.6 oz (94.6 kg)   BMI 27.52 kg/m   Wt Readings from Last 3 Encounters:  03/07/23 208 lb 9.6 oz (94.6 kg)  01/05/23 211 lb (95.7 kg)  12/07/22 214 lb (97.1 kg)    Physical Exam  Constitutional:  Body mass index is 27.52 kg/m.,  not in acute distress, normal state of mind Eyes: PERRLA, EOMI, no exophthalmos   CMP ( most recent) CMP     Component Value Date/Time   NA 141 01/11/2023 0955   K 4.9 01/11/2023 0955   CL 103 01/11/2023 0955   CO2 22 01/11/2023 0955   GLUCOSE 101 (H) 01/11/2023 0955   GLUCOSE 93 09/30/2015 0546   BUN 12 01/11/2023 0955   CREATININE 0.85 01/11/2023 0955   CALCIUM 9.8 01/11/2023 0955   PROT 7.2 01/11/2023 0955   ALBUMIN 4.9 01/11/2023 0955   AST 19 01/11/2023 0955   ALT 26 01/11/2023 0955   ALKPHOS 69 01/11/2023 0955   BILITOT 0.7 01/11/2023 0955   GFRNONAA 92 07/31/2020 1505   GFRAA 106 07/31/2020 1505     Diabetic Labs (most  recent): Lab Results  Component Value Date   HGBA1C 5.3 01/11/2023   HGBA1C 5.3 03/04/2021     Lipid Panel ( most recent) Lipid Panel  Component Value Date/Time   CHOL 194 03/01/2023 0812   TRIG 205 (H) 03/01/2023 0812   HDL 37 (L) 03/01/2023 0812   CHOLHDL 5.2 (H) 03/01/2023 0812   LDLCALC 121 (H) 03/01/2023 0812   LABVLDL 36 03/01/2023 0812      Lab Results  Component Value Date   TSH 1.820 01/11/2023   TSH 1.460 04/08/2022   TSH 2.980 10/24/2017           Assessment & Plan:   1. Vitamin D deficiency 2. Mixed hyperlipidemia -His vitamin D deficiency is now corrected.  He is advised to continue vitamin D3 5000 units daily until next measurement.  In light of his severe hyperlipidemia, and from the point of view of preventive medicine, he is approached for low-dose statin medications and he accepts.  I discussed and prescribed Crestor 5 mg p.o. nightly.  Side effects and precautions discussed with him.    He is still a good candidate for lifestyle medicine.     - he acknowledges that there is a room for improvement in his food and drink choices. - Suggestion is made for him to avoid simple carbohydrates  from his diet including Cakes, Sweet Desserts, Ice Cream, Soda (diet and regular), Sweet Tea, Candies, Chips, Cookies, Store Bought Juices, Alcohol , Artificial Sweeteners,  Coffee Creamer, and "Sugar-free" Products, Lemonade. This will help patient to have more stable blood glucose profile and potentially avoid unintended weight gain.  The following Lifestyle Medicine recommendations according to American College of Lifestyle Medicine  [facility]) were discussed and and offered to patient and he  agrees to start the journey:  A. Whole Foods, Plant-Based Nutrition comprising of fruits and vegetables, plant-based proteins, whole-grain carbohydrates was discussed in detail with the patient.   A list for source of those nutrients were also provided to the patient.  Patient  will use only water or unsweetened tea for hydration. B.  The need to stay away from risky substances including alcohol, smoking; obtaining 7 to 9 hours of restorative sleep, at least 150 minutes of moderate intensity exercise weekly, the importance of healthy social connections,  and stress management techniques were discussed. C.  A full color page of  Calorie density of various food groups per pound showing examples of each food groups was provided to the patient. He will have fasting lipid panel along with total testosterone before his next visit in 4 months. - he is advised to maintain close follow up with his PCP for primary care needs.  I spent  22  minutes in the care of the patient today including review of labs from Thyroid Function, CMP, and other relevant labs ; imaging/biopsy records (current and previous including abstractions from other facilities); face-to-face time discussing  his lab results and symptoms, medications doses, his options of short and long term treatment based on the latest standards of care / guidelines;   and documenting the encounter.  Lorin Mercy  participated in the discussions, expressed understanding, and voiced agreement with the above plans.  All questions were answered to his satisfaction. he is encouraged to contact clinic should he have any questions or concerns prior to his return visit.    Follow up plan: Return in about 4 months (around 07/08/2023) for Fasting Labs  in AM B4 8.   Marquis Lunch, MD [facility] [address] [place], [place] [address] Phone: [phone]  Fax: [phone]     03/07/2023, 6:51 PM  This  note was partially dictated with voice recognition software. Similar sounding words can be transcribed inadequately or may not  be corrected upon review.

## 2023-04-08 ENCOUNTER — Other Ambulatory Visit: Payer: Self-pay | Admitting: Family Medicine

## 2023-04-08 DIAGNOSIS — N539 Unspecified male sexual dysfunction: Secondary | ICD-10-CM

## 2023-05-22 ENCOUNTER — Encounter: Payer: Self-pay | Admitting: Emergency Medicine

## 2023-05-29 DIAGNOSIS — M531 Cervicobrachial syndrome: Secondary | ICD-10-CM | POA: Diagnosis not present

## 2023-05-29 DIAGNOSIS — M9908 Segmental and somatic dysfunction of rib cage: Secondary | ICD-10-CM | POA: Diagnosis not present

## 2023-05-29 DIAGNOSIS — M9901 Segmental and somatic dysfunction of cervical region: Secondary | ICD-10-CM | POA: Diagnosis not present

## 2023-05-29 DIAGNOSIS — M9902 Segmental and somatic dysfunction of thoracic region: Secondary | ICD-10-CM | POA: Diagnosis not present

## 2023-06-20 ENCOUNTER — Ambulatory Visit (INDEPENDENT_AMBULATORY_CARE_PROVIDER_SITE_OTHER): Payer: BC Managed Care – PPO | Admitting: Family Medicine

## 2023-06-20 ENCOUNTER — Encounter: Payer: Self-pay | Admitting: Family Medicine

## 2023-06-20 VITALS — BP 104/66 | Temp 98.7°F | Ht 73.0 in | Wt 192.0 lb

## 2023-06-20 DIAGNOSIS — G8929 Other chronic pain: Secondary | ICD-10-CM | POA: Diagnosis not present

## 2023-06-20 DIAGNOSIS — M542 Cervicalgia: Secondary | ICD-10-CM

## 2023-06-20 DIAGNOSIS — M25511 Pain in right shoulder: Secondary | ICD-10-CM | POA: Diagnosis not present

## 2023-06-20 MED ORDER — PREDNISONE 20 MG PO TABS
40.0000 mg | ORAL_TABLET | Freq: Every day | ORAL | 0 refills | Status: AC
Start: 2023-06-20 — End: 2023-06-25

## 2023-06-20 NOTE — Progress Notes (Signed)
Subjective:  Patient ID: Devin Ryan, male    DOB: [birth year], 57 y.o.   MRN: [id number]  Patient Care Team: Arrie Senate, FNP as PCP - General (Family Medicine)   Chief Complaint:  neck/shoulder discomfort  HPI: Devin Ryan is a 57 y.o. male presenting on 06/20/2023 for neck/shoulder discomfort Reports that pain is in his right shoulder/neck.  States it started in 2019. He has been following with two different chiropractors. States that he started seeing the first one in 2010 when he had issues with his lower back. Lower back has improved, now has issues with his neck/shoulder. Believes that it started when he was doing a lot of repetitive movements with his yardwork. States that he is a side sleeper and it is more painful in the mornings. Second chiropractor told him he had scar tissue and knots. Received scraping. Also tried acupuncture twice, but did not find a difference. In addition, tried dry needling, which did not help. Has also tried massage, ice, ibuprofen, heat, and tens unit. Takes one dose of ibuprofen (~600 mg) 2-3 days per week.  States that ice helps some. Other methods help very little.  States that symptoms are about the same, is not getting any better. Would like to be pain free for the New Year so that he can be more active.    Relevant past medical, surgical, family, and social history reviewed and updated as indicated.  Allergies and medications reviewed and updated. Data reviewed: Chart in Epic.  Past Medical History:  Diagnosis Date   Anxiety    Diverticulitis    Eczema 11/30/2018   Headache 11/30/2018   Hyperlipidemia    Low testosterone    Vitamin D insufficiency    Past Surgical History:  Procedure Laterality Date   COLONOSCOPY WITH PROPOFOL N/A 06/20/2022   Procedure: COLONOSCOPY WITH PROPOFOL;  Surgeon: Lanelle Bal, DO;  [facility];  Service: Endoscopy;  Laterality: N/A;  8:00am, asa 2   NOSE SURGERY     PARTIAL  COLECTOMY N/A 09/28/2015   Procedure: PARTIAL COLECTOMY;  Surgeon: Franky Macho, MD;  Location: [facility];  Service: General;  Laterality: N/A;   POLYPECTOMY  06/20/2022   Procedure: POLYPECTOMY;  Surgeon: Lanelle Bal, DO;  [facility];  Service: Endoscopy;;   Social History   Socioeconomic History   Marital status: Single    Spouse name: Not on file   Number of children: Not on file   Years of education: Not on file   Highest education level: 12th grade  Occupational History   Not on file  Tobacco Use   Smoking status: Every Day    Current packs/day: 0.50    Average packs/day: 0.5 packs/day for 30.0 years (15.0 ttl pk-yrs)    Types: Cigarettes   Smokeless tobacco: Never  Vaping Use   Vaping status: Never Used  Substance and Sexual Activity   Alcohol use: Yes    Comment: occ   Drug use: No   Sexual activity: Yes    Birth control/protection: None  Other Topics Concern   Not on file  Social History Narrative   Not on file   Social Determinants of Health   Financial Resource Strain: Low Risk  (06/19/2023)   Overall Financial Resource Strain (CARDIA)    Difficulty of Paying Living Expenses: Not hard at all  Food Insecurity: No Food Insecurity (06/19/2023)   Hunger Vital Sign    Worried About Running Out of  Food in the Last Year: Never true    Ran Out of Food in the Last Year: Never true  Transportation Needs: No Transportation Needs (06/19/2023)   PRAPARE - Administrator, Civil Service (Medical): No    Lack of Transportation (Non-Medical): No  Physical Activity: Insufficiently Active (06/19/2023)   Exercise Vital Sign    Days of Exercise per Week: 3 days    Minutes of Exercise per Session: 30 min  Stress: No Stress Concern Present (06/19/2023)   [employer] of Occupational Health - Occupational Stress Questionnaire    Feeling of Stress : Not at all  Social Connections: Socially Isolated (06/19/2023)   Social Connection and Isolation Panel  [NHANES]    Frequency of Communication with Friends and Family: Once a week    Frequency of Social Gatherings with Friends and Family: Once a week    Attends Religious Services: More than 4 times per year    Active Member of [employer] or Organizations: No    Attends Banker Meetings: Not on file    Marital Status: Separated  Intimate Partner Violence: Not on file    Outpatient Encounter Medications as of 06/20/2023  Medication Sig   albuterol (VENTOLIN HFA) 108 (90 Base) MCG/ACT inhaler INHALE 1 PUFF INTO THE LUNGS EVERY 6 HOURS AS NEEDED FOR SHORTNESS OF BREATH   cetirizine (ZYRTEC) 10 MG tablet Take 1 tablet (10 mg total) by mouth daily. (Patient taking differently: Take 10 mg by mouth daily as needed for allergies.)   Cholecalciferol (VITAMIN D3) 125 MCG (5000 UT) TABS Take 5,000 Units by mouth daily with breakfast.   fluticasone (FLONASE) 50 MCG/ACT nasal spray Place 2 sprays into both nostrils daily.   ibuprofen (ADVIL,MOTRIN) 200 MG tablet Take 800 mg by mouth every 6 (six) hours as needed for moderate pain.   Red Yeast Rice Extract (RED YEAST RICE PO) Take by mouth daily.   rosuvastatin (CRESTOR) 5 MG tablet Take 1 tablet (5 mg total) by mouth daily.   sildenafil (VIAGRA) 50 MG tablet Take 1 tablet (50 mg total) by mouth as needed for erectile dysfunction.   temazepam (RESTORIL) 30 MG capsule Take 1 capsule (30 mg total) by mouth at bedtime.   testosterone cypionate (DEPO-TESTOSTERONE) 100 MG/ML injection Inject 0.5 mLs (50 mg total) into the muscle every 14 (fourteen) days. For IM use only   vitamin E 180 MG (400 UNITS) capsule Take 400 Units by mouth daily.   Zinc 50 MG TABS Take 1 tablet by mouth daily.   No facility-administered encounter medications on file as of 06/20/2023.    No Known Allergies  Review of Systems As per HPI  Objective:  BP 104/66   Temp 98.7 F (37.1 C)   Ht 6\' 1"  (1.854 m)   Wt 192 lb (87.1 kg)   BMI 25.33 kg/m    Wt Readings from Last 3  Encounters:  06/20/23 192 lb (87.1 kg)  03/07/23 208 lb 9.6 oz (94.6 kg)  01/05/23 211 lb (95.7 kg)    Physical Exam Constitutional:      General: He is awake. He is not in acute distress.    Appearance: Normal appearance. He is well-developed and well-groomed. He is not ill-appearing, toxic-appearing or diaphoretic.  Cardiovascular:     Rate and Rhythm: Normal rate.     Pulses: Normal pulses.          Radial pulses are 2+ on the right side and 2+ on the left side.  Posterior tibial pulses are 2+ on the right side and 2+ on the left side.     Heart sounds: Normal heart sounds. No murmur heard.    No gallop.  Pulmonary:     Effort: Pulmonary effort is normal. No respiratory distress.     Breath sounds: Normal breath sounds. No stridor. No wheezing, rhonchi or rales.  Musculoskeletal:     Cervical back: Full passive range of motion without pain and neck supple.     Right lower leg: No edema.     Left lower leg: No edema.     Comments: Negative empty can test  Positive internal rotation test  Reduced ROM with abduction due to "stiffness"  Reduced ROM with forward flexion due to "stiffness"   Skin:    General: Skin is warm.     Capillary Refill: Capillary refill takes less than 2 seconds.  Neurological:     General: No focal deficit present.     Mental Status: He is alert, oriented to person, place, and time and easily aroused. Mental status is at baseline.     GCS: GCS eye subscore is 4. GCS verbal subscore is 5. GCS motor subscore is 6.     Motor: No weakness.  Psychiatric:        Attention and Perception: Attention and perception normal.        Mood and Affect: Mood and affect normal.        Speech: Speech normal.        Behavior: Behavior normal. Behavior is cooperative.        Thought Content: Thought content normal. Thought content does not include homicidal or suicidal ideation. Thought content does not include homicidal or suicidal plan.        Cognition and Memory:  Cognition and memory normal.        Judgment: Judgment normal.    Results for orders placed or performed in visit on 01/11/23  Bayer DCA Hb A1c Waived  Result Value Ref Range   HB A1C (BAYER DCA - WAIVED) 5.3 4.8 - 5.6 %  PSA  Result Value Ref Range   Prostate Specific Ag, Serum 0.7 0.0 - 4.0 ng/mL  Testosterone  Result Value Ref Range   Testosterone 400 264 - 916 ng/dL  VITAMIN D 25 Hydroxy (Vit-D Deficiency, Fractures)  Result Value Ref Range   Vit D, 25-Hydroxy 58.0 30.0 - 100.0 ng/mL  TSH  Result Value Ref Range   TSH 1.820 0.450 - 4.500 uIU/mL  CMP14+EGFR  Result Value Ref Range   Glucose 101 (H) 70 - 99 mg/dL   BUN 12 6 - 24 mg/dL   Creatinine, Ser 1.61 0.76 - 1.27 mg/dL   eGFR 096 >04 VW/UJW/1.19   BUN/Creatinine Ratio 14 9 - 20   Sodium 141 134 - 144 mmol/L   Potassium 4.9 3.5 - 5.2 mmol/L   Chloride 103 96 - 106 mmol/L   CO2 22 20 - 29 mmol/L   Calcium 9.8 8.7 - 10.2 mg/dL   Total Protein 7.2 6.0 - 8.5 g/dL   Albumin 4.9 3.8 - 4.9 g/dL   Globulin, Total 2.3 1.5 - 4.5 g/dL   Albumin/Globulin Ratio 2.1 1.2 - 2.2   Bilirubin Total 0.7 0.0 - 1.2 mg/dL   Alkaline Phosphatase 69 44 - 121 IU/L   AST 19 0 - 40 IU/L   ALT 26 0 - 44 IU/L  CBC with Differential/Platelet  Result Value Ref Range   WBC 7.8 3.4 - 10.8  x10E3/uL   RBC 5.33 4.14 - 5.80 x10E6/uL   Hemoglobin 16.9 13.0 - 17.7 g/dL   Hematocrit 61.4 43.1 - 51.0 %   MCV 92 79 - 97 fL   MCH 31.7 26.6 - 33.0 pg   MCHC 34.3 31.5 - 35.7 g/dL   RDW 54.0 08.6 - 76.1 %   Platelets 271 150 - 450 x10E3/uL   Neutrophils 55 Not Estab. %   Lymphs 28 Not Estab. %   Monocytes 10 Not Estab. %   Eos 6 Not Estab. %   Basos 1 Not Estab. %   Neutrophils Absolute 4.4 1.4 - 7.0 x10E3/uL   Lymphocytes Absolute 2.2 0.7 - 3.1 x10E3/uL   Monocytes Absolute 0.7 0.1 - 0.9 x10E3/uL   EOS (ABSOLUTE) 0.4 0.0 - 0.4 x10E3/uL   Basophils Absolute 0.1 0.0 - 0.2 x10E3/uL   Immature Granulocytes 0 Not Estab. %   Immature Grans (Abs) 0.0  0.0 - 0.1 x10E3/uL  Lipid panel  Result Value Ref Range   Cholesterol, Total 182 100 - 199 mg/dL   Triglycerides 950 (H) 0 - 149 mg/dL   HDL 31 (L) >93 mg/dL   VLDL Cholesterol Cal 41 (H) 5 - 40 mg/dL   LDL Chol Calc (NIH) 267 (H) 0 - 99 mg/dL   Chol/HDL Ratio 5.9 (H) 0.0 - 5.0 ratio       06/20/2023    3:49 PM 01/05/2023    3:43 PM 12/07/2022    3:52 PM 04/05/2022    8:14 AM 09/10/2021    1:34 PM  Depression screen PHQ 2/9  Decreased Interest 0 0 0 0 0  Down, Depressed, Hopeless 0 0 0 0 0  PHQ - 2 Score 0 0 0 0 0  Altered sleeping 0 0 1 0 0  Tired, decreased energy 2 1 2 3  0  Change in appetite 0 0 0 0 0  Feeling bad or failure about yourself  0 0 0 0 0  Trouble concentrating 0 0 0 0 0  Moving slowly or fidgety/restless 0 0 0 0 0  Suicidal thoughts 0 0 0 0 0  PHQ-9 Score 2 1 3 3  0  Difficult doing work/chores Somewhat difficult Not difficult at all Not difficult at all Somewhat difficult Not difficult at all       06/20/2023    3:50 PM 01/05/2023    3:50 PM 12/07/2022    3:53 PM 04/05/2022    8:15 AM  GAD 7 : Generalized Anxiety Score  Nervous, Anxious, on Edge 0 0 0 0  Control/stop worrying 0 0 0 0  Worry too much - different things 0 0 0 0  Trouble relaxing 0 0 0 0  Restless 0 0 0 0  Easily annoyed or irritable 0 0 0 0  Afraid - awful might happen 0 0 0 0  Total GAD 7 Score 0 0 0 0  Anxiety Difficulty Not difficult at all Not difficult at all Not difficult at all Not difficult at all    Pertinent labs & imaging results that were available during my care of the patient were reviewed by me and considered in my medical decision making.  Assessment & Plan:  Noach "Harvie Heck" was seen today for neck/shoulder discomfort.  Diagnoses and all orders for this visit:  Chronic right shoulder pain Referral placed as below. Patient declined referral to PT and imaging. Continue supportive treatments at home.  Will start prednisone as below as patient is already taking NSAIDs.  -  Ambulatory referral to Orthopedic Surgery -     predniSONE (DELTASONE) 20 MG tablet; Take 2 tablets (40 mg total) by mouth daily with breakfast for 5 days.  Chronic neck pain As above.  -     Ambulatory referral to Orthopedic Surgery -     predniSONE (DELTASONE) 20 MG tablet; Take 2 tablets (40 mg total) by mouth daily with breakfast for 5 days.  Continue all other maintenance medications.  Follow up plan: Return if symptoms worsen or fail to improve.   Continue healthy lifestyle choices, including diet (rich in fruits, vegetables, and lean proteins, and low in salt and simple carbohydrates) and exercise (at least 30 minutes of moderate physical activity daily).  Written and verbal instructions provided   The above assessment and management plan was discussed with the patient. The patient verbalized understanding of and has agreed to the management plan. Patient is aware to call the clinic if they develop any new symptoms or if symptoms persist or worsen. Patient is aware when to return to the clinic for a follow-up visit. Patient educated on when it is appropriate to go to the emergency department.   Neale Burly, DNP-FNP [facility] [address] [place], [place] [address] [phone]

## 2023-06-30 ENCOUNTER — Other Ambulatory Visit: Payer: BC Managed Care – PPO

## 2023-06-30 DIAGNOSIS — E782 Mixed hyperlipidemia: Secondary | ICD-10-CM | POA: Diagnosis not present

## 2023-06-30 DIAGNOSIS — E559 Vitamin D deficiency, unspecified: Secondary | ICD-10-CM | POA: Diagnosis not present

## 2023-07-04 LAB — COMPREHENSIVE METABOLIC PANEL
ALT: 23 IU/L (ref 0–44)
AST: 21 IU/L (ref 0–40)
Albumin: 4.6 g/dL (ref 3.8–4.9)
Alkaline Phosphatase: 78 IU/L (ref 44–121)
BUN/Creatinine Ratio: 13 (ref 9–20)
BUN: 11 mg/dL (ref 6–24)
Bilirubin Total: 0.8 mg/dL (ref 0.0–1.2)
CO2: 24 mmol/L (ref 20–29)
Calcium: 9.5 mg/dL (ref 8.7–10.2)
Chloride: 101 mmol/L (ref 96–106)
Creatinine, Ser: 0.88 mg/dL (ref 0.76–1.27)
Globulin, Total: 2.4 g/dL (ref 1.5–4.5)
Glucose: 83 mg/dL (ref 70–99)
Potassium: 4.1 mmol/L (ref 3.5–5.2)
Sodium: 138 mmol/L (ref 134–144)
Total Protein: 7 g/dL (ref 6.0–8.5)
eGFR: 102 mL/min/{1.73_m2} (ref >59)

## 2023-07-04 LAB — TESTOSTERONE, FREE, TOTAL, SHBG
Sex Hormone Binding: 31.4 nmol/L (ref 19.3–76.4)
Testosterone, Free: 4.7 pg/mL — ABNORMAL LOW (ref 7.2–24.0)
Testosterone: 418 ng/dL (ref 264–916)

## 2023-07-04 LAB — LIPID PANEL
Chol/HDL Ratio: 4.1 ratio (ref 0.0–5.0)
Cholesterol, Total: 141 mg/dL (ref 100–199)
HDL: 34 mg/dL — ABNORMAL LOW (ref >39)
LDL Chol Calc (NIH): 84 mg/dL (ref 0–99)
Triglycerides: 127 mg/dL (ref 0–149)
VLDL Cholesterol Cal: 23 mg/dL (ref 5–40)

## 2023-07-04 LAB — VITAMIN D 25 HYDROXY (VIT D DEFICIENCY, FRACTURES): Vit D, 25-Hydroxy: 75.5 ng/mL (ref 30.0–100.0)

## 2023-07-10 ENCOUNTER — Encounter: Payer: Self-pay | Admitting: "Endocrinology

## 2023-07-10 ENCOUNTER — Ambulatory Visit (INDEPENDENT_AMBULATORY_CARE_PROVIDER_SITE_OTHER): Payer: BC Managed Care – PPO | Admitting: "Endocrinology

## 2023-07-10 VITALS — BP 116/74 | HR 72 | Ht 73.0 in | Wt 196.4 lb

## 2023-07-10 DIAGNOSIS — E559 Vitamin D deficiency, unspecified: Secondary | ICD-10-CM

## 2023-07-10 DIAGNOSIS — E782 Mixed hyperlipidemia: Secondary | ICD-10-CM

## 2023-07-10 NOTE — Progress Notes (Signed)
07/10/2023, 7:41 PM  Endocrinology follow-up note   Subjective:    Patient ID: Devin Ryan, male    DOB: May 07, 1966, PCP Arrie Senate, FNP   Past Medical History:  Diagnosis Date   Anxiety    Diverticulitis    Eczema 11/30/2018   Headache 11/30/2018   Hyperlipidemia    Low testosterone    Vitamin D insufficiency    Past Surgical History:  Procedure Laterality Date   COLONOSCOPY WITH PROPOFOL N/A 06/20/2022   Procedure: COLONOSCOPY WITH PROPOFOL;  Surgeon: Lanelle Bal, DO;  Location: [facility];  Service: Endoscopy;  Laterality: N/A;  8:00am, asa 2   NOSE SURGERY     PARTIAL COLECTOMY N/A 09/28/2015   Procedure: PARTIAL COLECTOMY;  Surgeon: Franky Macho, MD;  Location: [facility];  Service: General;  Laterality: N/A;   POLYPECTOMY  06/20/2022   Procedure: POLYPECTOMY;  Surgeon: Lanelle Bal, DO;  Location: [facility];  Service: Endoscopy;;   Social History   Socioeconomic History   Marital status: Single    Spouse name: Not on file   Number of children: Not on file   Years of education: Not on file   Highest education level: 12th grade  Occupational History   Not on file  Tobacco Use   Smoking status: Every Day    Current packs/day: 0.50    Average packs/day: 0.5 packs/day for 30.0 years (15.0 ttl pk-yrs)    Types: Cigarettes   Smokeless tobacco: Never  Vaping Use   Vaping status: Never Used  Substance and Sexual Activity   Alcohol use: Yes    Comment: occ   Drug use: No   Sexual activity: Yes    Birth control/protection: None  Other Topics Concern   Not on file  Social History Narrative   Not on file   Social Determinants of Health   Financial Resource Strain: Low Risk  (06/19/2023)   Overall Financial Resource Strain (CARDIA)    Difficulty of Paying Living Expenses: Not hard at all  Food Insecurity: No Food Insecurity (06/19/2023)   Hunger Vital Sign    Worried About Running  Out of Food in the Last Year: Never true    Ran Out of Food in the Last Year: Never true  Transportation Needs: No Transportation Needs (06/19/2023)   PRAPARE - Administrator, Civil Service (Medical): No    Lack of Transportation (Non-Medical): No  Physical Activity: Insufficiently Active (06/19/2023)   Exercise Vital Sign    Days of Exercise per Week: 3 days    Minutes of Exercise per Session: 30 min  Stress: No Stress Concern Present (06/19/2023)   [employer] of Occupational Health - Occupational Stress Questionnaire    Feeling of Stress : Not at all  Social Connections: Socially Isolated (06/19/2023)   Social Connection and Isolation Panel [NHANES]    Frequency of Communication with Friends and Family: Once a week    Frequency of Social Gatherings with Friends and Family: Once a week    Attends Religious Services: More than 4 times per year    Active Member of [employer] or Organizations: No    Attends Banker Meetings: Not on file  Marital Status: Separated   Family History  Problem Relation Age of Onset   Diabetes Mother    Heart disease Father        Heart attack   Outpatient Encounter Medications as of 07/10/2023  Medication Sig   MAGNESIUM GLYCINATE PO Take 1 tablet by mouth daily.   albuterol (VENTOLIN HFA) 108 (90 Base) MCG/ACT inhaler INHALE 1 PUFF INTO THE LUNGS EVERY 6 HOURS AS NEEDED FOR SHORTNESS OF BREATH   cetirizine (ZYRTEC) 10 MG tablet Take 1 tablet (10 mg total) by mouth daily. (Patient taking differently: Take 10 mg by mouth daily as needed for allergies.)   Cholecalciferol (VITAMIN D3) 125 MCG (5000 UT) TABS Take 5,000 Units by mouth daily with breakfast.   fluticasone (FLONASE) 50 MCG/ACT nasal spray Place 2 sprays into both nostrils daily.   ibuprofen (ADVIL,MOTRIN) 200 MG tablet Take 800 mg by mouth every 6 (six) hours as needed for moderate pain.   Red Yeast Rice Extract (RED YEAST RICE PO) Take by mouth daily.   rosuvastatin  (CRESTOR) 5 MG tablet Take 1 tablet (5 mg total) by mouth daily. (Patient not taking: Reported on 07/10/2023)   sildenafil (VIAGRA) 50 MG tablet Take 1 tablet (50 mg total) by mouth as needed for erectile dysfunction.   temazepam (RESTORIL) 30 MG capsule Take 1 capsule (30 mg total) by mouth at bedtime. (Patient not taking: Reported on 07/10/2023)   testosterone cypionate (DEPO-TESTOSTERONE) 100 MG/ML injection Inject 0.5 mLs (50 mg total) into the muscle every 14 (fourteen) days. For IM use only (Patient not taking: Reported on 07/10/2023)   vitamin E 180 MG (400 UNITS) capsule Take 400 Units by mouth daily.   Zinc 50 MG TABS Take 1 tablet by mouth daily.   No facility-administered encounter medications on file as of 07/10/2023.   ALLERGIES: No Known Allergies  VACCINATION STATUS: Immunization History  Administered Date(s) Administered   Tdap 04/18/2022    HPI Devin Ryan is 57 y.o. male who presents today with a medical history as above. he is being seen in follow-up after he was seen in consultation for vitamin D deficiency . His previsit labs show improved vitamin D at 75 improving from 27.  He remains on vitamin D3 supplements.    He denies any prior history of GI surgery, osteoporosis, fractures.  He spends most of his time indoors.  His daily intake is average.  He presents with significant improvement in his lipid panel.  He has no new complaints today.  He has made significant changes in his lifestyle.  He continues to smoke.   Review of Systems  Constitutional: no recent weight gain/loss, no fatigue, no subjective hyperthermia, no subjective hypothermia Eyes: no blurry vision, no xerophthalmia ENT: no sore throat, no nodules palpated in throat, no dysphagia/odynophagia, no hoarseness   Objective:       07/10/2023    3:49 PM 06/20/2023    3:45 PM 03/07/2023    3:48 PM  Vitals with BMI  Height 6\' 1"  6\' 1"  6\' 1"   Weight 196 lbs 6 oz 192 lbs 208 lbs 10 oz  BMI 25.92 25.34  27.53  Systolic 116 104 454  Diastolic 74 66 78  Pulse 72  80    BP 116/74   Pulse 72   Ht 6\' 1"  (1.854 m)   Wt 196 lb 6.4 oz (89.1 kg)   BMI 25.91 kg/m   Wt Readings from Last 3 Encounters:  07/10/23 196 lb 6.4 oz (89.1 kg)  06/20/23 192 lb (87.1 kg)  03/07/23 208 lb 9.6 oz (94.6 kg)    Physical Exam  Constitutional:  Body mass index is 25.91 kg/m.,  not in acute distress, normal state of mind Eyes: PERRLA, EOMI, no exophthalmos   CMP ( most recent) CMP     Component Value Date/Time   NA 138 06/30/2023 1323   K 4.1 06/30/2023 1323   CL 101 06/30/2023 1323   CO2 24 06/30/2023 1323   GLUCOSE 83 06/30/2023 1323   GLUCOSE 93 09/30/2015 0546   BUN 11 06/30/2023 1323   CREATININE 0.88 06/30/2023 1323   CALCIUM 9.5 06/30/2023 1323   PROT 7.0 06/30/2023 1323   ALBUMIN 4.6 06/30/2023 1323   AST 21 06/30/2023 1323   ALT 23 06/30/2023 1323   ALKPHOS 78 06/30/2023 1323   BILITOT 0.8 06/30/2023 1323   GFRNONAA 92 07/31/2020 1505   GFRAA 106 07/31/2020 1505     Diabetic Labs (most recent): Lab Results  Component Value Date   HGBA1C 5.3 01/11/2023   HGBA1C 5.3 03/04/2021     Lipid Panel ( most recent) Lipid Panel     Component Value Date/Time   CHOL 141 06/30/2023 1323   TRIG 127 06/30/2023 1323   HDL 34 (L) 06/30/2023 1323   CHOLHDL 4.1 06/30/2023 1323   LDLCALC 84 06/30/2023 1323   LABVLDL 23 06/30/2023 1323      Lab Results  Component Value Date   TSH 1.820 01/11/2023   TSH 1.460 04/08/2022   TSH 2.980 10/24/2017           Assessment & Plan:   1. Vitamin D deficiency 2. Mixed hyperlipidemia -His vitamin D deficiency is now corrected.  He is advised to continue with vitamin D3 2000 units daily.    He presents with significant improvement in his lipid panel.  He has not taken the Crestor prescribed during his last visit.  He is also advised to remain engaged in lifestyle medicine as much as he can.  - he acknowledges that there is a room for  improvement in his food and drink choices. - Suggestion is made for him to avoid simple carbohydrates  from his diet including Cakes, Sweet Desserts, Ice Cream, Soda (diet and regular), Sweet Tea, Candies, Chips, Cookies, Store Bought Juices, Alcohol , Artificial Sweeteners,  Coffee Creamer, and "Sugar-free" Products, Lemonade. This will help patient to have more stable blood glucose profile and potentially avoid unintended weight gain.  The following Lifestyle Medicine recommendations according to American College of Lifestyle Medicine  [facility]) were discussed and and offered to patient and he  agrees to start the journey:  A. Whole Foods, Plant-Based Nutrition comprising of fruits and vegetables, plant-based proteins, whole-grain carbohydrates was discussed in detail with the patient.   A list for source of those nutrients were also provided to the patient.  Patient will use only water or unsweetened tea for hydration. B.  The need to stay away from risky substances including alcohol, smoking; obtaining 7 to 9 hours of restorative sleep, at least 150 minutes of moderate intensity exercise weekly, the importance of healthy social connections,  and stress management techniques were discussed. C.  A full color page of  Calorie density of various food groups per pound showing examples of each food groups was provided to the patient.  He presents with significant testosterone improvement as well, current total testosterone 418 without any supplements.  He will have repeat labs including gonadotropins, prolactin, ferritin before his next visit  in 6 months.  - he is advised to maintain close follow up with his PCP for primary care needs.   I spent  22  minutes in the care of the patient today including review of labs from Thyroid Function, CMP, and other relevant labs ; imaging/biopsy records (current and previous including abstractions from other facilities); face-to-face time discussing  his lab results and  symptoms, medications doses, his options of short and long term treatment based on the latest standards of care / guidelines;   and documenting the encounter.  Lorin Mercy  participated in the discussions, expressed understanding, and voiced agreement with the above plans.  All questions were answered to his satisfaction. he is encouraged to contact clinic should he have any questions or concerns prior to his return visit.   Follow up plan: Return in about 6 months (around 01/07/2024) for Fasting Labs  in AM B4 8, A1c -[place].   Marquis Lunch, MD [facility] [address] [place], [place] [address] Phone: [phone]  Fax: [phone]     07/10/2023, 7:41 PM  This note was partially dictated with voice recognition software. Similar sounding words can be transcribed inadequately or may not  be corrected upon review.

## 2023-07-13 ENCOUNTER — Ambulatory Visit (INDEPENDENT_AMBULATORY_CARE_PROVIDER_SITE_OTHER): Payer: BC Managed Care – PPO | Admitting: Orthopedic Surgery

## 2023-07-13 ENCOUNTER — Other Ambulatory Visit (INDEPENDENT_AMBULATORY_CARE_PROVIDER_SITE_OTHER): Payer: Self-pay

## 2023-07-13 ENCOUNTER — Encounter: Payer: Self-pay | Admitting: Orthopedic Surgery

## 2023-07-13 VITALS — BP 155/103 | HR 78 | Ht 73.0 in | Wt 192.0 lb

## 2023-07-13 DIAGNOSIS — M47812 Spondylosis without myelopathy or radiculopathy, cervical region: Secondary | ICD-10-CM | POA: Diagnosis not present

## 2023-07-13 DIAGNOSIS — G8929 Other chronic pain: Secondary | ICD-10-CM

## 2023-07-13 MED ORDER — GABAPENTIN 300 MG PO CAPS
300.0000 mg | ORAL_CAPSULE | Freq: Every day | ORAL | 2 refills | Status: DC
Start: 2023-07-13 — End: 2023-12-15

## 2023-07-13 MED ORDER — MELOXICAM 7.5 MG PO TABS: 7.5000 mg | ORAL_TABLET | Freq: Every day | ORAL | 5 refills | Status: DC

## 2023-07-13 NOTE — Progress Notes (Signed)
Patient: Devin Ryan           Date of Birth: 1966-07-08           MRN: [id number] Visit Date: 07/13/2023 Requested by: Arrie Senate, FNP [address] [place],  [place] [address] PCP: Arrie Senate, FNP   Chief Complaint  Patient presents with   Neck Pain    5 yrs of pain, seen by chiropractor    Encounter Diagnoses  Name Primary?   Chronic neck pain    Spondylosis without myelopathy or radiculopathy, cervical region Yes    Plan:   PT declined  Medication   Meds ordered this encounter  Medications   gabapentin (NEURONTIN) 300 MG capsule    Sig: Take 1 capsule (300 mg total) by mouth at bedtime.    Dispense:  30 capsule    Refill:  2   meloxicam (MOBIC) 7.5 MG tablet    Sig: Take 1 tablet (7.5 mg total) by mouth daily.    Dispense:  30 tablet    Refill:  5     Chief Complaint  Patient presents with   Neck Pain    5 yrs of pain, seen by chiropractor     57 year old male 5-year history of neck pain which started when he was doing some trimming with a pole saw.  He has tried various modalities and treatments including but not limited to prednisone chiropractic treatment massage therapy dry needling  Pain is in the cervical spine just to the right and then along the medial border of the scapula without radicular symptoms    Body mass index is 25.33 kg/m.   Problem list, medical hx, medications and allergies reviewed   Review of Systems  All other systems reviewed and are negative.    No Known Allergies  BP (!) 155/103   Pulse 78   Ht 6\' 1"  (1.854 m)   Wt 192 lb (87.1 kg)   BMI 25.33 kg/m    Physical exam: Physical Exam Vitals and nursing note reviewed.  Constitutional:      Appearance: Normal appearance.  HENT:     Head: Normocephalic and atraumatic.  Eyes:     General: No scleral icterus.       Right eye: No discharge.        Left eye: No discharge.     Extraocular Movements: Extraocular movements intact.      Conjunctiva/sclera: Conjunctivae normal.     Pupils: Pupils are equal, round, and reactive to light.  Cardiovascular:     Rate and Rhythm: Normal rate.     Pulses: Normal pulses.  Skin:    General: Skin is warm and dry.     Capillary Refill: Capillary refill takes less than 2 seconds.  Neurological:     General: No focal deficit present.     Mental Status: He is alert and oriented to person, place, and time.  Psychiatric:        Mood and Affect: Mood normal.        Behavior: Behavior normal.        Thought Content: Thought content normal.        Judgment: Judgment normal.     Ortho Exam  MSK:  Painful range of motion rotationally to the right but no radicular symptoms  Some discomfort flexion rotation to the right and is noted in the suprascapular region and just base of the cervical spine on the right  Spurling sign negative Pain with Spurling's positioning  Data reviewed:   Image(s) reviewed with personal interpretation:  Moderate cervical spondylosis C5-6 and 7  Assessment and plan:  Encounter Diagnoses  Name Primary?   Chronic neck pain    Spondylosis without myelopathy or radiculopathy, cervical region Yes    Recommend physical therapy and anti-inflammatories as well as gabapentin but patient declines physical therapy at this time  Try medication continue chiropractic treatment no surgery indicated without any neurologic symptoms   Meds ordered this encounter  Medications   gabapentin (NEURONTIN) 300 MG capsule    Sig: Take 1 capsule (300 mg total) by mouth at bedtime.    Dispense:  30 capsule    Refill:  2   meloxicam (MOBIC) 7.5 MG tablet    Sig: Take 1 tablet (7.5 mg total) by mouth daily.    Dispense:  30 tablet    Refill:  5    Procedures:   none

## 2023-12-11 ENCOUNTER — Encounter: Payer: Self-pay | Admitting: Family Medicine

## 2023-12-14 ENCOUNTER — Telehealth: Payer: Self-pay | Admitting: Family Medicine

## 2023-12-14 NOTE — Telephone Encounter (Signed)
 Patient is gonna call Dr. Isidoro Donning office to see if if lab orders can be placed before his visit tomorrow with Jerrel Ivory.

## 2023-12-14 NOTE — Telephone Encounter (Signed)
 Copied from CRM [id number]. Topic: Referral - Question >> Dec 14, 2023  1:47 PM [place] B wrote: Reason for CRM: pt returning call states was told that his referral shows for a gastroenterologist instead of endocriologist, Im not showing this. Please cb

## 2023-12-15 ENCOUNTER — Ambulatory Visit (INDEPENDENT_AMBULATORY_CARE_PROVIDER_SITE_OTHER): Admitting: Family Medicine

## 2023-12-15 ENCOUNTER — Other Ambulatory Visit

## 2023-12-15 VITALS — BP 133/78 | HR 78 | Temp 98.7°F | Ht 73.0 in | Wt 200.0 lb

## 2023-12-15 DIAGNOSIS — E782 Mixed hyperlipidemia: Secondary | ICD-10-CM | POA: Diagnosis not present

## 2023-12-15 DIAGNOSIS — R5383 Other fatigue: Secondary | ICD-10-CM | POA: Diagnosis not present

## 2023-12-15 DIAGNOSIS — N539 Unspecified male sexual dysfunction: Secondary | ICD-10-CM

## 2023-12-15 DIAGNOSIS — E559 Vitamin D deficiency, unspecified: Secondary | ICD-10-CM | POA: Diagnosis not present

## 2023-12-15 MED ORDER — TADALAFIL 10 MG PO TABS
10.0000 mg | ORAL_TABLET | ORAL | 2 refills | Status: DC | PRN
Start: 2023-12-15 — End: 2024-07-25

## 2023-12-15 NOTE — Progress Notes (Signed)
 Subjective:  Patient ID: Devin Ryan, male    DOB: Mar 06, 1966, 58 y.o.   MRN: [id number]  Patient Care Team: Arrie Senate, FNP as PCP - General (Family Medicine)   Chief Complaint:  Medical Management of Chronic Issues (Discuss medications, tx options)  HPI: Devin Ryan is a 58 y.o. male presenting on 12/15/2023 for Medical Management of Chronic Issues (Discuss medications, tx options)  ED States that he is having sinus reaction, stuffiness, and headache with using viagra. States that he was taking two 50 mg tablets at each use. He prefers to use it as needed.   Fatigue  Reports that his energy levels are dropping off in the day. He wonders if it is due to his diet. Follows [facility] [address]. Eats 2 boiled eggs in morning, Banana and apple during the day. Sometimes has avocado and tuna. Dinner varies, states that it depends on his time. Tries to do salad with air fryer skinless chicken, sometimes does salmon, steak, pork chops. Sometimes oatmeal or frozen pizza. He is not exercising during the day. He is drinking half a gallon per day.  He had multiple labs collected today for endocrinology.   Relevant past medical, surgical, family, and social history reviewed and updated as indicated.  Allergies and medications reviewed and updated. Data reviewed: Chart in Epic.   Past Medical History:  Diagnosis Date   Anxiety    Diverticulitis    Eczema 11/30/2018   Headache 11/30/2018   Hyperlipidemia    Low testosterone    Vitamin D insufficiency     Past Surgical History:  Procedure Laterality Date   COLONOSCOPY WITH PROPOFOL N/A 06/20/2022   Procedure: COLONOSCOPY WITH PROPOFOL;  Surgeon: Lanelle Bal, DO;  [facility];  Service: Endoscopy;  Laterality: N/A;  8:00am, asa 2   NOSE SURGERY     PARTIAL COLECTOMY N/A 09/28/2015   Procedure: PARTIAL COLECTOMY;  Surgeon: Franky Macho, MD;  Location: [facility];  Service: General;  Laterality: N/A;    POLYPECTOMY  06/20/2022   Procedure: POLYPECTOMY;  Surgeon: Lanelle Bal, DO;  [facility];  Service: Endoscopy;;    Social History   Socioeconomic History   Marital status: Single    Spouse name: Not on file   Number of children: Not on file   Years of education: Not on file   Highest education level: 12th grade  Occupational History   Not on file  Tobacco Use   Smoking status: Every Day    Current packs/day: 0.50    Average packs/day: 0.5 packs/day for 30.0 years (15.0 ttl pk-yrs)    Types: Cigarettes   Smokeless tobacco: Never  Vaping Use   Vaping status: Never Used  Substance and Sexual Activity   Alcohol use: Yes    Comment: occ   Drug use: No   Sexual activity: Yes    Birth control/protection: None  Other Topics Concern   Not on file  Social History Narrative   Not on file   Social Drivers of Health   Financial Resource Strain: Low Risk  (12/15/2023)   Overall Financial Resource Strain (CARDIA)    Difficulty of Paying Living Expenses: Not very hard  Food Insecurity: No Food Insecurity (12/15/2023)   Hunger Vital Sign    Worried About Running Out of Food in the Last Year: Never true    Ran Out of Food in the Last Year: Never true  Transportation Needs: No Transportation Needs (  12/15/2023)   PRAPARE - Administrator, Civil Service (Medical): No    Lack of Transportation (Non-Medical): No  Physical Activity: Inactive (12/15/2023)   Exercise Vital Sign    Days of Exercise per Week: 0 days    Minutes of Exercise per Session: 30 min  Stress: No Stress Concern Present (12/15/2023)   [employer] of Occupational Health - Occupational Stress Questionnaire    Feeling of Stress : Not at all  Social Connections: Unknown (12/15/2023)   Social Connection and Isolation Panel [NHANES]    Frequency of Communication with Friends and Family: Once a week    Frequency of Social Gatherings with Friends and Family: Once a week    Attends Religious Services:  More than 4 times per year    Active Member of [employer] or Organizations: No    Attends Banker Meetings: Not on file    Marital Status: Patient declined  Intimate Partner Violence: Not on file    Outpatient Encounter Medications as of 12/15/2023  Medication Sig   albuterol (VENTOLIN HFA) 108 (90 Base) MCG/ACT inhaler INHALE 1 PUFF INTO THE LUNGS EVERY 6 HOURS AS NEEDED FOR SHORTNESS OF BREATH   cetirizine (ZYRTEC) 10 MG tablet Take 1 tablet (10 mg total) by mouth daily. (Patient taking differently: Take 10 mg by mouth daily as needed for allergies.)   Cholecalciferol (VITAMIN D3) 125 MCG (5000 UT) TABS Take 5,000 Units by mouth daily with breakfast.   fluticasone (FLONASE) 50 MCG/ACT nasal spray Place 2 sprays into both nostrils daily.   ibuprofen (ADVIL,MOTRIN) 200 MG tablet Take 800 mg by mouth every 6 (six) hours as needed for moderate pain.   MAGNESIUM GLYCINATE PO Take 1 tablet by mouth daily.   meloxicam (MOBIC) 7.5 MG tablet Take 1 tablet (7.5 mg total) by mouth daily.   Red Yeast Rice Extract (RED YEAST RICE PO) Take by mouth daily.   sildenafil (VIAGRA) 50 MG tablet Take 1 tablet (50 mg total) by mouth as needed for erectile dysfunction.   testosterone cypionate (DEPO-TESTOSTERONE) 100 MG/ML injection Inject 0.5 mLs (50 mg total) into the muscle every 14 (fourteen) days. For IM use only   vitamin E 180 MG (400 UNITS) capsule Take 400 Units by mouth daily.   Zinc 50 MG TABS Take 1 tablet by mouth daily.   [DISCONTINUED] rosuvastatin (CRESTOR) 5 MG tablet Take 1 tablet (5 mg total) by mouth daily.   [DISCONTINUED] temazepam (RESTORIL) 30 MG capsule Take 1 capsule (30 mg total) by mouth at bedtime.   [DISCONTINUED] gabapentin (NEURONTIN) 300 MG capsule Take 1 capsule (300 mg total) by mouth at bedtime.   No facility-administered encounter medications on file as of 12/15/2023.    No Known Allergies  Review of Systems As per HPI  Objective:  BP 133/78   Pulse 78   Temp  98.7 F (37.1 C)   Ht 6\' 1"  (1.854 m)   Wt 200 lb (90.7 kg)   SpO2 96%   BMI 26.39 kg/m    Wt Readings from Last 3 Encounters:  12/15/23 200 lb (90.7 kg)  07/13/23 192 lb (87.1 kg)  07/10/23 196 lb 6.4 oz (89.1 kg)   Physical Exam Constitutional:      General: He is awake. He is not in acute distress.    Appearance: Normal appearance. He is well-developed and well-groomed. He is not ill-appearing, toxic-appearing or diaphoretic.  Cardiovascular:     Rate and Rhythm: Normal rate and regular rhythm.  Pulses: Normal pulses.          Radial pulses are 2+ on the right side and 2+ on the left side.       Posterior tibial pulses are 2+ on the right side and 2+ on the left side.     Heart sounds: Normal heart sounds. No murmur heard.    No gallop.  Pulmonary:     Effort: Pulmonary effort is normal. No respiratory distress.     Breath sounds: Normal breath sounds. No stridor. No wheezing, rhonchi or rales.  Musculoskeletal:     Cervical back: Full passive range of motion without pain and neck supple.     Right lower leg: No edema.     Left lower leg: No edema.  Skin:    General: Skin is warm.     Capillary Refill: Capillary refill takes less than 2 seconds.  Neurological:     General: No focal deficit present.     Mental Status: He is alert, oriented to person, place, and time and easily aroused. Mental status is at baseline.     GCS: GCS eye subscore is 4. GCS verbal subscore is 5. GCS motor subscore is 6.     Motor: No weakness.  Psychiatric:        Attention and Perception: Attention and perception normal.        Mood and Affect: Mood and affect normal.        Speech: Speech normal.        Behavior: Behavior normal. Behavior is cooperative.        Thought Content: Thought content normal. Thought content does not include homicidal or suicidal ideation. Thought content does not include homicidal or suicidal plan.        Cognition and Memory: Cognition and memory normal.         Judgment: Judgment normal.     Results for orders placed or performed in visit on 03/07/23  Comprehensive metabolic panel   Collection Time: 06/30/23  1:23 PM  Result Value Ref Range   Glucose 83 70 - 99 mg/dL   BUN 11 6 - 24 mg/dL   Creatinine, Ser 0.45 0.76 - 1.27 mg/dL   eGFR 409 >81 XB/JYN/8.29   BUN/Creatinine Ratio 13 9 - 20   Sodium 138 134 - 144 mmol/L   Potassium 4.1 3.5 - 5.2 mmol/L   Chloride 101 96 - 106 mmol/L   CO2 24 20 - 29 mmol/L   Calcium 9.5 8.7 - 10.2 mg/dL   Total Protein 7.0 6.0 - 8.5 g/dL   Albumin 4.6 3.8 - 4.9 g/dL   Globulin, Total 2.4 1.5 - 4.5 g/dL   Bilirubin Total 0.8 0.0 - 1.2 mg/dL   Alkaline Phosphatase 78 44 - 121 IU/L   AST 21 0 - 40 IU/L   ALT 23 0 - 44 IU/L  Lipid panel   Collection Time: 06/30/23  1:23 PM  Result Value Ref Range   Cholesterol, Total 141 100 - 199 mg/dL   Triglycerides 562 0 - 149 mg/dL   HDL 34 (L) >13 mg/dL   VLDL Cholesterol Cal 23 5 - 40 mg/dL   LDL Chol Calc (NIH) 84 0 - 99 mg/dL   Chol/HDL Ratio 4.1 0.0 - 5.0 ratio  Testosterone, Free, Total, SHBG   Collection Time: 06/30/23  1:23 PM  Result Value Ref Range   Testosterone 418 264 - 916 ng/dL   Testosterone, Free 4.7 (L) 7.2 - 24.0 pg/mL   Sex  Hormone Binding 31.4 19.3 - 76.4 nmol/L  VITAMIN D 25 Hydroxy (Vit-D Deficiency, Fractures)   Collection Time: 06/30/23  1:23 PM  Result Value Ref Range   Vit D, 25-Hydroxy 75.5 30.0 - 100.0 ng/mL       12/15/2023    2:04 PM 06/20/2023    3:49 PM 01/05/2023    3:43 PM 12/07/2022    3:52 PM 04/05/2022    8:14 AM  Depression screen PHQ 2/9  Decreased Interest 0 0 0 0 0  Down, Depressed, Hopeless 0 0 0 0 0  PHQ - 2 Score 0 0 0 0 0  Altered sleeping 0 0 0 1 0  Tired, decreased energy 3 2 1 2 3   Change in appetite 0 0 0 0 0  Feeling bad or failure about yourself  0 0 0 0 0  Trouble concentrating 0 0 0 0 0  Moving slowly or fidgety/restless 0 0 0 0 0  Suicidal thoughts 0 0 0 0 0  PHQ-9 Score 3 2 1 3 3   Difficult  doing work/chores Somewhat difficult Somewhat difficult Not difficult at all Not difficult at all Somewhat difficult       12/15/2023    2:05 PM 06/20/2023    3:50 PM 01/05/2023    3:50 PM 12/07/2022    3:53 PM  GAD 7 : Generalized Anxiety Score  Nervous, Anxious, on Edge 0 0 0 0  Control/stop worrying 0 0 0 0  Worry too much - different things 0 0 0 0  Trouble relaxing 0 0 0 0  Restless 0 0 0 0  Easily annoyed or irritable 0 0 0 0  Afraid - awful might happen 0 0 0 0  Total GAD 7 Score 0 0 0 0  Anxiety Difficulty  Not difficult at all Not difficult at all Not difficult at all    Pertinent labs & imaging results that were available during my care of the patient were reviewed by me and considered in my medical decision making.  Assessment & Plan:  Milus "Harvie Heck" was seen today for medical management of chronic issues.  Diagnoses and all orders for this visit:  Male sexual dysfunction Will start medication as below given side effects to viagra. Discussed side effects of medication. Offered counseling, patient declined.  -     tadalafil (CIALIS) 10 MG tablet; Take 1 tablet (10 mg total) by mouth every other day as needed for erectile dysfunction.  Other fatigue Discussed adding more protein into diet. Patient to complete labs with endocrinology and follow up from there.    Continue all other maintenance medications.  Follow up plan: Return in about 3 months (around 03/16/2024) for Chronic Condition Follow up.   Continue healthy lifestyle choices, including diet (rich in fruits, vegetables, and lean proteins, and low in salt and simple carbohydrates) and exercise (at least 30 minutes of moderate physical activity daily).  Written and verbal instructions provided   The above assessment and management plan was discussed with the patient. The patient verbalized understanding of and has agreed to the management plan. Patient is aware to call the clinic if they develop any new symptoms  or if symptoms persist or worsen. Patient is aware when to return to the clinic for a follow-up visit. Patient educated on when it is appropriate to go to the emergency department.   Neale Burly, DNP-FNP [facility] [address] [place], [place] [address] [phone]

## 2023-12-16 LAB — CBC WITH DIFFERENTIAL/PLATELET
Basophils Absolute: 0.1 10*3/uL (ref 0.0–0.2)
Basos: 1 %
EOS (ABSOLUTE): 0.5 10*3/uL — ABNORMAL HIGH (ref 0.0–0.4)
Eos: 6 %
Hematocrit: 49.2 % (ref 37.5–51.0)
Hemoglobin: 17.3 g/dL (ref 13.0–17.7)
Immature Grans (Abs): 0 10*3/uL (ref 0.0–0.1)
Immature Granulocytes: 0 %
Lymphocytes Absolute: 2.5 10*3/uL (ref 0.7–3.1)
Lymphs: 32 %
MCH: 33 pg (ref 26.6–33.0)
MCHC: 35.2 g/dL (ref 31.5–35.7)
MCV: 94 fL (ref 79–97)
Monocytes Absolute: 0.8 10*3/uL (ref 0.1–0.9)
Monocytes: 10 %
Neutrophils Absolute: 4 10*3/uL (ref 1.4–7.0)
Neutrophils: 51 %
Platelets: 272 10*3/uL (ref 150–450)
RBC: 5.24 x10E6/uL (ref 4.14–5.80)
RDW: 11.9 % (ref 11.6–15.4)
WBC: 7.9 10*3/uL (ref 3.4–10.8)

## 2023-12-16 LAB — FERRITIN: Ferritin: 236 ng/mL (ref 30–400)

## 2023-12-16 LAB — LUTEINIZING HORMONE: LH: 3.6 m[IU]/mL (ref 1.7–8.6)

## 2023-12-16 LAB — LIPID PANEL
Chol/HDL Ratio: 4.9 ratio (ref 0.0–5.0)
Cholesterol, Total: 178 mg/dL (ref 100–199)
HDL: 36 mg/dL — ABNORMAL LOW (ref >39)
LDL Chol Calc (NIH): 115 mg/dL — ABNORMAL HIGH (ref 0–99)
Triglycerides: 152 mg/dL — ABNORMAL HIGH (ref 0–149)
VLDL Cholesterol Cal: 27 mg/dL (ref 5–40)

## 2023-12-16 LAB — PROLACTIN: Prolactin: 6.1 ng/mL (ref 3.6–25.2)

## 2023-12-16 LAB — VITAMIN D 25 HYDROXY (VIT D DEFICIENCY, FRACTURES): Vit D, 25-Hydroxy: 69.7 ng/mL (ref 30.0–100.0)

## 2023-12-16 LAB — COMPREHENSIVE METABOLIC PANEL
ALT: 29 IU/L (ref 0–44)
AST: 24 IU/L (ref 0–40)
Albumin: 4.7 g/dL (ref 3.8–4.9)
Alkaline Phosphatase: 73 IU/L (ref 44–121)
BUN/Creatinine Ratio: 11 (ref 9–20)
BUN: 11 mg/dL (ref 6–24)
Bilirubin Total: 0.7 mg/dL (ref 0.0–1.2)
CO2: 23 mmol/L (ref 20–29)
Calcium: 9.5 mg/dL (ref 8.7–10.2)
Chloride: 102 mmol/L (ref 96–106)
Creatinine, Ser: 0.99 mg/dL (ref 0.76–1.27)
Globulin, Total: 2.4 g/dL (ref 1.5–4.5)
Glucose: 98 mg/dL (ref 70–99)
Potassium: 4.6 mmol/L (ref 3.5–5.2)
Sodium: 139 mmol/L (ref 134–144)
Total Protein: 7.1 g/dL (ref 6.0–8.5)
eGFR: 90 mL/min/{1.73_m2} (ref >59)

## 2023-12-16 LAB — PSA: Prostate Specific Ag, Serum: 0.8 ng/mL (ref 0.0–4.0)

## 2023-12-16 LAB — FOLLICLE STIMULATING HORMONE: FSH: 5.5 m[IU]/mL (ref 1.5–12.4)

## 2023-12-27 ENCOUNTER — Other Ambulatory Visit: Payer: Self-pay | Admitting: Family Medicine

## 2023-12-27 DIAGNOSIS — H6993 Unspecified Eustachian tube disorder, bilateral: Secondary | ICD-10-CM

## 2024-01-08 ENCOUNTER — Encounter: Payer: Self-pay | Admitting: "Endocrinology

## 2024-01-08 ENCOUNTER — Ambulatory Visit (INDEPENDENT_AMBULATORY_CARE_PROVIDER_SITE_OTHER): Payer: BC Managed Care – PPO | Admitting: "Endocrinology

## 2024-01-08 VITALS — BP 110/74 | HR 84 | Ht 73.0 in | Wt 205.6 lb

## 2024-01-08 DIAGNOSIS — E782 Mixed hyperlipidemia: Secondary | ICD-10-CM

## 2024-01-08 DIAGNOSIS — E559 Vitamin D deficiency, unspecified: Secondary | ICD-10-CM | POA: Diagnosis not present

## 2024-01-08 LAB — POCT GLYCOSYLATED HEMOGLOBIN (HGB A1C)

## 2024-01-08 NOTE — Progress Notes (Signed)
 01/08/2024, 6:12 PM  Endocrinology follow-up note   Subjective:    Patient ID: Devin Ryan, male    DOB: 09-11-1966, PCP Arrie Senate, FNP   Past Medical History:  Diagnosis Date   Anxiety    Diverticulitis    Eczema 11/30/2018   Headache 11/30/2018   Hyperlipidemia    Low testosterone    Vitamin D insufficiency    Past Surgical History:  Procedure Laterality Date   COLONOSCOPY WITH PROPOFOL N/A 06/20/2022   Procedure: COLONOSCOPY WITH PROPOFOL;  Surgeon: Lanelle Bal, DO;  [facility];  Service: Endoscopy;  Laterality: N/A;  8:00am, asa 2   NOSE SURGERY     PARTIAL COLECTOMY N/A 09/28/2015   Procedure: PARTIAL COLECTOMY;  Surgeon: Franky Macho, MD;  Location: [facility];  Service: General;  Laterality: N/A;   POLYPECTOMY  06/20/2022   Procedure: POLYPECTOMY;  Surgeon: Lanelle Bal, DO;  [facility];  Service: Endoscopy;;   Social History   Socioeconomic History   Marital status: Single    Spouse name: Not on file   Number of children: Not on file   Years of education: Not on file   Highest education level: 12th grade  Occupational History   Not on file  Tobacco Use   Smoking status: Every Day    Current packs/day: 0.50    Average packs/day: 0.5 packs/day for 30.0 years (15.0 ttl pk-yrs)    Types: Cigarettes   Smokeless tobacco: Never  Vaping Use   Vaping status: Never Used  Substance and Sexual Activity   Alcohol use: Yes    Comment: occ   Drug use: No   Sexual activity: Yes    Birth control/protection: None  Other Topics Concern   Not on file  Social History Narrative   Not on file   Social Drivers of Health   Financial Resource Strain: Low Risk  (12/15/2023)   Overall Financial Resource Strain (CARDIA)    Difficulty of Paying Living Expenses: Not very hard  Food Insecurity: No Food Insecurity (12/15/2023)   Hunger Vital Sign    Worried About Running Out of  Food in the Last Year: Never true    Ran Out of Food in the Last Year: Never true  Transportation Needs: No Transportation Needs (12/15/2023)   PRAPARE - Administrator, Civil Service (Medical): No    Lack of Transportation (Non-Medical): No  Physical Activity: Inactive (12/15/2023)   Exercise Vital Sign    Days of Exercise per Week: 0 days    Minutes of Exercise per Session: 30 min  Stress: No Stress Concern Present (12/15/2023)   [employer] of Occupational Health - Occupational Stress Questionnaire    Feeling of Stress : Not at all  Social Connections: Unknown (12/15/2023)   Social Connection and Isolation Panel [NHANES]    Frequency of Communication with Friends and Family: Once a week    Frequency of Social Gatherings with Friends and Family: Once a week    Attends Religious Services: More than 4 times per year    Active Member of [employer] or Organizations: No    Attends Banker Meetings: Not on file    Marital Status:  Patient declined   Family History  Problem Relation Age of Onset   Diabetes Mother    Heart disease Father        Heart attack   Outpatient Encounter Medications as of 01/08/2024  Medication Sig   albuterol (VENTOLIN HFA) 108 (90 Base) MCG/ACT inhaler INHALE 1 PUFF INTO THE LUNGS EVERY 6 HOURS AS NEEDED FOR SHORTNESS OF BREATH   cetirizine (ZYRTEC) 10 MG tablet Take 1 tablet (10 mg total) by mouth daily. (Patient taking differently: Take 10 mg by mouth daily as needed for allergies.)   Cholecalciferol (VITAMIN D3) 125 MCG (5000 UT) TABS Take 5,000 Units by mouth daily with breakfast.   fluticasone (FLONASE) 50 MCG/ACT nasal spray USE 2 SPRAYS IN EACH NOSTRIL ONCE DAILY   ibuprofen (ADVIL,MOTRIN) 200 MG tablet Take 800 mg by mouth every 6 (six) hours as needed for moderate pain.   MAGNESIUM GLYCINATE PO Take 1 tablet by mouth daily.   meloxicam (MOBIC) 7.5 MG tablet Take 1 tablet (7.5 mg total) by mouth daily. (Patient not taking: Reported on  01/08/2024)   Red Yeast Rice Extract (RED YEAST RICE PO) Take by mouth daily.   tadalafil (CIALIS) 10 MG tablet Take 1 tablet (10 mg total) by mouth every other day as needed for erectile dysfunction.   testosterone cypionate (DEPO-TESTOSTERONE) 100 MG/ML injection Inject 0.5 mLs (50 mg total) into the muscle every 14 (fourteen) days. For IM use only   vitamin E 180 MG (400 UNITS) capsule Take 400 Units by mouth daily.   Zinc 50 MG TABS Take 1 tablet by mouth daily.   No facility-administered encounter medications on file as of 01/08/2024.   ALLERGIES: No Known Allergies  VACCINATION STATUS: Immunization History  Administered Date(s) Administered   Tdap 04/18/2022    HPI Devin Ryan is 58 y.o. male who presents today with a medical history as above. he is being seen in follow-up after he was seen in consultation for vitamin D deficiency . His previsit labs show improved vitamin D at 69 improving from 27.  He remains on vitamin D3 supplements.    He denies any prior history of GI surgery, osteoporosis, fractures.  He spends most of his time indoors.  His daily intake is average.  He presents with significant loss of control in his lipid panel with LDL at 115.    He has no new complaints today. He continues to smoke.   Review of Systems  Constitutional: no recent weight gain/loss, no fatigue, no subjective hyperthermia, no subjective hypothermia Eyes: no blurry vision, no xerophthalmia ENT: no sore throat, no nodules palpated in throat, no dysphagia/odynophagia, no hoarseness   Objective:       01/08/2024    3:44 PM 12/15/2023    2:02 PM 07/13/2023    4:07 PM  Vitals with BMI  Height 6\' 1"  6\' 1"  6\' 1"   Weight 205 lbs 10 oz 200 lbs 192 lbs  BMI 27.13 26.39 25.34  Systolic 110 133 811  Diastolic 74 78 103  Pulse 84 78 78    BP 110/74   Pulse 84   Ht 6\' 1"  (1.854 m)   Wt 205 lb 9.6 oz (93.3 kg)   BMI 27.13 kg/m   Wt Readings from Last 3 Encounters:  01/08/24 205 lb  9.6 oz (93.3 kg)  12/15/23 200 lb (90.7 kg)  07/13/23 192 lb (87.1 kg)    Physical Exam  Constitutional:  Body mass index is 27.13 kg/m.,  not in acute  distress, normal state of mind Eyes: PERRLA, EOMI, no exophthalmos   CMP ( most recent) CMP     Component Value Date/Time   NA 139 12/15/2023 0854   K 4.6 12/15/2023 0854   CL 102 12/15/2023 0854   CO2 23 12/15/2023 0854   GLUCOSE 98 12/15/2023 0854   GLUCOSE 93 09/30/2015 0546   BUN 11 12/15/2023 0854   CREATININE 0.99 12/15/2023 0854   CALCIUM 9.5 12/15/2023 0854   PROT 7.1 12/15/2023 0854   ALBUMIN 4.7 12/15/2023 0854   AST 24 12/15/2023 0854   ALT 29 12/15/2023 0854   ALKPHOS 73 12/15/2023 0854   BILITOT 0.7 12/15/2023 0854   GFRNONAA 92 07/31/2020 1505   GFRAA 106 07/31/2020 1505     Diabetic Labs (most recent): Lab Results  Component Value Date   HGBA1C 5.3 01/11/2023   HGBA1C 5.3 03/04/2021     Lipid Panel ( most recent) Lipid Panel     Component Value Date/Time   CHOL 178 12/15/2023 0854   TRIG 152 (H) 12/15/2023 0854   HDL 36 (L) 12/15/2023 0854   CHOLHDL 4.9 12/15/2023 0854   LDLCALC 115 (H) 12/15/2023 0854   LABVLDL 27 12/15/2023 0854      Lab Results  Component Value Date   TSH 1.820 01/11/2023   TSH 1.460 04/08/2022   TSH 2.980 10/24/2017           Assessment & Plan:   1. Vitamin D deficiency 2. Mixed hyperlipidemia -His vitamin D deficiency is now corrected at 86.  He is advised to continue with vitamin D3 2000 units every other day.   He presents with significant worsening in his lipid panel.  He has not taken the Crestor prescribed during his last visit.  He is also advised to remain engaged in lifestyle medicine as much as he can. - he acknowledges that there is a room for improvement in his food and drink choices. - Suggestion is made for him to avoid simple carbohydrates  from his diet including Cakes, Sweet Desserts, Ice Cream, Soda (diet and regular), Sweet Tea, Candies,  Chips, Cookies, Store Bought Juices, Alcohol , Artificial Sweeteners,  Coffee Creamer, and "Sugar-free" Products, Lemonade. This will help patient to have more stable blood glucose profile and potentially avoid unintended weight gain.  The following Lifestyle Medicine recommendations according to American College of Lifestyle Medicine  [facility]) were discussed and and offered to patient and he  agrees to start the journey:  A. Whole Foods, Plant-Based Nutrition comprising of fruits and vegetables, plant-based proteins, whole-grain carbohydrates was discussed in detail with the patient.   A list for source of those nutrients were also provided to the patient.  Patient will use only water or unsweetened tea for hydration. B.  The need to stay away from risky substances including alcohol, smoking; obtaining 7 to 9 hours of restorative sleep, at least 150 minutes of moderate intensity exercise weekly, the importance of healthy social connections,  and stress management techniques were discussed. C.  A full color page of  Calorie density of various food groups per pound showing examples of each food groups was provided to the patient.   He presents with significant testosterone improvement as well, current total testosterone 418 without any supplements.   He does not have a dx of hypogonadism, he will not need rx for testosterone.  - he is advised to maintain close follow up with his PCP for primary care needs.    I spent  21  minutes in the care of the patient today including review of labs from Thyroid Function, CMP, and other relevant labs ; imaging/biopsy records (current and previous including abstractions from other facilities); face-to-face time discussing  his lab results and symptoms, medications doses, his options of short and long term treatment based on the latest standards of care / guidelines;   and documenting the encounter.  Lorin Mercy  participated in the discussions, expressed  understanding, and voiced agreement with the above plans.  All questions were answered to his satisfaction. he is encouraged to contact clinic should he have any questions or concerns prior to his return visit.    Follow up plan: Return in about 6 months (around 07/09/2024) for Fasting Labs  in AM B4 8.   Marquis Lunch, MD [facility] [address] [place], [place] [address] Phone: [phone]  Fax: [phone]     01/08/2024, 6:12 PM  This note was partially dictated with voice recognition software. Similar sounding words can be transcribed inadequately or may not  be corrected upon review.

## 2024-01-21 ENCOUNTER — Encounter: Payer: Self-pay | Admitting: Family Medicine

## 2024-01-22 ENCOUNTER — Encounter: Payer: Self-pay | Admitting: Family Medicine

## 2024-01-22 ENCOUNTER — Ambulatory Visit: Admitting: Family Medicine

## 2024-01-22 VITALS — BP 111/65 | HR 67 | Temp 98.3°F | Ht 73.0 in | Wt 204.0 lb

## 2024-01-22 DIAGNOSIS — S4991XA Unspecified injury of right shoulder and upper arm, initial encounter: Secondary | ICD-10-CM

## 2024-01-22 DIAGNOSIS — R29898 Other symptoms and signs involving the musculoskeletal system: Secondary | ICD-10-CM | POA: Diagnosis not present

## 2024-01-22 DIAGNOSIS — M6281 Muscle weakness (generalized): Secondary | ICD-10-CM | POA: Diagnosis not present

## 2024-01-22 NOTE — Progress Notes (Signed)
   Acute Office Visit  Subjective:     Patient ID: Devin Ryan, male    DOB: January 08, 1966, 58 y.o.   MRN: [id number]  Chief Complaint  Patient presents with   Right arm pain    Throwing frisbee with dog yesterday and heard something pop in right elbow. Now very painful     Arm Pain  The incident occurred 12 to 24 hours ago. The incident occurred in the yard. Injury mechanism: throwing frisbee. The pain is present in the right forearm, upper right arm, right elbow and right hand (Pain worse in anterior elbow. Radiates to upper arm and forearm down to hand). The quality of the pain is described as aching, burning, shooting and stabbing. The pain is severe. The pain has been Worsening since the incident. Associated symptoms include muscle weakness, numbness and tingling. The symptoms are aggravated by movement and lifting. He has tried ice, rest, NSAIDs and acetaminophen for the symptoms. The treatment provided no relief.   He has been unable to pick up keys, lift a coffee cup with right hand today.   Review of Systems  Neurological:  Positive for tingling and numbness.        Objective:    BP 111/65   Pulse 67   Temp 98.3 F (36.8 C) (Temporal)   Ht 6\' 1"  (1.854 m)   Wt 204 lb (92.5 kg)   SpO2 98%   BMI 26.91 kg/m    Physical Exam Vitals and nursing note reviewed.  Constitutional:      General: He is not in acute distress.    Appearance: He is not ill-appearing, toxic-appearing or diaphoretic.  Musculoskeletal:     Right upper arm: No swelling, deformity, tenderness or bony tenderness.     Right elbow: No swelling, deformity or effusion. No tenderness.     Right forearm: No swelling, edema, deformity or tenderness.     Right wrist: No swelling, deformity, effusion, tenderness, bony tenderness or crepitus.     Right hand: No swelling, deformity, tenderness or bony tenderness. Normal range of motion.     Comments: Decreased strength of right upper extremity and hand. -  Hook test.   Skin:    General: Skin is warm and dry.  Neurological:     General: No focal deficit present.     Mental Status: He is alert and oriented to person, place, and time.  Psychiatric:        Mood and Affect: Mood normal.        Behavior: Behavior normal.     No results found for any visits on 01/22/24.      Assessment & Plan:   Song "[place]" was seen today for right arm pain.  Diagnoses and all orders for this visit:  Arm injury, right, initial encounter -     Ambulatory referral to Orthopedic Surgery  Decreased muscle strength -     Ambulatory referral to Orthopedic Surgery  Decreased grip strength of right hand -     Ambulatory referral to Orthopedic Surgery   Concern for tendon or ligament injury. STAT referral to ortho for further evaluation. RICE therapy and sling discussed until ortho appt.     Albertha Huger, FNP

## 2024-01-22 NOTE — Patient Instructions (Signed)
 Orthopedic Urgent Care in [facility] [address] [place], [place] [address] Located in [place]  Contact Phone: [phone]  Orthopedic Urgent Care Hours Monday :  9:00am to 5:00pm  Tuesday:  9:00am to 5:00pm  Wednesday:  9:00am to 5:00pm  Thursday:  9:00am to 5:00pm  Friday:  9:00am to 5:00pm

## 2024-01-31 ENCOUNTER — Encounter: Admitting: Orthopaedic Surgery

## 2024-02-07 ENCOUNTER — Other Ambulatory Visit (INDEPENDENT_AMBULATORY_CARE_PROVIDER_SITE_OTHER)

## 2024-02-07 ENCOUNTER — Encounter: Payer: Self-pay | Admitting: Orthopaedic Surgery

## 2024-02-07 ENCOUNTER — Ambulatory Visit (INDEPENDENT_AMBULATORY_CARE_PROVIDER_SITE_OTHER): Admitting: Orthopaedic Surgery

## 2024-02-07 VITALS — BP 122/79 | HR 60 | Ht 71.0 in | Wt 195.0 lb

## 2024-02-07 DIAGNOSIS — M25521 Pain in right elbow: Secondary | ICD-10-CM

## 2024-02-07 MED ORDER — PREDNISONE 5 MG (21) PO TBPK: ORAL_TABLET | ORAL | 0 refills | Status: DC

## 2024-02-07 NOTE — Progress Notes (Signed)
 Call central scheduling to schedule MRI at [facility] at [phone].

## 2024-02-07 NOTE — Patient Instructions (Signed)
 While we are working on your approval for MRI please go ahead and call to schedule your appointment with [facility] within at least one (1) week.   Central Scheduling [phone]

## 2024-02-07 NOTE — Progress Notes (Signed)
 I hurt my elbow.  He injured his elbow on the right playing Frisbees with his dogs on 4-13.25.  He felt a pop in his elbow and has had pain since then over the biceps tendon area and medially.  He went to [place] the next day.  I have reviewed the notes.  He has had swelling of his hand and forearm at times since then.  He has modified his work activities which has helped some.  He has had numbness of the little finger at times, better now.  He has lost strength to the right hand he has noticed.  He is right hand dominant.    He has no redness.  He has a knot in the arm near the biceps tendon medially.  He is not getting better.    ROM of the right elbow is full but tender.  He has no redness.  He is very tender at the biceps origin at the elbow.  He has no swelling.  Grips are good but right is slightly weaker than the left.  NV intact.  Intrinsics are weaker on the right than the left.  ROM of neck and left elbow full.  X-rays were done of the right elbow, reported separately.  Encounter Diagnoses  Name Primary?   Pain in right elbow Yes   Right elbow pain    I am concerned about partial biceps tear.  I will get MRI of the right elbow.  He has not improved over several weeks.  I will give prednisone  dose pack.  Stop the ibuprofen when on the prednisone  and resume when completed.  Return in two weeks.  Call if any problem.  Precautions discussed.  Electronically Signed Pleasant Brilliant, MD 4/30/202510:52 AM

## 2024-02-12 ENCOUNTER — Ambulatory Visit (HOSPITAL_COMMUNITY)
Admission: RE | Admit: 2024-02-12 | Discharge: 2024-02-12 | Disposition: A | Discharge status: Home / Self Care | Source: Ambulatory Visit | Attending: Orthopaedic Surgery | Admitting: Orthopaedic Surgery

## 2024-02-12 DIAGNOSIS — M25521 Pain in right elbow: Secondary | ICD-10-CM | POA: Insufficient documentation

## 2024-02-12 DIAGNOSIS — R609 Edema, unspecified: Secondary | ICD-10-CM | POA: Diagnosis not present

## 2024-02-12 DIAGNOSIS — M19021 Primary osteoarthritis, right elbow: Secondary | ICD-10-CM | POA: Diagnosis not present

## 2024-02-12 DIAGNOSIS — S53401A Unspecified sprain of right elbow, initial encounter: Secondary | ICD-10-CM | POA: Diagnosis not present

## 2024-02-12 DIAGNOSIS — S56511A Strain of other extensor muscle, fascia and tendon at forearm level, right arm, initial encounter: Secondary | ICD-10-CM | POA: Diagnosis not present

## 2024-02-28 ENCOUNTER — Ambulatory Visit (INDEPENDENT_AMBULATORY_CARE_PROVIDER_SITE_OTHER): Admitting: Orthopaedic Surgery

## 2024-02-28 ENCOUNTER — Encounter: Payer: Self-pay | Admitting: Orthopaedic Surgery

## 2024-02-28 VITALS — BP 143/70 | HR 70 | Ht 71.0 in | Wt 195.0 lb

## 2024-02-28 DIAGNOSIS — M25521 Pain in right elbow: Secondary | ICD-10-CM | POA: Diagnosis not present

## 2024-02-28 NOTE — Patient Instructions (Signed)
 Referral has been placed to see Dr. Merlinda Starling at [facility], they will call to schedule. Please call [phone].

## 2024-02-28 NOTE — Progress Notes (Signed)
 My elbow still hurts.  He had the MRI of the right elbow showing: IMPRESSION: 1. Mild-to-moderate partial-thickness tear of approximately 30% of the most distal biceps tendon inserting fibers with minimal 1.4 cm retraction of the torn tendon fibers. 2. Mild-to-moderate partial-thickness tear of the common extensor tendon origin. Partial-thickness tearing of the proximal radial collateral ligament adjacent to the common extensor tendon partial-thickness tear. 3. Mild sprain of the proximal ulnar collateral ligament. 4. Mild elbow cartilage degenerative changes.   I have explained the findings to him.  I will have him see the hand surgeon in our [place] office.  I have independently reviewed the MRI.    He has pain at the elbow over the proximal radius and insertion/origin of biceps tendon there.  He has no redness.  ROM is full but painful.  NV intact.  Encounter Diagnosis  Name Primary?   Pain in right elbow Yes   To hand surgeon.  Call if any problem.  Precautions discussed.  Electronically Signed Pleasant Brilliant, MD 5/21/20252:16 PM

## 2024-03-25 ENCOUNTER — Ambulatory Visit (INDEPENDENT_AMBULATORY_CARE_PROVIDER_SITE_OTHER): Admitting: Orthopedic Surgery

## 2024-03-25 DIAGNOSIS — S46211A Strain of muscle, fascia and tendon of other parts of biceps, right arm, initial encounter: Secondary | ICD-10-CM | POA: Diagnosis not present

## 2024-03-25 DIAGNOSIS — M7711 Lateral epicondylitis, right elbow: Secondary | ICD-10-CM

## 2024-03-25 DIAGNOSIS — M25521 Pain in right elbow: Secondary | ICD-10-CM

## 2024-03-25 NOTE — Progress Notes (Signed)
 Devin Ryan - 58 y.o. male MRN [id number]  Date of birth: 1966/04/22  Office Visit Note: Visit Date: 03/25/2024 PCP: Chrystine Crate, FNP Referred by: Devin Brilliant, MD  Subjective: No chief complaint on file.  HPI: Devin Ryan is a Devin 58 y.o. male who presents today for specific hand surgical evaluation after referral from Dr. Iline Mallory.    Approximately 9 weeks ago, he was playing Frisbee with his dog, felt a pop in the right elbow with associated pain and swelling.  He was sent for a subsequent MRI of the right elbow which showed a partial tear of the right distal biceps.  Since that time, he has done home exercises for the right elbow with notable improvement in his symptoms and pain.  States that the swelling has resolved.  Did have some numbness tingling in the ulnar aspect of the hand which is also improved.  Is now complaining of some evolving pain along the right lateral aspect of the elbow with associated pain into the right forearm, particular with excessive daily activities.  He is overall healthy and active at baseline.  Pertinent ROS were reviewed with the patient and found to be negative unless otherwise specified above in HPI.   Visit Reason: Right distal biceps injury with associated lateral epicondylitis Duration of symptoms:9 weeks-playing frisbee with dog Hand dominance: right Occupation: Team lead at [employer] Diabetic: No Smoking: Yes Heart/Lung History: none Blood Thinners: none  Prior Testing/EMG:xray and Mri in epic Injections (Date): none Treatments: Tried HEP Prior Surgery:none  Assessment & Plan: Visit Diagnoses:  1. Pain in right elbow   2. Rupture of right distal biceps tendon, initial encounter   3. Lateral epicondylitis, right elbow     Plan: Extensive discussion was had with the patient today regarding his right elbow injury.  I reviewed the results of his MRI in detail with him today which do show a partial-thickness  tear of the right biceps without notable tendon retraction or detachment.  He has maintained excellent elbow flexion and supination of the right elbow when tested today without significant pain.  He clearly has demonstrate appropriate nonoperative healing of this right elbow at this juncture.  Based on what he is told me from the activities and exercises that he looked up, he did appropriate nonoperative treatment for the right distal biceps tendon injury given that it is a partial tear.  He has developed some right lateral epicondylitis as well.  His MRI does show some partial tearing at the common extensor origin of the right elbow as well which correlates with his clinical examination today.  There is no significant instability in this region seen on examination today, there is some associated pain consistent with lateral epicondylitis.  We discussed the underlying etiology and pathophysiology of this condition as well as appropriate treatment modalities.  For the time being, I did recommend that utilize a counterforce brace.  He states that he does have one just has not started using it, explained to him how to apply this to the right forearm in order to help unload the common extensor origin of the right elbow for symptom relief moving forward.  He was also curious about the utilization of PRP in his healing process.  I will place a referral to Dr. Vaughn Georges in order to discuss this with him further.  I discussed this patient with Dr. Vaughn Georges personally as well.  We could also consider some shockwave therapy for him in the near  future in order to help facilitate his healing process.  I welcome to see him back as needed moving forward given his appropriate nonoperative improvement thus far.  I spent 35 minutes in the care of this patient today including review of previous documentation, imaging obtained, face-to-face time discussing all options regarding treatment, discussion with fellow physician and  documenting the encounter.    Follow-up: No follow-ups on file.   Meds & Orders: No orders of the defined types were placed in this encounter.   Orders Placed This Encounter  Procedures   Ambulatory referral to Orthopedic Surgery     Procedures: No procedures performed      Clinical History: No specialty comments available.  He reports that he has been smoking cigarettes. He has a 15 pack-year smoking history. He has never used smokeless tobacco. No results for input(s): HGBA1C, LABURIC in the last 8760 hours.  Objective:   Vital Signs: There were no vitals taken for this visit.  Physical Exam  Gen: Well-appearing, in no acute distress; non-toxic CV: Regular Rate. Well-perfused. Warm.  Resp: Breathing unlabored on room air; no wheezing. Psych: Fluid speech in conversation; appropriate affect; normal thought process  Ortho Exam PHYSICAL EXAM:  General: Patient is well appearing and in no distress.  Skin and Muscle: No significant skin changes are apparent to hands.  Muscle bulk and contour normal, no signs of atrophy.     Range of Motion and Palpation Tests: Mobility is full about the elbows with flexion and extension.  Forearm supination and pronation are 85/85 bilaterally.  Wrist flexion/extension is 75/65 bilaterally.  Digital flexion and extension are full.  Thumb opposition is full to the base of the small fingers bilaterally.    Right upper extremity able to perform active supination when isolated against resistance, no significant pain  Right lateral elbow with mild pain at the common extensor region, moderate pain elicited with resisted wrist extension  Neurologic, Vascular, Motor: Sensation is intact to light touch in the median/radial/ulnar distributions.  Tinel's testing negative at cubital and carpal tunnel.    Phalen's negative bilaterally, Derkan's compression negative bilaterally Fingers pink and well perfused.  Capillary refill is brisk.       Lab Results  Component Value Date   HGBA1C 5.3 01/11/2023     Imaging: No results found. Prior MRI of the right elbow was reviewed in detail today  Past Medical/Family/Surgical/Social History: Medications & Allergies reviewed per EMR, new medications updated. 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   Tobacco use 09/03/2015   Insomnia 09/03/2015   Past Medical History:  Diagnosis Date   Anxiety    Diverticulitis    Eczema 11/30/2018   Headache 11/30/2018   Hyperlipidemia    Low testosterone     Vitamin D  insufficiency    Family History  Problem Relation Age of Onset   Diabetes Mother    Heart disease Father        Heart attack   Past Surgical History:  Procedure Laterality Date   COLONOSCOPY WITH PROPOFOL  N/A 06/20/2022   Procedure: COLONOSCOPY WITH PROPOFOL ;  Surgeon: Vinetta Greening, DO;  Location: [facility];  Service: Endoscopy;  Laterality: N/A;  8:00am, asa 2   NOSE SURGERY     PARTIAL COLECTOMY N/A 09/28/2015   Procedure: PARTIAL COLECTOMY;  Surgeon: Alanda Allegra, MD;  Location: [facility];  Service: General;  Laterality: N/A;   POLYPECTOMY  06/20/2022   Procedure: POLYPECTOMY;  Surgeon: Vinetta Greening, DO;  Location: [facility];  Service: Endoscopy;;   Social History   Occupational History   Not on file  Tobacco Use   Smoking status: Every Day    Current packs/day: 0.50    Average packs/day: 0.5 packs/day for 30.0 years (15.0 ttl pk-yrs)    Types: Cigarettes   Smokeless tobacco: Never  Vaping Use   Vaping status: Never Used  Substance and Sexual Activity   Alcohol use: Yes    Comment: occ   Drug use: No   Sexual activity: Yes    Birth control/protection: None    Djimon Lundstrom Alvia Jointer, M.D. [facility],  Hand Surgery

## 2024-03-29 ENCOUNTER — Encounter: Payer: Self-pay | Admitting: Orthopedic Surgery

## 2024-04-02 ENCOUNTER — Ambulatory Visit (INDEPENDENT_AMBULATORY_CARE_PROVIDER_SITE_OTHER): Admitting: Sports Medicine

## 2024-04-02 DIAGNOSIS — M7712 Lateral epicondylitis, left elbow: Secondary | ICD-10-CM

## 2024-04-02 DIAGNOSIS — S46211D Strain of muscle, fascia and tendon of other parts of biceps, right arm, subsequent encounter: Secondary | ICD-10-CM

## 2024-04-02 DIAGNOSIS — M7711 Lateral epicondylitis, right elbow: Secondary | ICD-10-CM | POA: Diagnosis not present

## 2024-04-02 NOTE — Progress Notes (Unsigned)
 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 He says that this department is physically demanding, and he noticed some tightness and discomfort in his elbows as a result of that. He was then throwing a frisbee with his right arm in April when he felt and heard a pop. He has had an MRI since then and was advised to have a discussion about PRP injection with Dr. Burnetta. He would like to get back to his normal activity. He has been doing banded exercises on his own at home.

## 2024-04-02 NOTE — Progress Notes (Unsigned)
 Devin Ryan - 58 y.o. male MRN [id number]  Date of birth: 29-Jun-1966  Office Visit Note: Visit Date: 04/02/2024 PCP: Cathlene Marry Lenis, FNP Referred by: Arlinda Buster, MD  Subjective: Chief Complaint  Patient presents with   Right Elbow - Pain   HPI: Devin Ryan is a pleasant 58 y.o. male who presents today for evaluation of bilateral elbow pain and a partial distal biceps tear on the right. Initially had seen Dr. Brenna for these and had an MRI which confirmed about a 25-30% distal bicep partial tear without full-thickness tearing.  He was having right lateral epicondylitis but this has calmed down and he is feeling more symptoms in the left elbow.  All of this initiated after he was transitioned at his job with a lot of repetitive and heavy/physical labor.  His specific injury for the bicep injury was when he was throwing a [place].  He saw Dr. Erwin for this and did not believe there was surgical intervention needed at this time.  Referred here to me to consider shockwave therapy and also possible PRP injection therapy.  Devin Ryan is interested in PRP, however we would have to find the appropriate time as it would be difficult for him to take off time from work.  Pertinent ROS were reviewed with the patient and found to be negative unless otherwise specified above in HPI.   Assessment & Plan: Visit Diagnoses:  1. Lateral epicondylitis, left elbow   2. Rupture of right distal biceps tendon, subsequent encounter   3. Lateral epicondylitis, right elbow    Plan: Impression is right elbow partial tear of the distal biceps that is improving with time, stretching/home rehab and activity modification.  We discussed the role for PRP versus shockwave therapy.  For both this and his lateral epicondylitis, we did review the protocol for PRP and it would be somewhat difficult for him to take 1-2 weeks off from physical activity/work at this time.  Given this, we did proceed with  extracorporeal shockwave therapy for his distal bicep tendon tear as well as his acute left lateral epicondylitis.  MRI of the right elbow did show some mild interstitial tearing at the CET insertion, although no full-thickness tearing.  Given the complexity of his issues, I would like to have him see our OT, Nate Moore to build him a guided physical therapy regimen for both the distal bicep as well as his lateral epicondylopathies. He is agreeable to this plan.  He will schedule an appointment over the next 1-2 weeks for repeat shockwave and further evaluation.  We will consider about 3 treatments and then see what sort of cumulative benefit he has going forward before deciding on additional.  Follow-up: Return for 1-2 weeks for R-bicep, L-elbow for procs  Meds & Orders: No orders of the defined types were placed in this encounter.   Orders Placed This Encounter  Procedures   Ambulatory referral to Occupational Therapy     Procedures:  Procedure: ECSWT Indications:  Partial distal biceps tendon tear   Procedure Details Consent: Risks of procedure as well as the alternatives and risks of each were explained to the patient.  Verbal consent for procedure obtained. Time Out: Verified patient identification, verified procedure, site was marked, verified correct patient position. The area was cleaned with alcohol swab.     The right distal biceps tendon was targeted for Extracorporeal shockwave therapy.    Preset: Status post muscular injury/tendinitis Power Level: 100-110 mJ Frequency: 10-12 Hz Impulse/cycles:  2500 Head size: Regular  The left lateral epicondyle and CET was targeted for Extracorporeal shockwave therapy.    Preset: Lateral epicondylitis Power Level: 80-90 mJ Frequency: 10 HZ Impulse/cycles: 2000 Head size: Regular   Patient tolerated procedure well without immediate complications.       Clinical History: No specialty comments available.  He reports that he has  been smoking cigarettes. He has a 15 pack-year smoking history. He has never used smokeless tobacco. No results for input(s): HGBA1C, LABURIC in the last 8760 hours.  Objective:    Physical Exam  Gen: Well-appearing, in no acute distress; non-toxic CV: Well-perfused. Warm.  Resp: Breathing unlabored on room air; no wheezing. Psych: Fluid speech in conversation; appropriate affect; normal thought process  Ortho Exam - Bilateral elbows: + TTP at the distal bicep insertion on the right just proximal to the radial tuberosity.  No significant TTP over the right lateral epicondyle, but the left is quite tender with deep palpation.  Positive Maudsley's test, positive third finger extension.  Both elbows have full range of motion from flexion and extension.  Negative speeds test on the right.   Imaging:  *Independent review and interpretation of right elbow MRI from 02/12/2024 was performed by myself today.  MRI demonstrates a partial insertional tear of the distal biceps which spans between 20-30% of the tendon origin but there is no full-thickness tearing.  It is is hyperintense on T2 imaging.  There is also insertional common extensor and tendon origin interstitial tearing with a degree of epicondylitis as well.  Mild cartilage loss and elbow OA.  MR ELBOW RIGHT WO CONTRAST CLINICAL DATA:  Right elbow pain.  Chronic.  EXAM: MRI OF THE RIGHT ELBOW WITHOUT CONTRAST  TECHNIQUE: Multiplanar, multisequence MR imaging of the elbow was performed. No intravenous contrast was administered.  COMPARISON:  Right elbow radiographs 02/07/2024  FINDINGS: TENDONS  Common forearm flexor origin: Common flexor origin is intact.  Common forearm extensor origin: There is fluid bright signal at the deep aspect of the common extensor tendon, a mild-to-moderate partial-thickness tear measuring up to approximately 11 mm in AP dimension, 3 mm in transverse dimension, and 6 mm in craniocaudal dimension  (coronal series 6, image 14 and axial series 4, image 15).  Biceps: There is moderate intermediate T2 signal throughout the distal approximate 2.4 cm of the biceps tendon (sagittal series 8, image 14). There is partial-thickness tearing of approximately the distal 30% of the biceps tendon inserting fibers with minimal approximate 1.4 cm retraction of the torn tendon fibers (sagittal series 8, image 14). There is mild-to-moderate fluid surrounding the distal biceps tendon. A marker is seen overlying the volar elbow at the level of the proximal biceps tendon.  Triceps: The triceps tendon insertion is intact.  LIGAMENTS  Medial stabilizers: There is mild edema around the proximal ulnar collateral ligament, suggesting a mild sprain (coronal series 6, image 17 and axial images 16 and 17).  Lateral stabilizers: There appears to be partial-thickness tearing of the proximal radial collateral ligament adjacent to the common extensor tendon partial-thickness tear (coronal series 6 images 14 and 15). The more posterior radioulnar collateral ligament appears intact.  Cartilage: Intact.  Joint: There is mild thinning of the trochlear and coronoid process cartilage. Mild radial head cartilage thinning with mild subchondral cystic change within the volar medial aspect.  Cubital tunnel: Normal appearance of the ulnar nerve.  Normal course and signal of the radial and median nerves.  Bones: No acute fracture.  IMPRESSION: 1.  Mild-to-moderate partial-thickness tear of approximately 30% of the most distal biceps tendon inserting fibers with minimal 1.4 cm retraction of the torn tendon fibers. 2. Mild-to-moderate partial-thickness tear of the common extensor tendon origin. Partial-thickness tearing of the proximal radial collateral ligament adjacent to the common extensor tendon partial-thickness tear. 3. Mild sprain of the proximal ulnar collateral ligament. 4. Mild elbow cartilage  degenerative changes.  Electronically Signed   By: Tanda Lyons M.D.   On: 02/24/2024 22:51   Past Medical/Family/Surgical/Social History: Medications & Allergies reviewed per EMR, new medications updated. 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   Tobacco use 09/03/2015   Insomnia 09/03/2015   Past Medical History:  Diagnosis Date   Anxiety    Diverticulitis    Eczema 11/30/2018   Headache 11/30/2018   Hyperlipidemia    Low testosterone     Vitamin D  insufficiency    Family History  Problem Relation Age of Onset   Diabetes Mother    Heart disease Father        Heart attack   Past Surgical History:  Procedure Laterality Date   COLONOSCOPY WITH PROPOFOL  N/A 06/20/2022   Procedure: COLONOSCOPY WITH PROPOFOL ;  Surgeon: Cindie Carlin POUR, DO;  Location: [facility];  Service: Endoscopy;  Laterality: N/A;  8:00am, asa 2   NOSE SURGERY     PARTIAL COLECTOMY N/A 09/28/2015   Procedure: PARTIAL COLECTOMY;  Surgeon: Oneil Budge, MD;  Location: [facility];  Service: General;  Laterality: N/A;   POLYPECTOMY  06/20/2022   Procedure: POLYPECTOMY;  Surgeon: Cindie Carlin POUR, DO;  Location: [facility];  Service: Endoscopy;;   Social History   Occupational History   Not on file  Tobacco Use   Smoking status: Every Day    Current packs/day: 0.50    Average packs/day: 0.5 packs/day for 30.0 years (15.0 ttl pk-yrs)    Types: Cigarettes   Smokeless tobacco: Never  Vaping Use   Vaping status: Never Used  Substance and Sexual Activity   Alcohol use: Yes    Comment: occ   Drug use: No   Sexual activity: Yes    Birth control/protection: None

## 2024-04-03 ENCOUNTER — Encounter: Payer: Self-pay | Admitting: Sports Medicine

## 2024-04-15 NOTE — Therapy (Signed)
 OUTPATIENT OCCUPATIONAL THERAPY ORTHO EVALUATION  Patient Name: Devin Ryan MRN: [id number] DOB:1966/08/21, 58 y.o., male Today's Date: 04/16/2024  PCP: Cathlene MORTON FNP REFERRING PROVIDER:  Burnetta Brunet, DO    END OF SESSION:  OT End of Session - 04/16/24 1610     Visit Number 1    Number of Visits 8    Date for OT Re-Evaluation 05/31/24    Authorization Type BCBS    OT Start Time 1610    OT Stop Time 1652    OT Time Calculation (min) 42 min    Equipment Utilized During Treatment Compressive strap for distal bicep    Activity Tolerance Patient tolerated treatment well;No increased pain;Patient limited by fatigue;Patient limited by pain    Behavior During Therapy [facility] for tasks assessed/performed          Past Medical History:  Diagnosis Date   Anxiety    Diverticulitis    Eczema 11/30/2018   Headache 11/30/2018   Hyperlipidemia    Low testosterone     Vitamin D  insufficiency    Past Surgical History:  Procedure Laterality Date   COLONOSCOPY WITH PROPOFOL  N/A 06/20/2022   Procedure: COLONOSCOPY WITH PROPOFOL ;  Surgeon: Cindie Carlin POUR, DO;  [facility]: [facility];  Service: Endoscopy;  Laterality: N/A;  8:00am, asa 2   NOSE SURGERY     PARTIAL COLECTOMY N/A 09/28/2015   Procedure: PARTIAL COLECTOMY;  Surgeon: Oneil Budge, MD;  Location: [facility];  Service: General;  Laterality: N/A;   POLYPECTOMY  06/20/2022   Procedure: POLYPECTOMY;  Surgeon: Cindie Carlin POUR, DO;  [facility]: [facility];  Service: Endoscopy;;   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   Tobacco use 09/03/2015   Insomnia 09/03/2015    ONSET DATE: approx 51 month old onset of tear/pain, mainly in Lt arm   REFERRING DIAG:  M77.11 (ICD-10-CM) - Lateral epicondylitis, right  elbow  M77.12 (ICD-10-CM) - Lateral epicondylitis, left elbow S46.211D (ICD-10-CM) - Rupture of right distal biceps tendon, subsequent encounter     THERAPY DIAG:  Pain in left elbow  Pain in right elbow  Stiffness of left wrist, not elsewhere classified  Muscle weakness (generalized)  Other lack of coordination  Rationale for Evaluation and Treatment: Rehabilitation  SUBJECTIVE:   SUBJECTIVE STATEMENT: About 12 weeks since partial Rt biceps rupture.   He states playing frisbee injured the Rt elbow/bicps, he also has tightness in bil lateral elbows, esp the Lt side.  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   He states that his left elbow feels that it needs to pop.  He states that pain has gradually been going down as he has been resting more.  He is getting shockwave therapy from Dr. Burnetta periodically.  He enjoys working out but has been avoiding this while he has been healing.   PERTINENT HISTORY: Partial bicep tear right arm. L > R lateral epicondylitis.  PRECAUTIONS: None  RED FLAGS: None   WEIGHT BEARING RESTRICTIONS: Yes recommended weightbearing as tolerated but nothing heavy/painful in the right bicep for at least 2 or 3 more weeks.  PAIN:  Are you having pain? Yes: NPRS scale: 0-1/10 at rest and up to 3/10 at worst in past week Pain location: Left lateral epicondyle and right  biceps distal insertion Pain description: Only mildly achy recently Aggravating factors: Lifting with palm up and supination extending elbows Relieving factors: Heat self massage, etc.  FALLS: Has patient fallen in last 6 months? No   PLOF: Independent  PATIENT GOALS: To improve strength and mobility in both the right arm and elbow as well as the left lateral epicondyle  NEXT MD VISIT: As needed for shockwave therapy with Dr. Burnetta   OBJECTIVE: (All objective assessments below are from initial evaluation on: 7/8/5 unless otherwise  specified.)   HAND DOMINANCE: Right   ADLs: Overall ADLs: States decreased ability to grab, hold household objects, pain and difficulty to open containers, difficulty with exercise routines and lifting with the palm up.   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, Presenter, broadcasting, every day duties) (Higher Score  =  Better Ability for the Selected Tasks)      UPPER EXTREMITY ROM     Shoulder to Wrist AROM Right eval Left eval  Shoulder flexion    Shoulder abduction    Shoulder extension    Shoulder internal rotation    Shoulder external rotation    Elbow flexion 140 128  Elbow extension (+2) (-8)   Forearm supination 71 75  Forearm pronation  89 95  Wrist flexion 68 68  Wrist extension 58 63  Wrist ulnar deviation    Wrist radial deviation    Functional dart thrower's motion (F-DTM) in ulnar flexion    F-DTM in radial extension     (Blank rows = not tested)   Hand AROM Right eval Left eval  Full Fist Ability (or Gap to Distal Palmar Crease) Full fist Full fist  Thumb Opposition  (Kapandji Scale)  [facility] WFL  (Blank rows = not tested)   UPPER EXTREMITY MMT:     MMT Right 04/16/24 Left 04/16/24  Shoulder flexion    Shoulder abduction    Shoulder adduction    Shoulder extension    Shoulder internal rotation    Shoulder external rotation    Middle trapezius    Lower trapezius    Elbow flexion 4-/5 pain 5/5  Elbow extension    Forearm supination    Forearm pronation    Wrist flexion    Wrist extension 4+/5 slight tender 4+/5 slight tender   Wrist ulnar deviation    Wrist radial deviation    (Blank rows = not tested)  HAND FUNCTION: Eval: Observed weakness in affected Rt hand compared to the left hand Grip strength Right: 82 lbs, Left: 100 lbs   COORDINATION: Eval: Only some mild gross motor coordination problems now due to pain at the elbow with wrist and elbow and forearm motion, mildly bilaterally.  No fine motor  skill issues   SENSATION: Eval:  Light touch intact today, no complaints  EDEMA:   Eval: None significant today, though mildly red at the biceps insertion in the right arm after shockwave therapy earlier today  COGNITION: Eval: Overall cognitive status: WFL for evaluation today   OBSERVATIONS:   Eval: Slight irritation with resisted wrist extension test, more so on the left than the right.  Pain with gripping in the right arm and weightbearing through the biceps especially with supination.  Tender at the right biceps insertion and tender at the left lateral epicondyle.  Right distal biceps partial tear and mild lateral epicondylitis; left lateral epicondylitis   TODAY'S TREATMENT:  Post-evaluation treatment:   For safety/self-care he was asked to do no heavy lifting  especially with palm up and supination, over the next several weeks as we are working together.  He was told to use moist heat and self massage as a warm up, then perform the following exercises for nonpainful stretches to be done 3 or 4 times a day, at least 3 times each for 15 seconds.  They were each performed with him for understanding and to find a tolerable end position.  Additionally we discussed use of his counterforce brace and he shown how to manage this.  Additionally OT supplies him with a compressive strap that he can use over the distal biceps area to relieve pressure on the insertion.  He states it helps slightly.  He could also consider using an elbow compression sleeve.   Exercises - Fridge Door Stretch  - 3- 4 x daily - 3-5 reps - 15 sec hold - Triceps Stretch- Do on back of chair   - 3-4 x daily - 3-5 reps - 15 hold - Forearm Pronation Stretch  - 3-4 x daily - 3-5 reps - 15 sec hold - Seated Wrist Flexion Stretch  - 3-4 x daily - 3 reps - 15 second  hold  PATIENT EDUCATION: Education details: See tx section above for details  Person educated: Patient Education method: Verbal Instruction, Teach back,  Handouts  Education comprehension: States and demonstrates understanding, Additional Education required    HOME EXERCISE PROGRAM: Access Code: [email or web address] URL: [email or web address] Date: 04/16/2024 Prepared by: Melvenia Ada   GOALS: Goals reviewed with patient? Yes   SHORT TERM GOALS: (STG required if POC>30 days) Target Date: 04/26/2024  Pt will demo/state understanding of initial HEP to improve pain levels and prerequisite motion. Goal status: INITIAL   LONG TERM GOALS: Target Date: 05/31/2024  Pt will improve functional ability by decreased impairment per PSFS assessment from 6.6 to 8.6 or better, for better quality of life. Goal status: INITIAL  2.  Pt will improve grip strength in right hand from 82 lbs to at least 90 lbs for functional use at home and in IADLs. Goal status: INITIAL  3.  Pt will improve A/ROM in right wrist extension from 58 degrees to at least 66*, to have functional motion for tasks like reach and grasp.  Goal status: INITIAL  4.  Pt will improve strength in right bicep flexion from tender 4 -/5 MMT to at least 4+/5 MMT no significant pain to have increased functional ability to carry out selfcare and higher-level homecare tasks with less difficulty. Goal status: INITIAL  5.  Pt will decrease pain at worst from 3/10 to 1/10 or better to have better sleep and occupational participation in daily roles. Goal status: INITIAL   ASSESSMENT:  CLINICAL IMPRESSION: Patient is a 58 y.o. male who was seen today for occupational therapy evaluation for pain, stiffness, weakness in the right arm and elbow area after distal biceps partial tear and mild lateral epicondylitis as well as pain and stiffness in the left elbow from left lateral epicondylitis.  The patient will benefit from outpatient occupational therapy to decrease symptoms, improve functional upper extremity use, and increase quality of life.  PERFORMANCE DEFICITS: in functional skills  including IADLs, ROM, strength, pain, fascial restrictions, flexibility, Gross motor control, body mechanics, endurance, and UE functional use, cognitive skills including problem solving and safety awareness, and psychosocial skills including coping strategies, environmental adaptation, habits, and routines and behaviors.   IMPAIRMENTS: are limiting patient from ADLs, IADLs, rest and sleep, and leisure.   COMORBIDITIES: may have co-morbidities  that affects occupational performance. Patient will benefit from skilled OT to address above impairments and improve overall function.  MODIFICATION OR ASSISTANCE TO COMPLETE EVALUATION: Min-Moderate modification of tasks or assist with assess necessary to complete an evaluation.  OT OCCUPATIONAL PROFILE AND HISTORY: Detailed assessment: Review of records and additional review of physical, cognitive, psychosocial history related to current functional performance.  CLINICAL DECISION MAKING: Moderate - several treatment options, min-mod task modification necessary  REHAB POTENTIAL: Excellent  EVALUATION COMPLEXITY: Moderate      PLAN:  OT FREQUENCY: 1-2x/week  OT DURATION: 6 weeks through 05/31/2024 and up to 8 total visits as needed   PLANNED INTERVENTIONS: 97535 self care/ADL training, 02889 therapeutic exercise, 97530 therapeutic activity, 97112 neuromuscular re-education, 97140 manual therapy, 97035 ultrasound, 97032 electrical stimulation (manual), 97760 Orthotic Initial, [id number] Orthotic/Prosthetic subsequent, compression bandaging, Dry needling, energy conservation, coping strategies training, and patient/family education  RECOMMENDED OTHER SERVICES: none now    CONSULTED AND AGREED WITH PLAN OF CARE: Patient  PLAN FOR NEXT SESSION:   Review initial HEP and recommendations    Melvenia Ada, OTR/L, CHT  04/16/2024, 5:10 PM

## 2024-04-16 ENCOUNTER — Ambulatory Visit (INDEPENDENT_AMBULATORY_CARE_PROVIDER_SITE_OTHER): Admitting: Rehabilitative and Restorative Service Providers"

## 2024-04-16 ENCOUNTER — Encounter: Payer: Self-pay | Admitting: Rehabilitative and Restorative Service Providers"

## 2024-04-16 ENCOUNTER — Encounter: Payer: Self-pay | Admitting: Sports Medicine

## 2024-04-16 ENCOUNTER — Ambulatory Visit (INDEPENDENT_AMBULATORY_CARE_PROVIDER_SITE_OTHER): Admitting: Sports Medicine

## 2024-04-16 DIAGNOSIS — M7712 Lateral epicondylitis, left elbow: Secondary | ICD-10-CM | POA: Diagnosis not present

## 2024-04-16 DIAGNOSIS — R278 Other lack of coordination: Secondary | ICD-10-CM

## 2024-04-16 DIAGNOSIS — M25632 Stiffness of left wrist, not elsewhere classified: Secondary | ICD-10-CM

## 2024-04-16 DIAGNOSIS — M25521 Pain in right elbow: Secondary | ICD-10-CM

## 2024-04-16 DIAGNOSIS — M6281 Muscle weakness (generalized): Secondary | ICD-10-CM | POA: Diagnosis not present

## 2024-04-16 DIAGNOSIS — S46211D Strain of muscle, fascia and tendon of other parts of biceps, right arm, subsequent encounter: Secondary | ICD-10-CM

## 2024-04-16 DIAGNOSIS — M25522 Pain in left elbow: Secondary | ICD-10-CM

## 2024-04-16 NOTE — Progress Notes (Signed)
 Patient says that he is about 70-75% better after the first shockwave therapy. He says that he has been able to use his arms without much pain. 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 He did have soreness the next day, but is overall improved.

## 2024-04-16 NOTE — Progress Notes (Signed)
 Devin Ryan - 58 y.o. male MRN [id number]  Date of birth: September 20, 1966  Office Visit Note: Visit Date: 04/16/2024 PCP: Cathlene Marry Lenis, FNP (Inactive) Referred by: Cathlene Marry Lenis*  Subjective: Chief Complaint  Patient presents with   Right Elbow - Follow-up   Left Elbow - Follow-up   HPI: Devin Ryan is a pleasant 58 y.o. male who presents today for follow-up of left elbow epicondylopathy and right elbow partial biceps tear.  Back on 6/24 we did perform her first treatment of extracorporeal shockwave therapy, which [name] responded excellent to.  He feels he is about 70-75% improved from this therapy.  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  More still has some soreness.  He has returned to activity but has held on further weight lifting activity until further improved.  He does have his initial evaluation with occupational therapy, Spurgeon Ada, later today.  Pertinent ROS were reviewed with the patient and found to be negative unless otherwise specified above in HPI.   Assessment & Plan: Visit Diagnoses:  1. Lateral epicondylitis, left elbow   2. Rupture of right distal biceps tendon, subsequent encounter    Plan: Impression is improving bilateral elbow pain with patient about 3 months out from injury for partial distal biceps tear as well as left elbow lateral epicondylitis.  He received an excellent response to her first treatment of extracorporeal shockwave therapy, we did repeat this again today.  He will see our OT, Nate Moore to help build a guide to physical therapy regimen for both of these ailments.  I am okay with him returning back to activity but would hold on heavy concentric and eccentric bicep exercises.  Likely may begin isometric holds, but would appreciate guidance from OT.  I would like to see him back in about 2 weeks for an additional treatment of shockwave therapy and further evaluation, given his  improvement this may be able to be his last treatment, with then continued OT --> HEP.  Okay for over-the-counter anti-inflammatories only as needed.  Follow-up: Return in 2 weeks (on 04/30/2024) for f/u in next 10-14 days for b/l elbows (reg visit).   Meds & Orders: No orders of the defined types were placed in this encounter.  No orders of the defined types were placed in this encounter.    Procedures: Procedure: ECSWT Indications:  Partial distal biceps tendon tear   Procedure Details Consent: Risks of procedure as well as the alternatives and risks of each were explained to the patient.  Verbal consent for procedure obtained. Time Out: Verified patient identification, verified procedure, site was marked, verified correct patient position. The area was cleaned with alcohol swab.     The right distal biceps tendon was targeted for Extracorporeal shockwave therapy.    Preset: Status post muscular injury/tendinitis Power Level: 110 mJ Frequency: 12 Hz Impulse/cycles: 2400 Head size: Regular   The left lateral epicondyle and CET was targeted for Extracorporeal shockwave therapy.    Preset: Lateral epicondylitis Power Level: 90 mJ Frequency: 10 HZ Impulse/cycles: 2200 Head size: Regular   Patient tolerated procedure well without immediate complications.      Clinical History: No specialty comments available.  He reports that he has been smoking cigarettes. He has a 15 pack-year smoking history. He has never used smokeless tobacco. No results for input(s): HGBA1C, LABURIC in the last 8760 hours.  Objective:    Physical Exam  Gen: Well-appearing, in  no acute distress; non-toxic CV: Well-perfused. Warm.  Resp: Breathing unlabored on room air; no wheezing. Psych: Fluid speech in conversation; appropriate affect; normal thought process  Ortho Exam - Right elbow: No redness or swelling.  Mild TTP with deep palpation over the distal biceps insertion.  Able to feel the tendon  moved with supination/pronation effectively.  Intact hook test.  Left elbow: Mild TTP overlying the lateral epicondyle as well as some myofascial restriction at the CET and lateral elbow recess.  No joint effusion.  Full range of motion with flexion and extension equivocally bilaterally.  Imaging: No results found.  Past Medical/Family/Surgical/Social History: Medications & Allergies reviewed per EMR, new medications updated. 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   Tobacco use 09/03/2015   Insomnia 09/03/2015   Past Medical History:  Diagnosis Date   Anxiety    Diverticulitis    Eczema 11/30/2018   Headache 11/30/2018   Hyperlipidemia    Low testosterone     Vitamin D  insufficiency    Family History  Problem Relation Age of Onset   Diabetes Mother    Heart disease Father        Heart attack   Past Surgical History:  Procedure Laterality Date   COLONOSCOPY WITH PROPOFOL  N/A 06/20/2022   Procedure: COLONOSCOPY WITH PROPOFOL ;  Surgeon: Cindie Carlin POUR, DO;  Location: [facility];  Service: Endoscopy;  Laterality: N/A;  8:00am, asa 2   NOSE SURGERY     PARTIAL COLECTOMY N/A 09/28/2015   Procedure: PARTIAL COLECTOMY;  Surgeon: Oneil Budge, MD;  Location: [facility];  Service: General;  Laterality: N/A;   POLYPECTOMY  06/20/2022   Procedure: POLYPECTOMY;  Surgeon: Cindie Carlin POUR, DO;  Location: [facility];  Service: Endoscopy;;   Social History   Occupational History   Not on file  Tobacco Use   Smoking status: Every Day    Current packs/day: 0.50    Average packs/day: 0.5 packs/day for 30.0 years (15.0 ttl pk-yrs)    Types: Cigarettes   Smokeless tobacco: Never  Vaping Use   Vaping status: Never Used  Substance and Sexual Activity   Alcohol  use: Yes    Comment: occ   Drug use: No   Sexual activity: Yes    Birth control/protection: None

## 2024-04-24 NOTE — Therapy (Signed)
 OUTPATIENT OCCUPATIONAL THERAPY TREATMENT NOTE   Patient Name: Devin Ryan MRN: [id number] DOB:1966/04/19, 58 y.o., male Today's Date: 04/25/2024  PCP: Cathlene MORTON FNP REFERRING PROVIDER:  Burnetta Brunet, DO    END OF SESSION:  OT End of Session - 04/25/24 1604     Visit Number 2    Number of Visits 8    Date for OT Re-Evaluation 05/31/24    Authorization Type BCBS    OT Start Time 1604    OT Stop Time 1644    OT Time Calculation (min) 40 min    Equipment Utilized During Treatment --    Activity Tolerance Patient tolerated treatment well;No increased pain;Patient limited by fatigue    Behavior During Therapy [facility] for tasks assessed/performed           Past Medical History:  Diagnosis Date   Anxiety    Diverticulitis    Eczema 11/30/2018   Headache 11/30/2018   Hyperlipidemia    Low testosterone     Vitamin D  insufficiency    Past Surgical History:  Procedure Laterality Date   COLONOSCOPY WITH PROPOFOL  N/A 06/20/2022   Procedure: COLONOSCOPY WITH PROPOFOL ;  Surgeon: Cindie Carlin POUR, DO;  [facility];  Service: Endoscopy;  Laterality: N/A;  8:00am, asa 2   NOSE SURGERY     PARTIAL COLECTOMY N/A 09/28/2015   Procedure: PARTIAL COLECTOMY;  Surgeon: Oneil Budge, MD;  Location: [facility];  Service: General;  Laterality: N/A;   POLYPECTOMY  06/20/2022   Procedure: POLYPECTOMY;  Surgeon: Cindie Carlin POUR, DO;  [facility];  Service: Endoscopy;;   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   Tobacco use 09/03/2015   Insomnia 09/03/2015    ONSET DATE: approx 52 month old onset of tear/pain, mainly in Lt arm   REFERRING DIAG:  M77.11 (ICD-10-CM) - Lateral epicondylitis, right elbow  M77.12 (ICD-10-CM) - Lateral epicondylitis,  left elbow S46.211D (ICD-10-CM) - Rupture of right distal biceps tendon, subsequent encounter     THERAPY DIAG:  Pain in left elbow  Pain in right elbow  Stiffness of left wrist, not elsewhere classified  Muscle weakness (generalized)  Other lack of coordination  Rationale for Evaluation and Treatment: Rehabilitation  PERTINENT HISTORY: Partial bicep tear right arm. L > R lateral epicondylitis. He states playing frisbee injured the Rt elbow/bicps, he also has tightness in bil lateral elbows, esp the Lt side.  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   He states that his left elbow feels that it needs to pop.  He states that pain has gradually been going down as he has been resting more.  He is getting shockwave therapy from Dr. Burnetta periodically.  He enjoys working out but has been avoiding this while he has been healing.   PRECAUTIONS: None  RED FLAGS: None   WEIGHT BEARING RESTRICTIONS: Yes recommended weightbearing as tolerated but nothing heavy/painful in the right bicep for at least 2 or 3 more weeks.    SUBJECTIVE:   SUBJECTIVE STATEMENT: About 13 weeks since partial Rt biceps rupture.   He states recently he had to cover at work for being short staffed and do heavy and repetitive heavy tasks again.  Fortunately, he was wearing supportive braces and the bicep strap  that was given to him and he states not having a major exacerbation.  He does state that the next day he was very sore, however that did fade away throughout the day.  He is now not having any significant pain at rest..     PAIN:  Are you having pain? Yes: NPRS scale:  none at rest now  Pain location: Left lateral epicondyle and right biceps distal insertion Pain description: Only mildly achy recently Aggravating factors: Lifting with palm up and supination extending elbows Relieving factors: Heat self massage, etc.   PATIENT GOALS: To improve strength and  mobility in both the right arm and elbow as well as the left lateral epicondyle  NEXT MD VISIT: As needed for shockwave therapy with Dr. Burnetta   OBJECTIVE: (All objective assessments below are from initial evaluation on: 7/8/5 unless otherwise specified.)   HAND DOMINANCE: Right   ADLs: Overall ADLs: States decreased ability to grab, hold household objects, pain and difficulty to open containers, difficulty with exercise routines and lifting with the palm up.   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, Presenter, broadcasting, every day duties) (Higher Score  =  Better Ability for the Selected Tasks)      UPPER EXTREMITY ROM     Shoulder to Wrist AROM Right eval Left eval Rt / Lt  04/25/24  Shoulder flexion     Shoulder abduction     Shoulder extension     Shoulder internal rotation     Shoulder external rotation     Elbow flexion 140 128 144 / 139  Elbow extension (+2) (-8)  0 / (-6)  Forearm supination 71 75 70 / 75  Forearm pronation  89 95 Approx 90 bil   Wrist flexion 68 68 68 / 62  Wrist extension 58 63 58 / 68  Wrist ulnar deviation     Wrist radial deviation     Functional dart thrower's motion (F-DTM) in ulnar flexion     F-DTM in radial extension      (Blank rows = not tested)   Hand AROM Right eval Left eval  Full Fist Ability (or Gap to Distal Palmar Crease) Full fist Full fist  Thumb Opposition  (Kapandji Scale)  [facility] WFL  (Blank rows = not tested)   UPPER EXTREMITY MMT:     MMT Right 04/16/24 Left 04/16/24  Shoulder flexion    Shoulder abduction    Shoulder adduction    Shoulder extension    Shoulder internal rotation    Shoulder external rotation    Middle trapezius    Lower trapezius    Elbow flexion 4-/5 pain 5/5  Elbow extension    Forearm supination    Forearm pronation    Wrist flexion    Wrist extension 4+/5 slight tender 4+/5 slight tender   Wrist ulnar deviation    Wrist radial deviation    (Blank  rows = not tested)  HAND FUNCTION: 04/25/24 grip Rt: 102#   Eval: Observed weakness in affected Rt hand compared to the left hand Grip strength Right: 82 lbs, Left: 100 lbs   COORDINATION: Eval: Only some mild gross motor coordination problems now due to pain at the elbow with wrist and elbow and forearm motion, mildly bilaterally.  No fine motor skill issues  OBSERVATIONS:   Eval: Slight irritation with resisted wrist extension test, more so on the left than the right.  Pain with gripping in the right arm and weightbearing through the  biceps especially with supination.  Tender at the right biceps insertion and tender at the left lateral epicondyle.  Right distal biceps partial tear and mild lateral epicondylitis; left lateral epicondylitis   TODAY'S TREATMENT:  04/25/24:  He starts with active range of motion for new measures which shows improvement at bilateral elbows today and some improvement with the left wrist.  He is encouraged to keep stretching 3-4 times a day if possible.  We reviewed safety precautions and recommendations for not heavy lifting, etc.  He seems to be doing well with his supportive bracing options.  Next, as he is doing well and his grip strength is significantly improved in the right hand, OT upgrades him to light eccentric bicep training and pronation and/or supination dynamic eccentric activities to target medial and/or lateral epicondyles of right and left arms.  He is also given a hammer exercise with a 1 to 3 pound hammer to be done without pain for supination and pronation eccentrically.  These are done with him, he states tolerating this well with no significant pain, just some fatigue.  Right now he is not scheduled for an additional 2 weeks which is appropriate to let him work on these, but he was cautioned to not push himself into pain and to stop strengthening if he is feeling pain.  Exercises - Fridge Door Stretch  - 3- 4 x daily - 3-5 reps - 15 sec hold -  Triceps Stretch- Do on back of chair   - 3-4 x daily - 3-5 reps - 15 hold - Forearm Supination Stretch  - 3-4 x daily - 3-5 reps - 15 sec hold - Seated Wrist Flexion Stretch  - 3-4 x daily - 3 reps - 15 second  hold - Seated Wrist Extension Stretch  - 3-6 x daily - 3-5 reps - 15 hold - Eccentric Bicep Strength  - 2-4 x daily - 1 sets - 5-10 reps - 10 sec hold - ECCENTRIC HAMMER (SLOWLY LET DOWN)   - 2-3 x daily - 1-2 sets - 10-15 reps  PATIENT EDUCATION: Education details: See tx section above for details  Person educated: Patient Education method: Engineer, structural, Teach back, Handouts  Education comprehension: States and demonstrates understanding, Additional Education required    HOME EXERCISE PROGRAM: Access Code: [email or web address] URL: [email or web address] Date: 04/16/2024 Prepared by: Melvenia Ada   GOALS: Goals reviewed with patient? Yes   SHORT TERM GOALS: (STG required if POC>30 days) Target Date: 04/26/2024  Pt will demo/state understanding of initial HEP to improve pain levels and prerequisite motion. Goal status: INITIAL   LONG TERM GOALS: Target Date: 05/31/2024  Pt will improve functional ability by decreased impairment per PSFS assessment from 6.6 to 8.6 or better, for better quality of life. Goal status: INITIAL  2.  Pt will improve grip strength in right hand from 82 lbs to at least 90 lbs for functional use at home and in IADLs. Goal status: INITIAL  3.  Pt will improve A/ROM in right wrist extension from 58 degrees to at least 66*, to have functional motion for tasks like reach and grasp.  Goal status: INITIAL  4.  Pt will improve strength in right bicep flexion from tender 4 -/5 MMT to at least 4+/5 MMT no significant pain to have increased functional ability to carry out selfcare and higher-level homecare tasks with less difficulty. Goal status: INITIAL  5.  Pt will decrease pain at worst from 3/10 to 1/10 or better to have better sleep and  occupational participation in daily roles. Goal status: INITIAL   ASSESSMENT:  CLINICAL IMPRESSION: 04/25/24: Doing much better, tolerating eccentric strengthening now, needs to be cautious at work with doing heavy tasks  Eval: Patient is a 58 y.o. male who was seen today for occupational therapy evaluation for pain, stiffness, weakness in the right arm and elbow area after distal biceps partial tear and mild lateral epicondylitis as well as pain and stiffness in the left elbow from left lateral epicondylitis.  The patient will benefit from outpatient occupational therapy to decrease symptoms, improve functional upper extremity use, and increase quality of life.    PLAN:  OT FREQUENCY: 1-2x/week  OT DURATION: 6 weeks through 05/31/2024 and up to 8 total visits as needed   PLANNED INTERVENTIONS: 97535 self care/ADL training, 02889 therapeutic exercise, 97530 therapeutic activity, 97112 neuromuscular re-education, 97140 manual therapy, 97035 ultrasound, 97032 electrical stimulation (manual), 97760 Orthotic Initial, [id number] Orthotic/Prosthetic subsequent, compression bandaging, Dry needling, energy conservation, coping strategies training, and patient/family education  CONSULTED AND AGREED WITH PLAN OF CARE: Patient  PLAN FOR NEXT SESSION:   Check back in about 2 weeks to determine his strength and his mobility, and any remaining issues, try dry needling for any lingering lateral epicondylitis.  May be appropriate for discharge at that visit or in a subsequent 1-2 visits as needed.   Melvenia Ada, OTR/L, CHT  04/25/2024, 4:51 PM

## 2024-04-25 ENCOUNTER — Encounter: Payer: Self-pay | Admitting: Rehabilitative and Restorative Service Providers"

## 2024-04-25 ENCOUNTER — Ambulatory Visit (INDEPENDENT_AMBULATORY_CARE_PROVIDER_SITE_OTHER): Admitting: Rehabilitative and Restorative Service Providers"

## 2024-04-25 DIAGNOSIS — M25522 Pain in left elbow: Secondary | ICD-10-CM | POA: Diagnosis not present

## 2024-04-25 DIAGNOSIS — R278 Other lack of coordination: Secondary | ICD-10-CM

## 2024-04-25 DIAGNOSIS — M6281 Muscle weakness (generalized): Secondary | ICD-10-CM

## 2024-04-25 DIAGNOSIS — M25521 Pain in right elbow: Secondary | ICD-10-CM | POA: Diagnosis not present

## 2024-04-25 DIAGNOSIS — M25632 Stiffness of left wrist, not elsewhere classified: Secondary | ICD-10-CM | POA: Diagnosis not present

## 2024-05-07 ENCOUNTER — Encounter: Admitting: Rehabilitative and Restorative Service Providers"

## 2024-05-10 ENCOUNTER — Ambulatory Visit: Admitting: Sports Medicine

## 2024-05-14 ENCOUNTER — Ambulatory Visit (INDEPENDENT_AMBULATORY_CARE_PROVIDER_SITE_OTHER): Admitting: Rehabilitative and Restorative Service Providers"

## 2024-05-14 ENCOUNTER — Encounter: Payer: Self-pay | Admitting: Sports Medicine

## 2024-05-14 ENCOUNTER — Ambulatory Visit (INDEPENDENT_AMBULATORY_CARE_PROVIDER_SITE_OTHER): Admitting: Sports Medicine

## 2024-05-14 ENCOUNTER — Encounter: Payer: Self-pay | Admitting: Rehabilitative and Restorative Service Providers"

## 2024-05-14 DIAGNOSIS — M7712 Lateral epicondylitis, left elbow: Secondary | ICD-10-CM | POA: Diagnosis not present

## 2024-05-14 DIAGNOSIS — M25521 Pain in right elbow: Secondary | ICD-10-CM

## 2024-05-14 DIAGNOSIS — M25522 Pain in left elbow: Secondary | ICD-10-CM

## 2024-05-14 DIAGNOSIS — M25632 Stiffness of left wrist, not elsewhere classified: Secondary | ICD-10-CM | POA: Diagnosis not present

## 2024-05-14 DIAGNOSIS — R278 Other lack of coordination: Secondary | ICD-10-CM

## 2024-05-14 DIAGNOSIS — S46211D Strain of muscle, fascia and tendon of other parts of biceps, right arm, subsequent encounter: Secondary | ICD-10-CM

## 2024-05-14 DIAGNOSIS — M6281 Muscle weakness (generalized): Secondary | ICD-10-CM

## 2024-05-14 NOTE — Progress Notes (Signed)
 Devin Ryan - 58 y.o. male MRN [id number]  Date of birth: Oct 19, 1965  Office Visit Note: Visit Date: 05/14/2024 PCP: Cathlene Marry Lenis, FNP (Inactive) Referred by: No ref. provider found  Subjective: Chief Complaint  Patient presents with   Right Elbow - Follow-up   Left Elbow - Follow-up   HPI: Devin Ryan is a pleasant 58 y.o. male who presents today for follow-up of left lateral elbow epicondyle apathy and prior right elbow partial biceps distal tendon tear.  Right distal bicep -initial injury was 3-1/2 months ago.  In general his bicep is doing much better.  He did have some mild soreness when he was lifting up 30 pound boxes at work the other day, although after a few days to a week the pain completely resolved.  He is seeing Spurgeon Ada for OT and does have a bicep ring brace and elbow sleeve.  It is doing some home exercises as well.  Left elbow/LE -this is the most bothersome for him.  It is not preventing him from doing what he needs to do but he feels pain and some discomfort like something needs to pop directly over the lateral epicondyle.  He is using over-the-counter NSAIDs as needed.  Pertinent ROS were reviewed with the patient and found to be negative unless otherwise specified above in HPI.   Assessment & Plan: Visit Diagnoses:  1. Rupture of right distal biceps tendon, subsequent encounter   2. Lateral epicondylitis, left elbow    Plan: Impression is vastly improved partial distal biceps tendon tear on the right elbow.  He has responded well to 2 treatments of extracorporeal shockwave therapy, we did repeat a third and final treatment today.  At this point I would like him to continue progressing through his PT and his additional treatment with Spurgeon Ada, OT, but then I would like him to get started back into both concentric and eccentric bicep exercises.  Likely full return beginning 1 September, but he will start this on a progressive fashion.  In  terms of the left elbow, he has not received good relief from shockwave therapy and is still has tenderness directly over the epicondyle.  He will progress through OT/PT, may take over-the-counter anti-inflammatories as needed, but we discussed trialing an injection under ultrasound guidance in that area if needed.  Would likely want to start with elbow x-rays to evaluate the bony quality and joint space prior to this.  Meds & Orders: No orders of the defined types were placed in this encounter.  No orders of the defined types were placed in this encounter.    Procedures: Procedure: ECSWT Indications:  Partial distal biceps tendon tear   Procedure Details Consent: Risks of procedure as well as the alternatives and risks of each were explained to the patient.  Verbal consent for procedure obtained. Time Out: Verified patient identification, verified procedure, site was marked, verified correct patient position. The area was cleaned with alcohol swab.     The right distal biceps tendon was targeted for Extracorporeal shockwave therapy.    Preset: Status post muscular injury/tendinitis Power Level: 110 mJ Frequency: 12 Hz Impulse/cycles: 3400 Head size: Regular      Clinical History: No specialty comments available.  He reports that he has been smoking cigarettes. He has a 15 pack-year smoking history. He has never used smokeless tobacco. No results for input(s): HGBA1C, LABURIC in the last 8760 hours.  Objective:    Physical Exam  Gen: Well-appearing, in  no acute distress; non-toxic CV: Well-perfused. Warm.  Resp: Breathing unlabored on room air; no wheezing. Psych: Fluid speech in conversation; appropriate affect; normal thought process  Ortho Exam - Right biceps/elbow: No redness swelling.  No tenderness over the distal bicep today.  Intact hook test.   - Left elbow: Positive TTP over the lateral epicondyle.  Pain with resisted wrist extension and Maudsley's test.  There is  full range of motion with flexion and extension about the elbow joint without effusion.  Imaging: No results found.  Past Medical/Family/Surgical/Social History: Medications & Allergies reviewed per EMR, new medications updated. 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   Tobacco use 09/03/2015   Insomnia 09/03/2015   Past Medical History:  Diagnosis Date   Anxiety    Diverticulitis    Eczema 11/30/2018   Headache 11/30/2018   Hyperlipidemia    Low testosterone     Vitamin D  insufficiency    Family History  Problem Relation Age of Onset   Diabetes Mother    Heart disease Father        Heart attack   Past Surgical History:  Procedure Laterality Date   COLONOSCOPY WITH PROPOFOL  N/A 06/20/2022   Procedure: COLONOSCOPY WITH PROPOFOL ;  Surgeon: Cindie Carlin POUR, DO;  [facility];  Service: Endoscopy;  Laterality: N/A;  8:00am, asa 2   NOSE SURGERY     PARTIAL COLECTOMY N/A 09/28/2015   Procedure: PARTIAL COLECTOMY;  Surgeon: Oneil Budge, MD;  Location: [facility];  Service: General;  Laterality: N/A;   POLYPECTOMY  06/20/2022   Procedure: POLYPECTOMY;  Surgeon: Cindie Carlin POUR, DO;  [facility];  Service: Endoscopy;;   Social History   Occupational History   Not on file  Tobacco Use   Smoking status: Every Day    Current packs/day: 0.50    Average packs/day: 0.5 packs/day for 30.0 years (15.0 ttl pk-yrs)    Types: Cigarettes   Smokeless tobacco: Never  Vaping Use   Vaping status: Never Used  Substance and Sexual Activity   Alcohol use: Yes    Comment: occ   Drug use: No   Sexual activity: Yes    Birth control/protection: None

## 2024-05-14 NOTE — Progress Notes (Signed)
 Patient says that he is overall about the same as he was at his last visit. He says that he had a set back at work last week when lifting a box. He had about one week of aching in the arm, but that has since recovered some, and he now feels about the same as he did prior to that incident at work. He has been to OT twice and has another appointment today. He says that those appointments are going well so far.

## 2024-05-14 NOTE — Therapy (Signed)
 OUTPATIENT OCCUPATIONAL THERAPY TREATMENT NOTE   Patient Name: Devin Ryan MRN: [id number] DOB:August 04, 1966, 58 y.o., male Today's Date: 05/14/2024  PCP: Cathlene MORTON FNP REFERRING PROVIDER:  Burnetta Brunet, DO    END OF SESSION:  OT End of Session - 05/14/24 1011     Visit Number 3    Number of Visits 8    Date for OT Re-Evaluation 05/31/24    Authorization Type BC[employer]    OT Start Time 1014    OT Stop Time 1055    OT Time Calculation (min) 41 min    Activity Tolerance Patient tolerated treatment well;No increased pain;Patient limited by fatigue    Behavior During Therapy [facility] for tasks assessed/performed           Past Medical History:  Diagnosis Date   Anxiety    Diverticulitis    Eczema 11/30/2018   Headache 11/30/2018   Hyperlipidemia    Low testosterone     Vitamin D  insufficiency    Past Surgical History:  Procedure Laterality Date   COLONOSCOPY WITH PROPOFOL  N/A 06/20/2022   Procedure: COLONOSCOPY WITH PROPOFOL ;  Surgeon: Cindie Carlin POUR, DO;  Location: [facility];  Service: Endoscopy;  Laterality: N/A;  8:00am, asa 2   NOSE SURGERY     PARTIAL COLECTOMY N/A 09/28/2015   Procedure: PARTIAL COLECTOMY;  Surgeon: Oneil Budge, MD;  Location: [facility];  Service: General;  Laterality: N/A;   POLYPECTOMY  06/20/2022   Procedure: POLYPECTOMY;  Surgeon: Cindie Carlin POUR, DO;  Location: [facility];  Service: Endoscopy;;   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   Tobacco use 09/03/2015   Insomnia 09/03/2015    ONSET DATE: approx 71 month old onset of tear/pain, mainly in Lt arm   REFERRING DIAG:  M77.11 (ICD-10-CM) - Lateral epicondylitis, right elbow  M77.12 (ICD-10-CM) - Lateral epicondylitis, left elbow S46.211D (ICD-10-CM) - Rupture of  right distal biceps tendon, subsequent encounter     THERAPY DIAG:  Pain in left elbow  Pain in right elbow  Stiffness of left wrist, not elsewhere classified  Muscle weakness (generalized)  Other lack of coordination  Rationale for Evaluation and Treatment: Rehabilitation  PERTINENT HISTORY: Partial bicep tear right arm. L > R lateral epicondylitis. He states playing frisbee injured the Rt elbow/bicps, he also has tightness in bil lateral elbows, esp the Lt side.  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   He states that his left elbow feels that it needs to pop.  He states that pain has gradually been going down as he has been resting more.  He is getting shockwave therapy from Dr. Burnetta periodically.  He enjoys working out but has been avoiding this while he has been healing.   PRECAUTIONS: None  RED FLAGS: None   WEIGHT BEARING RESTRICTIONS: Yes recommended weightbearing as tolerated but nothing heavy/painful in the right bicep for at least 2 or 3 more weeks.    SUBJECTIVE:   SUBJECTIVE STATEMENT: About 15 weeks since partial Rt biceps rupture.   He states the first week went well, but had exacerbation at work last Monday. It did resolve but he's also weed eating, mowing, etc.  Lt lat elbow is worst symptom now... difficult finding stretch for it.  He does  enjoy his known stretches     PAIN:  Are you having pain? Yes: NPRS scale:  none at rest now  Pain location: Left lateral epicondyle and right biceps distal insertion Pain description: Only mildly achy recently Aggravating factors: Lifting with palm up and supination extending elbows Relieving factors: Heat self massage, etc.   PATIENT GOALS: To improve strength and mobility in both the right arm and elbow as well as the left lateral epicondyle  NEXT MD VISIT: As needed for shockwave therapy with Dr. Burnetta   OBJECTIVE: (All objective assessments below are from  initial evaluation on: 7/8/5 unless otherwise specified.)   HAND DOMINANCE: Right   ADLs: Overall ADLs: States decreased ability to grab, hold household objects, pain and difficulty to open containers, difficulty with exercise routines and lifting with the palm up.   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, Presenter, broadcasting, every day duties) (Higher Score  =  Better Ability for the Selected Tasks)      UPPER EXTREMITY ROM     Shoulder to Wrist AROM Right eval Left eval Rt / Lt  04/25/24 Rt   /  Lt  05/14/24  Shoulder flexion      Shoulder abduction      Shoulder extension      Shoulder internal rotation    44  Shoulder external rotation    83  Elbow flexion 140 128 144 / 139   Elbow extension (+2) (-8)  0 / (-6)   Forearm supination 71 75 70 / 75 71   / 72  Forearm pronation  89 95 Approx 90 bil    Wrist flexion 68 68 68 / 62 65  / 69  Wrist extension 58 63 58 / 68 68  / 70  Wrist ulnar deviation      Wrist radial deviation      Functional dart thrower's motion (F-DTM) in ulnar flexion      F-DTM in radial extension       (Blank rows = not tested)   Hand AROM Right eval Left eval  Full Fist Ability (or Gap to Distal Palmar Crease) Full fist Full fist  Thumb Opposition  (Kapandji Scale)  [facility] WFL  (Blank rows = not tested)   UPPER EXTREMITY MMT:     MMT Right 04/16/24 Left 04/16/24  Shoulder flexion    Shoulder abduction    Shoulder adduction    Shoulder extension    Shoulder internal rotation    Shoulder external rotation    Middle trapezius    Lower trapezius    Elbow flexion 4-/5 pain 5/5  Elbow extension    Forearm supination    Forearm pronation    Wrist flexion    Wrist extension 4+/5 slight tender 4+/5 slight tender   Wrist ulnar deviation    Wrist radial deviation    (Blank rows = not tested)  HAND FUNCTION: 04/25/24 grip Rt: 102#   Eval: Observed weakness in affected Rt hand compared to the left hand Grip  strength Right: 82 lbs, Left: 100 lbs   COORDINATION: Eval: Only some mild gross motor coordination problems now due to pain at the elbow with wrist and elbow and forearm motion, mildly bilaterally.  No fine motor skill issues  OBSERVATIONS:   Eval: Slight irritation with resisted wrist extension test, more so on the left than the right.  Pain with gripping in the right arm and weightbearing through the biceps especially with supination.  Tender at  the right biceps insertion and tender at the left lateral epicondyle.  Right distal biceps partial tear and mild lateral epicondylitis; left lateral epicondylitis   TODAY'S TREATMENT:  05/14/24: He starts with active range of motion for exercise as well as new measures which shows great improvements in bilateral wrist extension though some lingering stiffness through the arm does remain.  Overall his tenderness is better, but we review his home exercises to manage the right bicep and medial epicondyle area as well as the left lateral epicondyle area and tricep.  OT does provide new exercises as bolded below including sleeper stretch that tends to work on the sore tricep on the left side going into the lateral epicondyle as well as isometric wrist extension with elbow extension to perform a contract relax activity to help loosen up the tightness he feels around the left elbow.  The left elbow is his biggest pain and problem now per self reports.  He tolerated these activities and new stretches well today and states understanding his other home exercises and modifying them to achieve the best stretches.  Light eccentric strengthening has been going well with a hammer and light weight.  He was told to be cautious for the next month, continue stretches and light eccentric strengthening as well as new exercises and activities given out today.  He would like to follow-up in 3 or 4 weeks to determine his progress towards goals.  We will do a progress note at that point  in discharge if he is happy and functional otherwise we may choose to continue therapy as needed.   Exercises reviewed/performed/educated on today - Fridge Door Stretch  - 3- 4 x daily - 3-5 reps - 15 sec hold - Triceps Stretch- Do on back of chair   - 3-4 x daily - 3-5 reps - 15 hold - Forearm Supination Stretch  - 3-4 x daily - 3-5 reps - 15 sec hold - Seated Wrist Flexion Stretch  - 3-4 x daily - 3 reps - 15 second  hold - Seated Wrist Extension Stretch  - 3-6 x daily - 3-5 reps - 15 hold - Eccentric Bicep Strength  - 2-4 x daily - 1 sets - 5-10 reps - 10 sec hold - ECCENTRIC HAMMER (SLOWLY LET DOWN)   - 2-3 x daily - 1-2 sets - 10-15 reps - Sleeper Stretch  - 3-4 x daily - 3-5 reps - 15-20 sec hold - Seated Isometric Elbow Extension  - 2-3 x daily - 4-5 x weekly - 5-10 reps - 10 sec hold   PATIENT EDUCATION: Education details: See tx section above for details  Person educated: Patient Education method: Engineer, structural, Teach back, Handouts  Education comprehension: States and demonstrates understanding, Additional Education required    HOME EXERCISE PROGRAM: Access Code: [email or web address] URL: [email or web address] Date: 04/16/2024 Prepared by: Melvenia Ada   GOALS: Goals reviewed with patient? Yes   SHORT TERM GOALS: (STG required if POC>30 days) Target Date: 04/26/2024  Pt will demo/state understanding of initial HEP to improve pain levels and prerequisite motion. Goal status: 05/14/2024: Met   LONG TERM GOALS: Target Date: 05/31/2024  Pt will improve functional ability by decreased impairment per PSFS assessment from 6.6 to 8.6 or better, for better quality of life. Goal status: INITIAL  2.  Pt will improve grip strength in right hand from 82 lbs to at least 90 lbs for functional use at home and in IADLs. Goal status: 04/25/2024: Met  3.  Pt will improve  A/ROM in right wrist extension from 58 degrees to at least 66*, to have functional motion for tasks  like reach and grasp.  Goal status: 05/14/2024: Met  4.  Pt will improve strength in right bicep flexion from tender 4 -/5 MMT to at least 4+/5 MMT no significant pain to have increased functional ability to carry out selfcare and higher-level homecare tasks with less difficulty. Goal status: INITIAL  5.  Pt will decrease pain at worst from 3/10 to 1/10 or better to have better sleep and occupational participation in daily roles. Goal status: INITIAL   ASSESSMENT:  CLINICAL IMPRESSION: 05/14/24: He has met some of his long-term goals and is showing improvements.  He has new ideas for stretching and strengthening today which should help with his lingering left lateral epicondyle issues.  We will follow-up in about 3 to 4 weeks to determine effectiveness of his HEP, need for any additional therapy services.  He may choose to try some dry needling at that time if he has any lingering symptoms.  PLAN:  OT FREQUENCY: 1-2x/week  OT DURATION: 6 weeks through 05/31/2024 and up to 8 total visits as needed   PLANNED INTERVENTIONS: 97535 self care/ADL training, 02889 therapeutic exercise, 97530 therapeutic activity, 97112 neuromuscular re-education, 97140 manual therapy, 97035 ultrasound, 97032 electrical stimulation (manual), 97760 Orthotic Initial, [id number] Orthotic/Prosthetic subsequent, compression bandaging, Dry needling, energy conservation, coping strategies training, and patient/family education  CONSULTED AND AGREED WITH PLAN OF CARE: Patient  PLAN FOR NEXT SESSION:   Follow-up in 3 to 4 weeks for progress check and to determine the need for any additional therapy services   Melvenia Ada, OTR/L, CHT  05/14/2024, 2:54 PM

## 2024-05-27 ENCOUNTER — Other Ambulatory Visit: Payer: Self-pay

## 2024-05-27 ENCOUNTER — Other Ambulatory Visit (INDEPENDENT_AMBULATORY_CARE_PROVIDER_SITE_OTHER): Payer: Self-pay

## 2024-05-27 ENCOUNTER — Encounter: Payer: Self-pay | Admitting: Sports Medicine

## 2024-05-27 ENCOUNTER — Ambulatory Visit (INDEPENDENT_AMBULATORY_CARE_PROVIDER_SITE_OTHER): Admitting: Sports Medicine

## 2024-05-27 DIAGNOSIS — M7712 Lateral epicondylitis, left elbow: Secondary | ICD-10-CM

## 2024-05-27 DIAGNOSIS — M19022 Primary osteoarthritis, left elbow: Secondary | ICD-10-CM | POA: Diagnosis not present

## 2024-05-27 NOTE — Progress Notes (Signed)
 Devin Ryan - 58 y.o. male MRN [id number]  Date of birth: Feb 21, 1966  Office Visit Note: Visit Date: 05/27/2024 PCP: Cathlene Marry Lenis, FNP (Inactive) Referred by: No ref. provider found  Subjective: Chief Complaint  Patient presents with   Left Elbow - Pain   HPI: Devin Ryan is a pleasant 58 y.o. male who presents today for acute on chronic left elbow pain.  He continues with left-sided elbow pain.  Feels like the elbow needs to pop.  Pain continues around the lateral elbow near the lateral epicondyle, notices this is mostly when he is activating and using his forearm muscles.  He is continuing OT with Devin Ryan for this in his contralateral bicep/elbow.  Pertinent ROS were reviewed with the patient and found to be negative unless otherwise specified above in HPI.   Assessment & Plan: Visit Diagnoses:  1. Lateral epicondylitis, left elbow   2. Primary osteoarthritis of left elbow    Plan: Impression is chronic left elbow pain which does have evidence of lateral epicondylitis with inflammatory change.  He has gotten some relief from extracorporeal shockwave therapy and OT/PT but still having symptoms.  Through shared decision making, did proceed with ultrasound-guided lateral epicondyle and CET injection, patient tolerated well.  Advised on postinjection protocol.  We did obtain elbow x-rays which do show arthritic change within the elbow and the coronoid process, I did discuss with Devin Ryan that if for some reason he does not receive significant relief from this injection, we could always consider a one-time intra-articular injection, but hopefully this is not needed.  He will follow-up with Devin Ryan as needed.  Okay for over-the-counter anti-inflammatories PRN.  Follow-up: Return if symptoms worsen or fail to improve.   Meds & Orders: No orders of the defined types were placed in this encounter.   Orders Placed This Encounter  Procedures   US  Guided Needle  Placement - No Linked Charges   XR Elbow 2 Views Left     Procedures: Procedure: Lateral Epicondylitis (Common Extensor Tendon) Injection, Left Elbow After informed verbal consent was obtained, a timeout was performed.  Patient was lying supine on exam table.  Area overlying the affected lateral epicondyle was prepped with chloraprep and alcohol swabs. Then utilizing ultrasound guidance via an in-plane approach, the patient's lateral epicondyle and common extensor tendon was injected with 1:1:1 lidocaine :bupivicaine:betamethasone  with multiple needle fenestrations from a distal to proximal direction. Patient tolerated procedure well without immediate complications.  *Ultrasound guidance was required for proper insertion and injection within the lateral epicondylar region and's common extensor tendon origin.           Clinical History: No specialty comments available.  He reports that he has been smoking cigarettes. He has a 15 pack-year smoking history. He has never used smokeless tobacco. No results for input(s): HGBA1C, LABURIC in the last 8760 hours.  Objective:    Physical Exam  Gen: Well-appearing, in no acute distress; non-toxic CV: Well-perfused. Warm.  Resp: Breathing unlabored on room air; no wheezing. Psych: Fluid speech in conversation; appropriate affect; normal thought process  Ortho Exam - Left elbow: There is no redness swelling or effusion.  Positive TTP over the lateral epicondylar region and mild discomfort with resisted wrist extension.  Range of motion about 0-125/130 degrees.  Imaging: XR Elbow 2 Views Left Result Date: 05/27/2024 2 views of the left elbow including AP and lateral film were ordered and reviewed by myself today.  X-rays demonstrate mild to moderate arthritic  change within the elbow joint near the radiocapitellar joint as well as periarticular spurring off the coronoid process.  There is no elbow joint effusion.  No acute fracture  noted.  Past Medical/Family/Surgical/Social History: Medications & Allergies reviewed per EMR, new medications updated. 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   Tobacco use 09/03/2015   Insomnia 09/03/2015   Past Medical History:  Diagnosis Date   Anxiety    Diverticulitis    Eczema 11/30/2018   Headache 11/30/2018   Hyperlipidemia    Low testosterone     Vitamin D  insufficiency    Family History  Problem Relation Age of Onset   Diabetes Mother    Heart disease Father        Heart attack   Past Surgical History:  Procedure Laterality Date   COLONOSCOPY WITH PROPOFOL  N/A 06/20/2022   Procedure: COLONOSCOPY WITH PROPOFOL ;  Surgeon: Cindie Carlin POUR, DO;  Location: [facility];  Service: Endoscopy;  Laterality: N/A;  8:00am, asa 2   NOSE SURGERY     PARTIAL COLECTOMY N/A 09/28/2015   Procedure: PARTIAL COLECTOMY;  Surgeon: Oneil Budge, MD;  Location: [facility];  Service: General;  Laterality: N/A;   POLYPECTOMY  06/20/2022   Procedure: POLYPECTOMY;  Surgeon: Cindie Carlin POUR, DO;  Location: [facility];  Service: Endoscopy;;   Social History   Occupational History   Not on file  Tobacco Use   Smoking status: Every Day    Current packs/day: 0.50    Average packs/day: 0.5 packs/day for 30.0 years (15.0 ttl pk-yrs)    Types: Cigarettes   Smokeless tobacco: Never  Vaping Use   Vaping status: Never Used  Substance and Sexual Activity   Alcohol use: Yes    Comment: occ   Drug use: No   Sexual activity: Yes    Birth control/protection: None

## 2024-05-27 NOTE — Progress Notes (Signed)
 Patient is here for injection today. He says that his elbow feels about the same, and feels as though it needs to pop to feel better. He still notices it when using the forearm.

## 2024-05-31 ENCOUNTER — Encounter: Payer: Self-pay | Admitting: Radiology

## 2024-06-04 ENCOUNTER — Encounter: Admitting: Rehabilitative and Restorative Service Providers"

## 2024-06-24 ENCOUNTER — Ambulatory Visit (INDEPENDENT_AMBULATORY_CARE_PROVIDER_SITE_OTHER): Admitting: Nurse Practitioner

## 2024-06-24 ENCOUNTER — Ambulatory Visit: Payer: Self-pay

## 2024-06-24 ENCOUNTER — Telehealth: Payer: Self-pay | Admitting: Family Medicine

## 2024-06-24 VITALS — BP 116/69 | HR 55 | Temp 97.4°F | Ht 71.0 in | Wt 196.0 lb

## 2024-06-24 DIAGNOSIS — J069 Acute upper respiratory infection, unspecified: Secondary | ICD-10-CM | POA: Insufficient documentation

## 2024-06-24 DIAGNOSIS — R6889 Other general symptoms and signs: Secondary | ICD-10-CM | POA: Diagnosis not present

## 2024-06-24 LAB — VERITOR FLU A/B WAIVED
Influenza A: NEGATIVE
Influenza B: NEGATIVE

## 2024-06-24 MED ORDER — NOREL AD 4-10-325 MG PO TABS: 1.0000 | ORAL_TABLET | Freq: Two times a day (BID) | ORAL | 0 refills | Status: AC | PRN

## 2024-06-24 NOTE — Progress Notes (Addendum)
 Subjective:  Patient ID: Devin Ryan, male    DOB: May 29, 1966, 58 y.o.   MRN: [id number]  Patient Care Team: Cathlene Marry Lenis, FNP (Inactive) as PCP - General (Family Medicine)   Chief Complaint:  Fever (Symptoms started Friday but are getting better), Chills, Nasal Congestion, and Headache   HPI: Devin Ryan is a 58 y.o. male presenting on 06/24/2024 for Fever (Symptoms started Friday but are getting better), Chills, Nasal Congestion, and Headache   Discussed the use of AI scribe software for clinical note transcription with the patient, who gave verbal consent to proceed.  History of Present Illness Devin Ryan is a 58 year old male who presents with fever, chills, and headache.  He experienced a 'pop' at the base of his skull in the neck area on Friday evening, followed by the onset of fever and chills. His fever was recorded at 101F on Saturday, 99.67F on Sunday, and 97.79F on Monday morning. He has been taking ibuprofen and Norel AD to manage his symptoms.  Reports that he went to work this morning and was sent home because of his symptoms  He did not perform any COVID or flu swabs at home. He experienced some congestion on Saturday, which has since improved, but he continues to have a persistent headache, primarily around his eye area, and occasionally throughout his whole head. He reports intermittent cold chills and notes that his eyes appear red and dry. He uses Clear Eyes drops for his dry eyes.  Two weeks prior, he had similar symptoms of a cold, which resolved with ibuprofen and Norel AD. His appetite remains normal, as he typically consumes one meal a day, supplemented by a banana and an apple during the day. He drinks a lot of water and reports a little nasal congestion. He occasionally experiences seasonal allergies.      Relevant past medical, surgical, family, and social history reviewed and updated as indicated.  Allergies and medications  reviewed and updated. Data reviewed: Chart in Epic.   Past Medical History:  Diagnosis Date   Anxiety    Diverticulitis    Eczema 11/30/2018   Headache 11/30/2018   Hyperlipidemia    Low testosterone     Vitamin D  insufficiency     Past Surgical History:  Procedure Laterality Date   COLONOSCOPY WITH PROPOFOL  N/A 06/20/2022   Procedure: COLONOSCOPY WITH PROPOFOL ;  Surgeon: Cindie Carlin POUR, DO;  Location: [facility];  Service: Endoscopy;  Laterality: N/A;  8:00am, asa 2   NOSE SURGERY     PARTIAL COLECTOMY N/A 09/28/2015   Procedure: PARTIAL COLECTOMY;  Surgeon: Oneil Budge, MD;  Location: [facility];  Service: General;  Laterality: N/A;   POLYPECTOMY  06/20/2022   Procedure: POLYPECTOMY;  Surgeon: Cindie Carlin POUR, DO;  Location: [facility];  Service: Endoscopy;;    Social History   Socioeconomic History   Marital status: Single    Spouse name: Not on file   Number of children: Not on file   Years of education: Not on file   Highest education level: 12th grade  Occupational History   Not on file  Tobacco Use   Smoking status: Every Day    Current packs/day: 0.50    Average packs/day: 0.5 packs/day for 30.0 years (15.0 ttl pk-yrs)    Types: Cigarettes   Smokeless tobacco: Never  Vaping Use   Vaping status: Never Used  Substance and Sexual Activity   Alcohol use: Yes  Comment: occ   Drug use: No   Sexual activity: Yes    Birth control/protection: None  Other Topics Concern   Not on file  Social History Narrative   Not on file   Social Drivers of Health   Financial Resource Strain: Medium Risk (06/24/2024)   Overall Financial Resource Strain (CARDIA)    Difficulty of Paying Living Expenses: Somewhat hard  Food Insecurity: No Food Insecurity (06/24/2024)   Hunger Vital Sign    Worried About Running Out of Food in the Last Year: Never true    Ran Out of Food in the Last Year: Never true  Transportation Needs: No Transportation Needs (06/24/2024)   PRAPARE -  Administrator, Civil Service (Medical): No    Lack of Transportation (Non-Medical): No  Physical Activity: Inactive (06/24/2024)   Exercise Vital Sign    Days of Exercise per Week: 0 days    Minutes of Exercise per Session: Not on file  Stress: No Stress Concern Present (06/24/2024)   [employer] of Occupational Health - Occupational Stress Questionnaire    Feeling of Stress: Not at all  Social Connections: Socially Isolated (06/24/2024)   Social Connection and Isolation Panel    Frequency of Communication with Friends and Family: Once a week    Frequency of Social Gatherings with Friends and Family: Once a week    Attends Religious Services: More than 4 times per year    Active Member of [employer] or Organizations: No    Attends Banker Meetings: Not on file    Marital Status: Separated  Intimate Partner Violence: Not on file    Outpatient Encounter Medications as of 06/24/2024  Medication Sig   albuterol  (VENTOLIN  HFA) 108 (90 Base) MCG/ACT inhaler INHALE 1 PUFF INTO THE LUNGS EVERY 6 HOURS AS NEEDED FOR SHORTNESS OF BREATH   cetirizine  (ZYRTEC ) 10 MG tablet Take 1 tablet (10 mg total) by mouth daily. (Patient taking differently: Take 10 mg by mouth daily as needed for allergies.)   Chlorphen-PE-Acetaminophen  (NOREL AD) 4-10-325 MG TABS Take 1 tablet by mouth 2 (two) times daily as needed.   Cholecalciferol (VITAMIN D3) 125 MCG (5000 UT) TABS Take 5,000 Units by mouth daily with breakfast.   fluticasone  (FLONASE ) 50 MCG/ACT nasal spray USE 2 SPRAYS IN EACH NOSTRIL ONCE DAILY   ibuprofen (ADVIL,MOTRIN) 200 MG tablet Take 800 mg by mouth every 6 (six) hours as needed for moderate pain.   MAGNESIUM GLYCINATE PO Take 1 tablet by mouth daily.   Red Yeast Rice Extract (RED YEAST RICE PO) Take by mouth daily.   tadalafil  (CIALIS ) 10 MG tablet Take 1 tablet (10 mg total) by mouth every other day as needed for erectile dysfunction.   vitamin E 180 MG (400 UNITS)  capsule Take 400 Units by mouth daily.   Zinc 50 MG TABS Take 1 tablet by mouth daily.   testosterone  cypionate (DEPO-TESTOSTERONE ) 100 MG/ML injection Inject 0.5 mLs (50 mg total) into the muscle every 14 (fourteen) days. For IM use only   No facility-administered encounter medications on file as of 06/24/2024.    No Known Allergies  Pertinent ROS per HPI, otherwise unremarkable      Objective:  BP 116/69   Pulse (!) 55   Temp (!) 97.4 F (36.3 C) (Temporal)   Ht 5' 11 (1.803 m)   Wt 196 lb (88.9 kg)   SpO2 98%   BMI 27.34 kg/m    Wt Readings from Last 3 Encounters:  06/24/24 196 lb (88.9 kg)  02/28/24 195 lb (88.5 kg)  02/07/24 195 lb (88.5 kg)    Physical Exam Vitals and nursing note reviewed.  Constitutional:      General: He is not in acute distress. HENT:     Head: Normocephalic and atraumatic.     Right Ear: Tympanic membrane and ear canal normal. There is no impacted cerumen.     Left Ear: Tympanic membrane, ear canal and external ear normal. There is no impacted cerumen.     Nose: Nose normal.     Mouth/Throat:     Mouth: Mucous membranes are moist.  Eyes:     General: Lids are normal. Vision grossly intact.     Extraocular Movements: Extraocular movements intact.     Conjunctiva/sclera: Conjunctivae normal.     Pupils: Pupils are equal, round, and reactive to light.  Cardiovascular:     Heart sounds: Normal heart sounds.  Pulmonary:     Effort: Pulmonary effort is normal.     Breath sounds: Normal breath sounds.  Musculoskeletal:        General: Normal range of motion.     Right lower leg: No edema.     Left lower leg: No edema.  Skin:    General: Skin is warm and dry.     Findings: No rash.  Neurological:     Mental Status: He is alert and oriented to person, place, and time.  Psychiatric:        Mood and Affect: Mood normal.        Behavior: Behavior normal.        Thought Content: Thought content normal.        Judgment: Judgment normal.     Physical Exam      Results for orders placed or performed in visit on 06/24/24  Veritor Flu A/B Waived   Collection Time: 06/24/24 10:45 AM  Result Value Ref Range   Influenza A Negative Negative   Influenza B Negative Negative       Pertinent labs & imaging results that were available during my care of the patient were reviewed by me and considered in my medical decision making.  Assessment & Plan:  Devin Ryan was seen today for fever, chills, nasal congestion and headache.  Diagnoses and all orders for this visit:  Viral URI with cough -     Chlorphen-PE-Acetaminophen  (NOREL AD) 4-10-325 MG TABS; Take 1 tablet by mouth 2 (two) times daily as needed.  Flu-like symptoms -     Veritor Flu A/B Waived -     Chlorphen-PE-Acetaminophen  (NOREL AD) 4-10-325 MG TABS; Take 1 tablet by mouth 2 (two) times daily as needed.    Point-of-care flu A/B- negative  Assessment and Plan Assessment & Plan Acute viral upper respiratory infection Symptoms suggest viral infection. COVID test deemed clinically irrelevant due to elapsed treatment window. - Swab for flu. - Advise at-home COVID test flu swab is negative. - Encourage hydration.  Headache Persistent headache unresponsive to ibuprofen.  Dry eyes Dry eyes with redness typical during colds. - Use Clear Eyes or similar eye drops.  .  Flu A& B  negative Cough: Refilled Norel AD Return to work on on Wednesday provided -Increase hydration, rest   Called post visit reports positive home COVID Extend Work note 06/29/19   -Mask use when around others.  -Isolation period typically at least 5 days from symptom onset, with improvement in symptoms and fever-free >=24 hrs without antipyretics.  -Avoid  high-risk contacts (elderly, immunocompromised).  -Monitor for worsening symptoms (e.g., shortness of breath, chest pain, confusion, O? sat <94%).  -Red flag signs: difficulty breathing, persistent chest pain, new confusion,  inability to stay awake, bluish lips/face ? ER evaluation.   -Expected course: most mild cases improve in 7-10 days.      Continue all other maintenance medications.  Follow up plan: Return if symptoms worsen or fail to improve.   Continue healthy lifestyle choices, including diet (rich in fruits, vegetables, and lean proteins, and low in salt and simple carbohydrates) and exercise (at least 30 minutes of moderate physical activity daily).  Educational handout given for  Upper Respiratory Infection, Adult An upper respiratory infection (URI) is a common viral infection of the nose, throat, and upper air passages that lead to the lungs. The most common type of URI is the common cold. URIs usually get better on their own, without medical treatment. What are the causes? A URI is caused by a virus. You may catch a virus by: Breathing in droplets from an infected person's cough or sneeze. Touching something that has been exposed to the virus (is contaminated) and then touching your mouth, nose, or eyes. What increases the risk? You are more likely to get a URI if: You are very young or very old. You have close contact with others, such as at work, school, or a health care facility. You smoke. You have long-term (chronic) heart or lung disease. You have a weakened disease-fighting system (immune system). You have nasal allergies or asthma. You are experiencing a lot of stress. You have poor nutrition. What are the signs or symptoms? A URI usually involves some of the following symptoms: Runny or stuffy (congested) nose. Cough. Sneezing. Sore throat. Headache. Fatigue. Fever. Loss of appetite. Pain in your forehead, behind your eyes, and over your cheekbones (sinus pain). Muscle aches. Redness or irritation of the eyes. Pressure in the ears or face. How is this diagnosed? This condition may be diagnosed based on your medical history and symptoms, and a physical exam. Your  health care provider may use a swab to take a mucus sample from your nose (nasal swab). This sample can be tested to determine what virus is causing the illness. How is this treated? URIs usually get better on their own within 7-10 days. Medicines cannot cure URIs, but your health care provider may recommend certain medicines to help relieve symptoms, such as: Over-the-counter cold medicines. Cough suppressants. Coughing is a type of defense against infection that helps to clear the respiratory system, so take these medicines only as recommended by your health care provider. Fever-reducing medicines. Follow these instructions at home: Activity Rest as needed. If you have a fever, stay home from work or school until your fever is gone or until your health care provider says your URI cannot spread to other people (is no longer contagious). Your health care provider may have you wear a face mask to prevent your infection from spreading. Relieving symptoms Gargle with a mixture of salt and water 3-4 times a day or as needed. To make salt water, completely dissolve -1 tsp (3-6 g) of salt in 1 cup (237 mL) of warm water. Use a cool-mist humidifier to add moisture to the air. This can help you breathe more easily. Eating and drinking  Drink enough fluid to keep your urine pale yellow. Eat soups and other clear broths. General instructions  Take over-the-counter and prescription medicines only as told by your health care  provider. These include cold medicines, fever reducers, and cough suppressants. Do not use any products that contain nicotine or tobacco. These products include cigarettes, chewing tobacco, and vaping devices, such as e-cigarettes. If you need help quitting, ask your health care provider. Stay away from secondhand smoke. Stay up to date on all immunizations, including the yearly (annual) flu vaccine. Keep all follow-up visits. This is important. How to prevent the spread of  infection to others URIs can be contagious. To prevent the infection from spreading: Wash your hands with soap and water for at least 20 seconds. If soap and water are not available, use hand sanitizer. Avoid touching your mouth, face, eyes, or nose. Cough or sneeze into a tissue or your sleeve or elbow instead of into your hand or into the air.   Contact a health care provider if: You are getting worse instead of better. You have a fever or chills. Your mucus is brown or red. You have yellow or brown discharge coming from your nose. You have pain in your face, especially when you bend forward. You have swollen neck glands. You have pain while swallowing. You have white areas in the back of your throat. Get help right away if: You have shortness of breath that gets worse. You have severe or persistent: Headache. Ear pain. Sinus pain. Chest pain. You have chronic lung disease along with any of the following: Making high-pitched whistling sounds when you breathe, most often when you breathe out (wheezing). Prolonged cough (more than 14 days). Coughing up blood. A change in your usual mucus. You have a stiff neck. You have changes in your: Vision. Hearing. Thinking. Mood. These symptoms may be an emergency. Get help right away. Call 911. Do not wait to see if the symptoms will go away. Do not drive yourself to the hospital. Summary An upper respiratory infection (URI) is a common infection of the nose, throat, and upper air passages that lead to the lungs. A URI is caused by a virus. URIs usually get better on their own within 7-10 days. Medicines cannot cure URIs, but your health care provider may recommend certain medicines to help relieve symptoms. This information is not intended to replace advice given to you by your health care provider. Make sure you discuss any questions you have with your health care provider. Document Revised: 04/28/2021 Document Reviewed:  04/28/2021 Elsevier Patient Education  2024 Elsevier Inc. Viral Respiratory Infection A viral respiratory infection is an illness that affects parts of the body that are used for breathing. These include the lungs, nose, and throat. It is caused by a germ called a virus. Some examples of this kind of infection are: A cold. The flu (influenza). A respiratory syncytial virus (RSV) infection. What are the causes? This condition is caused by a virus. It spreads from person to person. You can get the virus if: You breathe in droplets from someone who is sick. You come in contact with people who are sick. You touch mucus or other fluid from a person who is sick. What are the signs or symptoms? Symptoms of this condition include: A stuffy or runny nose. A sore throat. A cough. Shortness of breath. Trouble breathing. Yellow or green fluid in the nose. Other symptoms may include: A fever. Sweating or chills. Tiredness (fatigue). Achy muscles. A headache. How is this treated? This condition may be treated with: Medicines that treat viruses. Medicines that make it easy to breathe. Medicines that are sprayed into the nose. Acetaminophen   or NSAIDs, such as ibuprofen, to treat fever. Follow these instructions at home: Managing pain and congestion Take over-the-counter and prescription medicines only as told by your doctor. If you have a sore throat, gargle with salt water. Do this 3-4 times a day or as needed. To make salt water, dissolve -1 tsp (3-6 g) of salt in 1 cup (237 mL) of warm water. Make sure that all the salt dissolves. Use nose drops made from salt water. This helps with stuffiness (congestion). It also helps soften the skin around your nose. Take 2 tsp (10 mL) of honey at bedtime to lessen coughing at night. Do not give honey to children who are younger than 61 year old. Drink enough fluid to keep your pee (urine) pale yellow. General instructions  Rest as much as  possible. Do not drink alcohol. Do not smoke or use any products that contain nicotine or tobacco. If you need help quitting, ask your doctor. Keep all follow-up visits. How is this prevented?     Get a flu shot every year. Ask your doctor when you should get your flu shot. Do not let other people get your germs. If you are sick: Wash your hands with soap and water often. Wash your hands after you cough or sneeze. Wash hands for at least 20 seconds. If you cannot use soap and water, use hand sanitizer. Cover your mouth when you cough. Cover your nose and mouth when you sneeze. Do not share cups or eating utensils. Clean commonly used objects often. Clean commonly touched surfaces. Stay home from work or school. Avoid contact with people who are sick during cold and flu season. This is in fall and winter. Get help if: Your symptoms last for 10 days or longer. Your symptoms get worse over time. You have very bad pain in your face or forehead. Parts of your jaw or neck get very swollen. You have shortness of breath. Get help right away if: You feel pain or pressure in your chest. You have trouble breathing. You faint or feel like you will faint. You keep vomiting and it gets worse. You feel confused. These symptoms may be an emergency. Get help right away. Call your local emergency services (911 in the [place] Do not wait to see if the symptoms will go away. Do not drive yourself to the hospital. Summary A viral respiratory infection is an illness that affects parts of the body that are used for breathing. Examples of this illness include a cold, the flu, and a respiratory syncytial virus (RSV) infection. The infection can cause a runny nose, cough, sore throat, and fever. Follow what your doctor tells you about taking medicines, drinking lots of fluid, washing your hands, resting at home, and avoiding people who are sick. This information is not intended to replace advice given to  you by your health care provider. Make sure you discuss any questions you have with your health care provider. Document Revised: 12/31/2020 Document Reviewed: 12/31/2020 Elsevier Patient Education  2024 Elsevier Inc.  The above assessment and management plan was discussed with the patient. The patient verbalized understanding of and has agreed to the management plan. Patient is aware to call the clinic if they develop any new symptoms or if symptoms persist or worsen. Patient is aware when to return to the clinic for a follow-up visit. Patient educated on when it is appropriate to go to the emergency department.  @SIGNATURE @

## 2024-06-24 NOTE — Telephone Encounter (Signed)
 FYI Only or Action Required?: FYI only for provider.  Patient was last seen in primary care on 01/22/2024 by Joesph Annabella HERO, FNP.  Called Nurse Triage reporting Headache, Fever, and Chills.  Symptoms began several days ago.  Interventions attempted: OTC medications: ibuprofen, Norel AD.  Symptoms are: moderate headache, fever on Saturday (resolved), sore throat, cough and chest congestion, chils, fatigue, body aches gradually improving.  Triage Disposition: See Physician Within 24 Hours  Patient/caregiver understands and will follow disposition?: Yes             Copied from CRM #[id number]. Topic: Clinical - Red Word Triage >> Jun 24, 2024  8:33 AM Cherylann RAMAN wrote: Red Word that prompted transfer to Nurse Triage: Patient heard a pop in the back of his head and instantly he began having a fever and cold chills. Fever had gotten up to 101 on Saturday, 99.8 on Sunday, and this morning it was 97.6. Patient has an ongoing headache. He has taken OTC Ibuprofen but the headache did not go away. Reason for Disposition  [1] MODERATE headache (e.g., interferes with normal activities) AND [2] present > 24 hours AND [3] unexplained  (Exceptions: Pain medicines not tried, typical migraine, or headache part of viral illness.)  Answer Assessment - Initial Assessment Questions 1. LOCATION: Where does it hurt?      Started Friday night at base of head/neck. He states today is the whole head, no particular spot.  2. ONSET: When did the headache start? (e.g., minutes, hours, days)      Friday evening around 8pm.  3. PATTERN: Does the pain come and go, or has it been constant since it started?     Constant, states it has gradually improved compared to Saturday and Sunday.  4. SEVERITY: How bad is the pain? and What does it keep you from doing?  (e.g., Scale 1-10; mild, moderate, or severe)     6-7/10. Dull ache.  5. RECURRENT SYMPTOM: Have you ever had headaches before? If Yes, ask:  When was the last time? and What happened that time?      He states he does suffer from headaches, as often as once a month. He states he usually treats with ibuprofen and it improves.  6. CAUSE: What do you think is causing the headache?     He states he searched his symptoms on the internet and it said bacterial meningitis. He states 2 weeks ago he started to feel like he was getting sick (pressure around eyes and just feeling a cold coming on) and he took ibuprofen and Norel AD and states the symptoms went away.  7. MIGRAINE: Have you been diagnosed with migraine headaches? If Yes, ask: Is this headache similar?      No.  8. HEAD INJURY: Has there been any recent injury to your head?      No.  9. OTHER SYMPTOMS: Do you have any other symptoms? (e.g., fever, stiff neck, eye pain, sore throat, cold symptoms)     Chills, fatigue, fever (101 Saturday, resolved by Sunday 99.8 and afebrile today), body aches, sore throat, eye dryness and burning, chest congestion and cough (mostly Saturday). Patient denies: neck stiffness, is able to turn head left and right and tuck chin to chest without pain; difficulty breathing, eye pain, unilateral numbness or weakness, changes in speech.  10. PREGNANCY: Is there any chance you are pregnant? When was your last menstrual period?       N/A.  Protocols used: [facility]

## 2024-06-24 NOTE — Telephone Encounter (Signed)
 Work note was sent to mychart for pt

## 2024-06-24 NOTE — Telephone Encounter (Signed)
 Copied from CRM [id number]. Topic: Clinical - Medical Advice >> Jun 24, 2024 12:25 PM Edsel HERO wrote: Patient was told by provider at his visit this morning to take an at home Covid test and advise if it was positive. Patient states that Covid test was positive and he will need to extend his days out of work. Patient would like out of work note sent to his MyChart. Please advise.

## 2024-07-11 ENCOUNTER — Ambulatory Visit: Admitting: "Endocrinology

## 2024-07-25 ENCOUNTER — Encounter: Payer: Self-pay | Admitting: Family Medicine

## 2024-07-25 ENCOUNTER — Ambulatory Visit: Admitting: Family Medicine

## 2024-07-25 VITALS — BP 115/65 | HR 71 | Temp 98.6°F | Ht 71.0 in | Wt 200.0 lb

## 2024-07-25 DIAGNOSIS — Z0000 Encounter for general adult medical examination without abnormal findings: Secondary | ICD-10-CM

## 2024-07-25 DIAGNOSIS — Z0001 Encounter for general adult medical examination with abnormal findings: Secondary | ICD-10-CM

## 2024-07-25 DIAGNOSIS — E782 Mixed hyperlipidemia: Secondary | ICD-10-CM

## 2024-07-25 NOTE — Patient Instructions (Signed)
 Health Maintenance, Male  Adopting a healthy lifestyle and getting preventive care are important in promoting health and wellness. Ask your health care provider about:  The right schedule for you to have regular tests and exams.  Things you can do on your own to prevent diseases and keep yourself healthy.  What should I know about diet, weight, and exercise?  Eat a healthy diet    Eat a diet that includes plenty of vegetables, fruits, low-fat dairy products, and lean protein.  Do not eat a lot of foods that are high in solid fats, added sugars, or sodium.  Maintain a healthy weight  Body mass index (BMI) is a measurement that can be used to identify possible weight problems. It estimates body fat based on height and weight. Your health care provider can help determine your BMI and help you achieve or maintain a healthy weight.  Get regular exercise  Get regular exercise. This is one of the most important things you can do for your health. Most adults should:  Exercise for at least 150 minutes each week. The exercise should increase your heart rate and make you sweat (moderate-intensity exercise).  Do strengthening exercises at least twice a week. This is in addition to the moderate-intensity exercise.  Spend less time sitting. Even light physical activity can be beneficial.  Watch cholesterol and blood lipids  Have your blood tested for lipids and cholesterol at 56 years of age, then have this test every 5 years.  You may need to have your cholesterol levels checked more often if:  Your lipid or cholesterol levels are high.  You are older than 56 years of age.  You are at high risk for heart disease.  What should I know about cancer screening?  Many types of cancers can be detected early and may often be prevented. Depending on your health history and family history, you may need to have cancer screening at various ages. This may include screening for:  Colorectal cancer.  Prostate cancer.  Skin cancer.  Lung  cancer.  What should I know about heart disease, diabetes, and high blood pressure?  Blood pressure and heart disease  High blood pressure causes heart disease and increases the risk of stroke. This is more likely to develop in people who have high blood pressure readings or are overweight.  Talk with your health care provider about your target blood pressure readings.  Have your blood pressure checked:  Every 3-5 years if you are 24-52 years of age.  Every year if you are 3 years old or older.  If you are between the ages of 60 and 72 and are a current or former smoker, ask your health care provider if you should have a one-time screening for abdominal aortic aneurysm (AAA).  Diabetes  Have regular diabetes screenings. This checks your fasting blood sugar level. Have the screening done:  Once every three years after age 66 if you are at a normal weight and have a low risk for diabetes.  More often and at a younger age if you are overweight or have a high risk for diabetes.  What should I know about preventing infection?  Hepatitis B  If you have a higher risk for hepatitis B, you should be screened for this virus. Talk with your health care provider to find out if you are at risk for hepatitis B infection.  Hepatitis C  Blood testing is recommended for:  Everyone born from [birth year] through 1963.  Anyone  with known risk factors for hepatitis C.  Sexually transmitted infections (STIs)  You should be screened each year for STIs, including gonorrhea and chlamydia, if:  You are sexually active and are younger than 56 years of age.  You are older than 56 years of age and your health care provider tells you that you are at risk for this type of infection.  Your sexual activity has changed since you were last screened, and you are at increased risk for chlamydia or gonorrhea. Ask your health care provider if you are at risk.  Ask your health care provider about whether you are at high risk for HIV. Your health care provider  may recommend a prescription medicine to help prevent HIV infection. If you choose to take medicine to prevent HIV, you should first get tested for HIV. You should then be tested every 3 months for as long as you are taking the medicine.  Follow these instructions at home:  Alcohol use  Do not drink alcohol if your health care provider tells you not to drink.  If you drink alcohol:  Limit how much you have to 0-2 drinks a day.  Know how much alcohol is in your drink. In the [place] one drink equals one 12 oz bottle of beer (355 mL), one 5 oz glass of wine (148 mL), or one 1 oz glass of hard liquor (44 mL).  Lifestyle  Do not use any products that contain nicotine or tobacco. These products include cigarettes, chewing tobacco, and vaping devices, such as e-cigarettes. If you need help quitting, ask your health care provider.  Do not use street drugs.  Do not share needles.  Ask your health care provider for help if you need support or information about quitting drugs.  General instructions  Schedule regular health, dental, and eye exams.  Stay current with your vaccines.  Tell your health care provider if:  You often feel depressed.  You have ever been abused or do not feel safe at home.  Summary  Adopting a healthy lifestyle and getting preventive care are important in promoting health and wellness.  Follow your health care provider's instructions about healthy diet, exercising, and getting tested or screened for diseases.  Follow your health care provider's instructions on monitoring your cholesterol and blood pressure.  This information is not intended to replace advice given to you by your health care provider. Make sure you discuss any questions you have with your health care provider.  Document Revised: 02/15/2021 Document Reviewed: 02/15/2021  Elsevier Patient Education  2024 [employer].

## 2024-07-25 NOTE — Progress Notes (Signed)
 Complete physical exam  Patient: Devin Ryan   DOB: 1965/10/30   58 y.o. Male  MRN: [id number]  Subjective:    Chief Complaint  Patient presents with   Medical Management of Chronic Issues    Devin Ryan is a 58 y.o. male who presents today for a complete physical exam. He reports consuming a general diet. The patient does not participate in regular exercise at present. He generally feels fairly well. He reports sleeping well. He does not have additional problems to discuss today.   He takes red yeast rice for HLD.   Hx of low testosterone . Has seen endocrinology and was told that this was normal for his age. He does have fatigue that he contributes to low testosterone .   Declines labs today.   Most recent fall risk assessment:    07/25/2024    2:31 PM  Fall Risk   Falls in the past year? 0     Most recent depression screenings:    07/25/2024    2:31 PM 01/22/2024    9:07 AM  PHQ 2/9 Scores  PHQ - 2 Score 0 0  PHQ- 9 Score 1     Vision:Within last year and Dental: Receives regular dental care    Patient Care Team: Cathlene Marry Lenis, FNP as PCP - General (Family Medicine)   Outpatient Medications Prior to Visit  Medication Sig   albuterol  (VENTOLIN  HFA) 108 (90 Base) MCG/ACT inhaler INHALE 1 PUFF INTO THE LUNGS EVERY 6 HOURS AS NEEDED FOR SHORTNESS OF BREATH   cetirizine  (ZYRTEC ) 10 MG tablet Take 1 tablet (10 mg total) by mouth daily.   Chlorphen-PE-Acetaminophen  (NOREL AD) 4-10-325 MG TABS Take 1 tablet by mouth 2 (two) times daily as needed.   Cholecalciferol (VITAMIN D3) 125 MCG (5000 UT) TABS Take 5,000 Units by mouth daily with breakfast.   fluticasone  (FLONASE ) 50 MCG/ACT nasal spray USE 2 SPRAYS IN EACH NOSTRIL ONCE DAILY   ibuprofen (ADVIL,MOTRIN) 200 MG tablet Take 800 mg by mouth every 6 (six) hours as needed for moderate pain.   MAGNESIUM GLYCINATE PO Take 1 tablet by mouth daily.   Red Yeast Rice Extract (RED YEAST RICE PO) Take by mouth  daily.   vitamin E 180 MG (400 UNITS) capsule Take 400 Units by mouth daily.   Zinc 50 MG TABS Take 1 tablet by mouth daily.   [DISCONTINUED] tadalafil  (CIALIS ) 10 MG tablet Take 1 tablet (10 mg total) by mouth every other day as needed for erectile dysfunction.   [DISCONTINUED] testosterone  cypionate (DEPO-TESTOSTERONE ) 100 MG/ML injection Inject 0.5 mLs (50 mg total) into the muscle every 14 (fourteen) days. For IM use only   No facility-administered medications prior to visit.    ROS Negative unless specially indicated above in HPI.    Objective:     BP 115/65   Pulse 71   Temp 98.6 F (37 C) (Temporal)   Ht 5' 11 (1.803 m)   Wt 200 lb (90.7 kg)   SpO2 95%   BMI 27.89 kg/m    Physical Exam   No results found for any visits on 07/25/24.     Assessment & Plan:    Routine Health Maintenance and Physical Exam  Devin Ryan was seen today for annual exam.  Diagnoses and all orders for this visit:  Routine general medical examination at a health care facility  Mixed hyperlipidemia Declined labs. Continue red yeast rice supplement. Diet, exercise.    Immunization History  Administered Date(s)  Administered   Tdap 04/18/2022    Health Maintenance  Topic Date Due   Influenza Vaccine  01/07/2025 (Originally 05/10/2024)   Pneumococcal Vaccine: 50+ Years (1 of 2 - PCV) 07/25/2025 (Originally 12/18/1986)   Hepatitis B Vaccines 19-59 Average Risk (1 of 3 - 19+ 3-dose series) 07/25/2025 (Originally 12/18/1986)   COVID-19 Vaccine (1) 08/10/2025 (Originally 12/17/1972)   Zoster Vaccines- Shingrix (1 of 2) 10/25/2025 (Originally 12/18/1986)   DTaP/Tdap/Td (2 - Td or Tdap) 04/18/2032   Colonoscopy  06/20/2032   Hepatitis C Screening  Completed   HIV Screening  Completed   HPV VACCINES  Aged Out   Meningococcal B Vaccine  Aged Out    Discussed health benefits of physical activity, and encouraged him to engage in regular exercise appropriate for his age and  condition.  Problem List Items Addressed This Visit       Other   Mixed hyperlipidemia   Other Visit Diagnoses       Routine general medical examination at a health care facility    -  Primary      Return in 1 year (on 07/25/2025) for CPE with labs.   The patient indicates understanding of these issues and agrees with the plan.  Devin CHRISTELLA Search, FNP

## 2024-08-12 ENCOUNTER — Encounter: Payer: Self-pay | Admitting: Radiology

## 2024-09-16 ENCOUNTER — Encounter: Payer: Self-pay | Admitting: Sports Medicine

## 2024-09-16 ENCOUNTER — Encounter: Payer: Self-pay | Admitting: Family Medicine

## 2024-09-17 ENCOUNTER — Encounter: Payer: Self-pay | Admitting: Sports Medicine

## 2024-09-17 ENCOUNTER — Ambulatory Visit: Admitting: Sports Medicine

## 2024-09-17 ENCOUNTER — Other Ambulatory Visit: Payer: Self-pay

## 2024-09-17 DIAGNOSIS — M25522 Pain in left elbow: Secondary | ICD-10-CM

## 2024-09-17 DIAGNOSIS — S46212A Strain of muscle, fascia and tendon of other parts of biceps, left arm, initial encounter: Secondary | ICD-10-CM

## 2024-09-17 NOTE — Progress Notes (Signed)
 [name] - 58 y.o. male MRN [id number]  Date of birth: 1966/03/04  Office Visit Note: Visit Date: 09/17/2024 PCP: Joesph Annabella HERO, FNP Referred by: Joesph Annabella HERO, FNP  Subjective: Chief Complaint  Patient presents with   Left Elbow - Pain   HPI: Devin Ryan is a pleasant 58 y.o. male who presents today for new injury of left elbow.  Has had a history of right and left elbow pain. History of partial R-biceps tendon tear, treated conservatively (shocwkave, PT/OT). And L-LE epicondylitis. [name] states that just yesterday he was moving a 60lb bin yesterday with the arms supinated and slightly flexed when he lowered it down slightly when he felt and heard a pop from his elbow. He was wearing protective ear muffs and heard the pop through those. He points over the volar medial elbow when describing his pain. When he supinates the forearm and flexes the elbow, he says it feels as though he is lifting a 25lb weight. He did two rounds of 15 minutes on, and 15 minutes off, alternating ice and heat, but has otherwise not done anything to treat this pain. + Swelling, no bruising.  Pertinent ROS were reviewed with the patient and found to be negative unless otherwise specified above in HPI.   Assessment & Plan: Visit Diagnoses:  1. Rupture of left distal biceps tendon, initial encounter   2. Pain in left elbow    Plan: Impression is acute left elbow/distal bicep injury while lifting/transferring a 60 lb bin with an audible and palpable pop.  Physical exam and ultrasound images are highly concerning for distal bicep tendon rupture vs. High-grade partial tear.  Discussed the nature of this injury as well as possible surgical fixation.  Discussed with [name] proceeding with MRI of the elbow to confirm full rupture versus partial tearing, he is amenable to this.  I did discuss this case with Dr. Addie.  He would like to see him in the short-term to further evaluate and see if MRI is indicated or  whether surgical intervention may be pursued with my ultrasound images and his exam.  He will make room to see patient tomorrow and discuss distal biceps tendon repair if confirmed.  Recommended ice and over-the-counter anti-inflammatories in the short-term for [name] in the interim.  Follow-up: Return for Will f/u with Dr. Addie tomorrow for discussion on possible surgical fixation.   Meds & Orders: No orders of the defined types were placed in this encounter.   Orders Placed This Encounter  Procedures   US  Extrem Up Left Ltd   MR Elbow Left w/o contrast     Procedures: No procedures performed      Clinical History: No specialty comments available.  He reports that he has been smoking cigarettes. He has a 15 pack-year smoking history. He has never used smokeless tobacco. No results for input(s): HGBA1C, LABURIC in the last 8760 hours.  Objective:     Physical Exam  Gen: Well-appearing, in no acute distress; non-toxic CV: Well-perfused. Warm.  Resp: Breathing unlabored on room air; no wheezing. Psych: Fluid speech in conversation; appropriate affect; normal thought process  Ortho Exam - Left elbow: There is tenderness over the region of the distal bicep.  There is notable soft tissue swelling in the elbow crease in the volar proximal forearm.  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  There is pain and associated weakness with resisted flexion and supination, although no gross weakness  present.  I am unable to perform hook test for the distal bicep.  There is difficulty with full range extension about the elbow secondary to pain.  Imaging: US  Extrem Up Left Ltd Result Date: 09/17/2024 Limited musculoskeletal ultrasound of the left elbow was performed today.  Medial epicondyle was visualized with proper insertion of the common flexor tendon. There is a degree of insertional tendinopathy here.  No elbow joint effusion. No abnormality of the radiocarpal  joint other than joint space narrowing.  When evaluating the bicep tendon in both short and long axis, there is evidence of tendon disruption with what appears to be full-thickness rupture versus high-grade partial tearing.  Scanning and long axis this does appear to be retracted about 4.6 cm in nature.   Concern for distal biceps tendon rupture versus less likely high-grade partial tearing     05/27/24: 2 views of the left elbow including AP and lateral film were ordered and  reviewed by myself today.  X-rays demonstrate mild to moderate arthritic  change within the elbow joint near the radiocapitellar joint as well as  periarticular spurring off the coronoid process.  There is no elbow joint  effusion.  No acute fracture noted.   Past Medical/Family/Surgical/Social History: Medications & Allergies reviewed per EMR, new medications updated. Patient Active Problem List   Diagnosis Date Noted   Vitamin D  deficiency 06/03/2022   Mixed hyperlipidemia 06/03/2022   Eczema 11/30/2018   Male sexual dysfunction 09/18/2018   Tobacco use 09/03/2015   Past Medical History:  Diagnosis Date   Anxiety    Diverticulitis    Eczema 11/30/2018   Headache 11/30/2018   Hyperlipidemia    Low testosterone     Vitamin D  insufficiency    Family History  Problem Relation Age of Onset   Diabetes Mother    Heart disease Father        Heart attack   Heart attack Father 23   Past Surgical History:  Procedure Laterality Date   BOWEL RESECTION Left 2017   COLONOSCOPY WITH PROPOFOL  N/A 06/20/2022   Procedure: COLONOSCOPY WITH PROPOFOL ;  Surgeon: Cindie Carlin POUR, DO;  Location: [facility];  Service: Endoscopy;  Laterality: N/A;  8:00am, asa 2   NOSE SURGERY     PARTIAL COLECTOMY N/A 09/28/2015   Procedure: PARTIAL COLECTOMY;  Surgeon: Oneil Budge, MD;  Location: [facility];  Service: General;  Laterality: N/A;   POLYPECTOMY  06/20/2022   Procedure: POLYPECTOMY;  Surgeon: Cindie Carlin POUR, DO;   Location: [facility];  Service: Endoscopy;;   Social History   Occupational History   Not on file  Tobacco Use   Smoking status: Every Day    Current packs/day: 0.50    Average packs/day: 0.5 packs/day for 30.0 years (15.0 ttl pk-yrs)    Types: Cigarettes   Smokeless tobacco: Never  Vaping Use   Vaping status: Never Used  Substance and Sexual Activity   Alcohol use: Yes    Alcohol/week: 4.0 standard drinks of alcohol    Types: 4 Cans of beer per week    Comment: occ   Drug use: No   Sexual activity: Yes    Birth control/protection: None   I spent 35 minutes in the care of the patient today including face-to-face time, preparation to see the patient, as well as review of previous x-ray imaging, review of both physical exam and ultrasound findings with concern for distal bicep tendon rupture, discussion with [name] regarding surgical intervention and generalized overview of  possible surgery to be performed by one of my orthopedic colleagues, same day discussion with Dr. Addie, possible operating physician for the above diagnoses.   Lonell Sprang, DO Primary Care Sports Medicine Physician  [facility]  This note was dictated using Dragon naturally speaking software and may contain errors in syntax, spelling, or content which have not been identified prior to signing this note.

## 2024-09-17 NOTE — Progress Notes (Signed)
 Patient says that he was moving a 60lb bin yesterday when he felt and heard a pop from his elbow. He was wearing protective ear muffs and heard the pop through those. He points over the medial elbow just distal to the epicondyle when describing his pain. When he supinates the forearm and flexes the elbow, he says it feels as though he is lifting a 25lb weight. He did two rounds of 15 minutes on, and 15 minutes off, alternating ice and heat, but has otherwise not done anything to treat this pain. He has not noticed any bruising or swelling.

## 2024-09-18 ENCOUNTER — Ambulatory Visit: Admitting: Orthopedic Surgery

## 2024-09-18 ENCOUNTER — Other Ambulatory Visit: Payer: Self-pay

## 2024-09-18 ENCOUNTER — Encounter: Payer: Self-pay | Admitting: Orthopedic Surgery

## 2024-09-18 ENCOUNTER — Encounter (HOSPITAL_COMMUNITY): Payer: Self-pay | Admitting: Orthopedic Surgery

## 2024-09-18 DIAGNOSIS — S46212A Strain of muscle, fascia and tendon of other parts of biceps, left arm, initial encounter: Secondary | ICD-10-CM | POA: Diagnosis not present

## 2024-09-18 NOTE — Telephone Encounter (Signed)
 Patient has been scheduled

## 2024-09-18 NOTE — Progress Notes (Signed)
 PCP - Annabella Search, FNP Cardiologist - denies  PPM/ICD - denies   Chest x-ray - 09/03/15 EKG - 09/24/15 (n/a) Stress Test - denies ECHO - denies Cardiac Cath - denies  CPAP - denies  DM- denies  ASA/Blood Thinner Instructions: n/a   ERAS Protcol - clears until 1155  COVID TEST- n/a  Anesthesia review: no  Patient verbally denies any shortness of breath, fever, cough and chest pain during phone call       Questions were answered. Patient verbalized understanding of instructions.

## 2024-09-18 NOTE — Progress Notes (Signed)
 Office Visit Note   Patient: Devin Ryan           Date of Birth: 12-10-1965           MRN: [id number] Visit Date: 09/18/2024 Requested by: Joesph Annabella HERO, FNP [address] [place],  [place] [address] PCP: Joesph Annabella HERO, FNP  Subjective: Chief Complaint  Patient presents with   Other    Left arm injury 09/16/24  Biceps tendon rupture    HPI: Devin Ryan is a 58 y.o. male who presents to the office reporting Left arm pain.: [name] is an active 58 year old right-hand-dominant patient who is doing some lifting on Monday.  Felt a pop in his left elbow.  Has had pain weakness and swelling since that time.  Of note is the patient did have partial tear of his right distal biceps which has healed but remains somewhat symptomatic.  Patient describes weakness as well as pain with full extension on the left-hand side.  Ultrasound examination does confirm laxity and detachment of the distal biceps tendon..                ROS: All systems reviewed are negative as they relate to the chief complaint within the history of present illness.  Patient denies fevers or chills.  Assessment & Plan: Visit Diagnoses:  1. Rupture of left distal biceps tendon, initial encounter     Plan: Impression is right distal biceps rupture. Patient has proximal migration of the biceps along with no definite palpable tendon within the elbow flexion crease as well as weakness with supination. Ultrasound examination confirms laxity of the biceps tendon as well as detachment. Injury is about 31 days old. Plan is operative distal biceps tendon reattachment. The risk and benefits are discussed with the patient including not limited to infection nerve vessel damage heterotopic ossification elbow stiffness as well as potential need for further surgery. Patient understands. All questions answered. Plan to use anti-inflammatory for the first week postop to diminish chances for heterotopic ossification.   Follow-Up  Instructions: No follow-ups on file.   Orders:  No orders of the defined types were placed in this encounter.  No orders of the defined types were placed in this encounter.     Procedures: No procedures performed   Clinical Data: No additional findings.  Objective: Vital Signs: There were no vitals taken for this visit.  Physical Exam:  Constitutional: Patient appears well-developed HEENT:  Head: Normocephalic Eyes:EOM are normal Neck: Normal range of motion Cardiovascular: Normal rate Pulmonary/chest: Effort normal Neurologic: Patient is alert Skin: Skin is warm Psychiatric: Patient has normal mood and affect  Ortho Exam: Ortho exam demonstrates palpable radial pulse on that left-hand side. Patient does have swelling about the proximal forearm. Compartments are soft. EPL FPL interosseous function is intact. Patient does not have a palpable distal biceps tendon and the muscle contour is different left versus right with more proximal contour on the left-hand side. Patient has about 50% less supination strength on the left compared to the right.   Specialty Comments:  No specialty comments available.  Imaging: No results found.   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   Tobacco use 09/03/2015   Past Medical History:  Diagnosis Date   Anxiety    Diverticulitis    Eczema 11/30/2018   Headache 11/30/2018  Hyperlipidemia    Low testosterone     Vitamin D  insufficiency     Family History  Problem Relation Age of Onset   Diabetes Mother    Heart disease Father        Heart attack   Heart attack Father 23    Past Surgical History:  Procedure Laterality Date   BOWEL RESECTION Left 2017   COLONOSCOPY WITH PROPOFOL  N/A 06/20/2022   Procedure: COLONOSCOPY WITH PROPOFOL ;  Surgeon: Cindie Carlin POUR, DO;   Location: [facility];  Service: Endoscopy;  Laterality: N/A;  8:00am, asa 2   NOSE SURGERY     PARTIAL COLECTOMY N/A 09/28/2015   Procedure: PARTIAL COLECTOMY;  Surgeon: Oneil Budge, MD;  Location: [facility];  Service: General;  Laterality: N/A;   POLYPECTOMY  06/20/2022   Procedure: POLYPECTOMY;  Surgeon: Cindie Carlin POUR, DO;  Location: [facility];  Service: Endoscopy;;   Social History   Occupational History   Not on file  Tobacco Use   Smoking status: Every Day    Current packs/day: 0.50    Average packs/day: 0.5 packs/day for 30.0 years (15.0 ttl pk-yrs)    Types: Cigarettes   Smokeless tobacco: Never  Vaping Use   Vaping status: Never Used  Substance and Sexual Activity   Alcohol use: Yes    Alcohol/week: 4.0 standard drinks of alcohol    Types: 4 Cans of beer per week    Comment: occ   Drug use: No   Sexual activity: Yes    Birth control/protection: None

## 2024-09-18 NOTE — Pre-Procedure Instructions (Signed)
-------------    SDW INSTRUCTIONS given:  Your procedure is scheduled on 12/11.  Report to [facility] at 12:25 P.M., and check in at the Admitting office.  Any questions or running late day of surgery: call [phone]    Remember:  Do not eat after midnight the night before your surgery  You may drink clear liquids until 11:55 AM the morning of your surgery.   Clear liquids allowed are: Water, Non-Citrus Juices (without pulp), Carbonated Beverages, Clear Tea, Black Coffee Only, and Gatorade    Take these medicines the morning of surgery with A SIP OF WATER   May take these medicines IF NEEDED: albuterol  (VENTOLIN  HFA)- bring with you  As of today, STOP taking any Aspirin (unless otherwise instructed by your surgeon) Aleve, Naproxen, Ibuprofen, Motrin, Advil, Goody's, BC's, all herbal medications, fish oil, and all vitamins.   Do NOT Smoke (Tobacco/Vaping) 24 hours prior to your procedure  If you use a CPAP at night, you may bring all equipment for your overnight stay.     You will be asked to remove any contacts, glasses, piercing's, hearing aid's, dentures/partials prior to surgery. Please bring cases for these items if needed.     Patients discharged the day of surgery will not be allowed to drive home, and someone needs to stay with them for 24 hours.  SURGICAL WAITING ROOM VISITATION Patients may have no more than 2 support people in the waiting area - these visitors may rotate.   Pre-op nurse will coordinate an appropriate time for 1 ADULT support person, who may not rotate, to accompany patient in pre-op.  Children under the age of 25 must have an adult with them who is not the patient and must remain in the main waiting area with an adult.  If the patient needs to stay at the hospital during part of their recovery, the visitor guidelines for inpatient rooms apply.  Please refer to the [facility] website for the visitor guidelines for any additional  information.   Special instructions:   [place]- Preparing For Surgery   Please follow these instructions carefully.   Shower the NIGHT BEFORE SURGERY and the MORNING OF SURGERY with DIAL Soap.   Pat yourself dry with a CLEAN TOWEL.  Wear CLEAN PAJAMAS to bed the night before surgery  Place CLEAN SHEETS on your bed the night of your first shower and DO NOT SLEEP WITH PETS.   Additional instructions for the day of surgery: DO NOT APPLY any lotions, deodorants, cologne, or perfumes.   Do not wear jewelry or makeup Do not wear nail polish, gel polish, artificial nails, or any other type of covering on natural nails (fingers and toes) Do not bring valuables to the hospital. [facility] is not responsible for valuables/personal belongings. Put on clean/comfortable clothes.  Please brush your teeth.  Ask your nurse before applying any prescription medications to the skin.

## 2024-09-19 ENCOUNTER — Encounter (HOSPITAL_COMMUNITY)
Admission: RE | Disposition: A | Discharge status: Home / Self Care | Payer: Self-pay | Source: Home / Self Care | Attending: Orthopedic Surgery

## 2024-09-19 ENCOUNTER — Encounter (HOSPITAL_COMMUNITY): Payer: Self-pay | Admitting: Orthopedic Surgery

## 2024-09-19 ENCOUNTER — Ambulatory Visit (HOSPITAL_COMMUNITY)
Admission: RE | Admit: 2024-09-19 | Discharge: 2024-09-19 | Disposition: A | Discharge status: Home / Self Care | Attending: Orthopedic Surgery | Admitting: Orthopedic Surgery

## 2024-09-19 ENCOUNTER — Ambulatory Visit (HOSPITAL_COMMUNITY): Admitting: Anesthesiology

## 2024-09-19 ENCOUNTER — Ambulatory Visit (HOSPITAL_COMMUNITY)

## 2024-09-19 DIAGNOSIS — S46212A Strain of muscle, fascia and tendon of other parts of biceps, left arm, initial encounter: Secondary | ICD-10-CM | POA: Diagnosis not present

## 2024-09-19 DIAGNOSIS — F419 Anxiety disorder, unspecified: Secondary | ICD-10-CM | POA: Diagnosis not present

## 2024-09-19 DIAGNOSIS — X58XXXA Exposure to other specified factors, initial encounter: Secondary | ICD-10-CM | POA: Diagnosis not present

## 2024-09-19 DIAGNOSIS — Z7951 Long term (current) use of inhaled steroids: Secondary | ICD-10-CM | POA: Diagnosis not present

## 2024-09-19 DIAGNOSIS — S46212D Strain of muscle, fascia and tendon of other parts of biceps, left arm, subsequent encounter: Secondary | ICD-10-CM

## 2024-09-19 DIAGNOSIS — Z01818 Encounter for other preprocedural examination: Secondary | ICD-10-CM

## 2024-09-19 DIAGNOSIS — J449 Chronic obstructive pulmonary disease, unspecified: Secondary | ICD-10-CM | POA: Diagnosis not present

## 2024-09-19 DIAGNOSIS — G8918 Other acute postprocedural pain: Secondary | ICD-10-CM | POA: Diagnosis not present

## 2024-09-19 HISTORY — PX: DISTAL BICEPS TENDON REPAIR: SHX1461

## 2024-09-19 LAB — CBC
HCT: 46.7 % (ref 39.0–52.0)
Hemoglobin: 16.6 g/dL (ref 13.0–17.0)
MCH: 32.7 pg (ref 26.0–34.0)
MCHC: 35.5 g/dL (ref 30.0–36.0)
MCV: 92.1 fL (ref 80.0–100.0)
Platelets: 237 K/uL (ref 150–400)
RBC: 5.07 MIL/uL (ref 4.22–5.81)
RDW: 11.8 % (ref 11.5–15.5)
WBC: 10.4 K/uL (ref 4.0–10.5)
nRBC: 0 % (ref 0.0–0.2)

## 2024-09-19 LAB — BASIC METABOLIC PANEL WITH GFR
Anion gap: 11 (ref 5–15)
BUN: 11 mg/dL (ref 6–20)
CO2: 24 mmol/L (ref 22–32)
Calcium: 9.2 mg/dL (ref 8.9–10.3)
Chloride: 102 mmol/L (ref 98–111)
Creatinine, Ser: 0.82 mg/dL (ref 0.61–1.24)
GFR, Estimated: >60 mL/min (ref >60)
Glucose, Bld: 89 mg/dL (ref 70–99)
Potassium: 3.9 mmol/L (ref 3.5–5.1)
Sodium: 137 mmol/L (ref 135–145)

## 2024-09-19 MED ORDER — FENTANYL CITRATE (PF) 250 MCG/5ML IJ SOLN
INTRAMUSCULAR | Status: AC
  Filled 2024-09-19: qty 5

## 2024-09-19 MED ORDER — MIDAZOLAM HCL (PF) 2 MG/2ML IJ SOLN
INTRAMUSCULAR | Status: DC | PRN
  Administered 2024-09-19: 2 mg via INTRAVENOUS

## 2024-09-19 MED ORDER — CEFAZOLIN SODIUM-DEXTROSE 2-4 GM/100ML-% IV SOLN
2.0000 g | INTRAVENOUS | Status: AC
  Administered 2024-09-19: 2 g via INTRAVENOUS
  Filled 2024-09-19: qty 100

## 2024-09-19 MED ORDER — POVIDONE-IODINE 10 % EX SWAB
2.0000 | Freq: Once | CUTANEOUS | Status: AC
  Administered 2024-09-19: 2 via TOPICAL

## 2024-09-19 MED ORDER — OXYCODONE HCL 5 MG/5ML PO SOLN
5.0000 mg | Freq: Once | ORAL | Status: AC | PRN
  Administered 2024-09-19: 5 mg via ORAL

## 2024-09-19 MED ORDER — EPHEDRINE 5 MG/ML INJ
INTRAVENOUS | Status: AC
  Filled 2024-09-19: qty 5

## 2024-09-19 MED ORDER — HYDROMORPHONE HCL 1 MG/ML IJ SOLN
0.2500 mg | INTRAMUSCULAR | Status: DC | PRN
  Administered 2024-09-19 (×2): 0.25 mg via INTRAVENOUS
  Administered 2024-09-19: 0.5 mg via INTRAVENOUS

## 2024-09-19 MED ORDER — METHOCARBAMOL 500 MG PO TABS: 500.0000 mg | ORAL_TABLET | Freq: Three times a day (TID) | ORAL | 1 refills | Status: AC | PRN

## 2024-09-19 MED ORDER — HYDROMORPHONE HCL 1 MG/ML IJ SOLN
INTRAMUSCULAR | Status: AC
  Filled 2024-09-19: qty 1

## 2024-09-19 MED ORDER — PROPOFOL 10 MG/ML IV BOLUS
INTRAVENOUS | Status: DC | PRN
  Administered 2024-09-19: 140 mg via INTRAVENOUS

## 2024-09-19 MED ORDER — ORAL CARE MOUTH RINSE: 15.0000 mL | Freq: Once | OROMUCOSAL | Status: AC

## 2024-09-19 MED ORDER — POVIDONE-IODINE 7.5 % EX SOLN
Freq: Once | CUTANEOUS | Status: AC
  Filled 2024-09-19: qty 118

## 2024-09-19 MED ORDER — FENTANYL CITRATE (PF) 250 MCG/5ML IJ SOLN
INTRAMUSCULAR | Status: DC | PRN
  Administered 2024-09-19: 100 ug via INTRAVENOUS
  Administered 2024-09-19 (×2): 50 ug via INTRAVENOUS

## 2024-09-19 MED ORDER — OXYCODONE HCL 5 MG PO TABS: 5.0000 mg | ORAL_TABLET | ORAL | 0 refills | Status: AC | PRN

## 2024-09-19 MED ORDER — SUGAMMADEX SODIUM 200 MG/2ML IV SOLN
INTRAVENOUS | Status: DC | PRN
  Administered 2024-09-19 (×2): 100 mg via INTRAVENOUS

## 2024-09-19 MED ORDER — LIDOCAINE 2% (20 MG/ML) 5 ML SYRINGE
INTRAMUSCULAR | Status: DC | PRN
  Administered 2024-09-19: 80 mg via INTRAVENOUS

## 2024-09-19 MED ORDER — ACETAMINOPHEN 500 MG PO TABS
1000.0000 mg | ORAL_TABLET | Freq: Once | ORAL | Status: AC
  Administered 2024-09-19: 1000 mg via ORAL
  Filled 2024-09-19: qty 2

## 2024-09-19 MED ORDER — TRANEXAMIC ACID-NACL 1000-0.7 MG/100ML-% IV SOLN
1000.0000 mg | INTRAVENOUS | Status: AC
  Administered 2024-09-19: 1000 mg via INTRAVENOUS
  Filled 2024-09-19: qty 100

## 2024-09-19 MED ORDER — OXYCODONE HCL 5 MG PO TABS: 5.0000 mg | ORAL_TABLET | Freq: Once | ORAL | Status: AC | PRN

## 2024-09-19 MED ORDER — BUPIVACAINE LIPOSOME 1.3 % IJ SUSP
INTRAMUSCULAR | Status: AC
  Filled 2024-09-19: qty 10

## 2024-09-19 MED ORDER — 0.9 % SODIUM CHLORIDE (POUR BTL) OPTIME
TOPICAL | Status: DC | PRN
  Administered 2024-09-19: 1000 mL

## 2024-09-19 MED ORDER — MIDAZOLAM HCL 2 MG/2ML IJ SOLN
INTRAMUSCULAR | Status: AC
  Filled 2024-09-19: qty 2

## 2024-09-19 MED ORDER — OXYCODONE HCL 5 MG/5ML PO SOLN
ORAL | Status: AC
  Filled 2024-09-19: qty 5

## 2024-09-19 MED ORDER — DEXAMETHASONE SOD PHOSPHATE PF 10 MG/ML IJ SOLN
INTRAMUSCULAR | Status: DC | PRN
  Administered 2024-09-19: 10 mg via INTRAVENOUS

## 2024-09-19 MED ORDER — ROCURONIUM BROMIDE 10 MG/ML (PF) SYRINGE
PREFILLED_SYRINGE | INTRAVENOUS | Status: AC
  Filled 2024-09-19: qty 20

## 2024-09-19 MED ORDER — MIDAZOLAM HCL (PF) 2 MG/2ML IJ SOLN: 0.5000 mg | Freq: Once | INTRAMUSCULAR | Status: DC | PRN

## 2024-09-19 MED ORDER — HYDROMORPHONE HCL 1 MG/ML IJ SOLN
INTRAMUSCULAR | Status: AC
  Filled 2024-09-19: qty 0.5

## 2024-09-19 MED ORDER — ONDANSETRON HCL 4 MG/2ML IJ SOLN
INTRAMUSCULAR | Status: AC
  Filled 2024-09-19: qty 2

## 2024-09-19 MED ORDER — ONDANSETRON HCL 4 MG/2ML IJ SOLN
INTRAMUSCULAR | Status: DC | PRN
  Administered 2024-09-19: 4 mg via INTRAVENOUS

## 2024-09-19 MED ORDER — MEPERIDINE HCL 25 MG/ML IJ SOLN: 6.2500 mg | INTRAMUSCULAR | Status: DC | PRN

## 2024-09-19 MED ORDER — BUPIVACAINE-EPINEPHRINE (PF) 0.5% -1:200000 IJ SOLN
INTRAMUSCULAR | Status: DC | PRN
  Administered 2024-09-19: 10 mL via PERINEURAL

## 2024-09-19 MED ORDER — LIDOCAINE 2% (20 MG/ML) 5 ML SYRINGE
INTRAMUSCULAR | Status: AC
  Filled 2024-09-19: qty 5

## 2024-09-19 MED ORDER — EPHEDRINE SULFATE-NACL 50-0.9 MG/10ML-% IV SOSY
PREFILLED_SYRINGE | INTRAVENOUS | Status: DC | PRN
  Administered 2024-09-19: 5 mg via INTRAVENOUS

## 2024-09-19 MED ORDER — ROCURONIUM BROMIDE 10 MG/ML (PF) SYRINGE
PREFILLED_SYRINGE | INTRAVENOUS | Status: DC | PRN
  Administered 2024-09-19: 50 mg via INTRAVENOUS

## 2024-09-19 MED ORDER — BUPIVACAINE LIPOSOME 1.3 % IJ SUSP
INTRAMUSCULAR | Status: DC | PRN
  Administered 2024-09-19: 10 mL via PERINEURAL

## 2024-09-19 MED ORDER — LACTATED RINGERS IV SOLN: INTRAVENOUS | Status: DC

## 2024-09-19 MED ORDER — PROPOFOL 10 MG/ML IV BOLUS
INTRAVENOUS | Status: AC
  Filled 2024-09-19: qty 20

## 2024-09-19 MED ORDER — CHLORHEXIDINE GLUCONATE 0.12 % MT SOLN
15.0000 mL | Freq: Once | OROMUCOSAL | Status: AC
  Administered 2024-09-19: 15 mL via OROMUCOSAL
  Filled 2024-09-19: qty 15

## 2024-09-19 MED ORDER — KETOROLAC TROMETHAMINE 10 MG PO TABS: 10.0000 mg | ORAL_TABLET | Freq: Three times a day (TID) | ORAL | 0 refills | Status: AC | PRN

## 2024-09-19 MED ORDER — BUPIVACAINE HCL (PF) 0.25 % IJ SOLN
INTRAMUSCULAR | Status: AC
  Filled 2024-09-19: qty 30

## 2024-09-19 MED ORDER — FENTANYL CITRATE (PF) 100 MCG/2ML IJ SOLN
INTRAMUSCULAR | Status: AC
  Filled 2024-09-19: qty 2

## 2024-09-19 SURGICAL SUPPLY — 1 items: NDL 22X1.5 STRL (OR ONLY) (MISCELLANEOUS) IMPLANT

## 2024-09-19 NOTE — Anesthesia Procedure Notes (Signed)
 Procedure Name: Intubation Date/Time: 09/19/2024 4:05 PM  Performed by: Emmitt Millman, CRNAPre-anesthesia Checklist: Patient identified, Emergency Drugs available, Suction available and Patient being monitored Patient Re-evaluated:Patient Re-evaluated prior to induction Oxygen Delivery Method: Circle system utilized Preoxygenation: Pre-oxygenation with 100% oxygen Induction Type: IV induction Ventilation: Mask ventilation without difficulty Laryngoscope Size: Mac and 4 Grade View: Grade I Tube type: Oral Tube size: 7.0 mm Number of attempts: 1 Airway Equipment and Method: Stylet Placement Confirmation: ETT inserted through vocal cords under direct vision, positive ETCO2 and breath sounds checked- equal and bilateral Secured at: 24 cm Tube secured with: Tape Dental Injury: Teeth and Oropharynx as per pre-operative assessment

## 2024-09-19 NOTE — Anesthesia Procedure Notes (Signed)
 Anesthesia Regional Block: Interscalene brachial plexus block   Pre-Anesthetic Checklist: , timeout performed,  Correct Patient, Correct Site, Correct Laterality,  Correct Procedure, Correct Position, site marked,  Risks and benefits discussed,  Surgical consent,  Pre-op evaluation,  At surgeon's request and post-op pain management  Laterality: Left and Upper  Prep: chloraprep       Needles:  Injection technique: Single-shot  Needle Type: Echogenic Needle     Needle Length: 9cm  Needle Gauge: 21     Additional Needles:   Procedures:,,,, ultrasound used (permanent image in chart),,    Narrative:  Start time: 09/19/2024 3:36 PM End time: 09/19/2024 3:44 PM Injection made incrementally with aspirations every 5 mL.  Performed by: Personally  Anesthesiologist: Devin Athens, MD  Additional Notes: Pt identified in Holding room.  Monitors applied. Working IV access confirmed. Timeout, Sterile prep L clavicle and neck.  #21ga ECHOgenic Arrow block needle to interscalene brachial plexus with US  guidance.  10cc 0.5% Bupivacaine  1:200k epi, Exparel  injected incrementally after negative test dose.  Patient asymptomatic, VSS, no heme aspirated, tolerated well.   Devin Ryan Leonce, MD

## 2024-09-19 NOTE — H&P (Signed)
 Devin Ryan is an 58 y.o. male.   Chief Complaint: Left arm pain HPI: Devin Ryan is an active 58 year old right-hand-dominant patient who is doing some lifting on Monday.  Felt a pop in his left elbow.  Has had pain weakness and swelling since that time.  Of note is the patient did have partial tear of his right distal biceps which has healed but remains somewhat symptomatic.  Patient describes weakness as well as pain with full extension on the left-hand side.  Ultrasound examination does confirm laxity and detachment of the distal biceps tendon.  Past Medical History:  Diagnosis Date   Anxiety    pt denies any current issues   Diverticulitis    Eczema 11/30/2018   Headache 11/30/2018   Hyperlipidemia    Low testosterone     Vitamin D  insufficiency     Past Surgical History:  Procedure Laterality Date   BOWEL RESECTION Left 2017   COLONOSCOPY WITH PROPOFOL  N/A 06/20/2022   Procedure: COLONOSCOPY WITH PROPOFOL ;  Surgeon: Cindie Carlin POUR, DO;  [facility];  Service: Endoscopy;  Laterality: N/A;  8:00am, asa 2   NOSE SURGERY     PARTIAL COLECTOMY N/A 09/28/2015   Procedure: PARTIAL COLECTOMY;  Surgeon: Oneil Budge, MD;  Location: [facility];  Service: General;  Laterality: N/A;   POLYPECTOMY  06/20/2022   Procedure: POLYPECTOMY;  Surgeon: Cindie Carlin POUR, DO;  [facility];  Service: Endoscopy;;    Family History  Problem Relation Age of Onset   Diabetes Mother    Heart disease Father        Heart attack   Heart attack Father 6   Social History:  reports that he has been smoking cigarettes. He has a 15 pack-year smoking history. He has never used smokeless tobacco. He reports current alcohol use of about 4.0 standard drinks of alcohol per week. He reports that he does not use drugs.  Allergies: Allergies[1]  Medications Prior to Admission  Medication Sig Dispense Refill   Cholecalciferol (VITAMIN D3) 125 MCG (5000 UT) TABS Take 5,000 Units by mouth daily  with breakfast.     ibuprofen (ADVIL,MOTRIN) 200 MG tablet Take 800 mg by mouth every 6 (six) hours as needed for moderate pain.     MAGNESIUM GLYCINATE PO Take 1 tablet by mouth daily.     Red Yeast Rice Extract (RED YEAST RICE PO) Take 1 capsule by mouth daily.     vitamin E 180 MG (400 UNITS) capsule Take 400 Units by mouth daily.     Zinc 50 MG TABS Take 1 tablet by mouth daily.     albuterol  (VENTOLIN  HFA) 108 (90 Base) MCG/ACT inhaler INHALE 1 PUFF INTO THE LUNGS EVERY 6 HOURS AS NEEDED FOR SHORTNESS OF BREATH 18 g 11   cetirizine  (ZYRTEC ) 10 MG tablet Take 1 tablet (10 mg total) by mouth daily. (Patient not taking: Reported on 09/18/2024) 30 tablet 11   Chlorphen-PE-Acetaminophen  (NOREL AD) 4-10-325 MG TABS Take 1 tablet by mouth 2 (two) times daily as needed. (Patient not taking: Reported on 09/18/2024) 60 tablet 0   fluticasone  (FLONASE ) 50 MCG/ACT nasal spray USE 2 SPRAYS IN EACH NOSTRIL ONCE DAILY (Patient not taking: Reported on 09/18/2024) 16 g 6    Results for orders placed or performed during the hospital encounter of 09/19/24 (from the past 48 hours)  CBC     Status: None   Collection Time: 09/19/24 12:45 PM  Result Value Ref Range   WBC 10.4  4.0 - 10.5 K/uL   RBC 5.07 4.22 - 5.81 MIL/uL   Hemoglobin 16.6 13.0 - 17.0 g/dL   HCT 53.2 60.9 - 47.9 %   MCV 92.1 80.0 - 100.0 fL   MCH 32.7 26.0 - 34.0 pg   MCHC 35.5 30.0 - 36.0 g/dL   RDW 88.1 88.4 - 84.4 %   Platelets 237 150 - 400 K/uL   nRBC 0.0 0.0 - 0.2 %    Comment: Performed at [facility], [address], [place], [place] [address]  Basic metabolic panel     Status: None   Collection Time: 09/19/24 12:45 PM  Result Value Ref Range   Sodium 137 135 - 145 mmol/L   Potassium 3.9 3.5 - 5.1 mmol/L   Chloride 102 98 - 111 mmol/L   CO2 24 22 - 32 mmol/L   Glucose, Bld 89 70 - 99 mg/dL    Comment: Glucose reference range applies only to samples taken after fasting for at least 8 hours.   BUN 11 6 - 20 mg/dL    Creatinine, Ser 9.17 0.61 - 1.24 mg/dL   Calcium  9.2 8.9 - 10.3 mg/dL   GFR, Estimated >39 >39 mL/min    Comment: (NOTE) Calculated using the CKD-EPI Creatinine Equation (2021)    Anion gap 11 5 - 15    Comment: Performed at [facility], [address], [place], [place] [address]   No results found.  Review of Systems  Musculoskeletal:  Positive for arthralgias.  All other systems reviewed and are negative.   Blood pressure 138/88, pulse 68, temperature 98.4 F (36.9 C), temperature source Oral, resp. rate 20, height 5' 11 (1.803 m), weight 88.5 kg, SpO2 95%. Physical Exam Vitals reviewed.  HENT:     Head: Normocephalic.     Nose: Nose normal.     Mouth/Throat:     Mouth: Mucous membranes are moist.  Eyes:     Pupils: Pupils are equal, round, and reactive to light.  Cardiovascular:     Rate and Rhythm: Normal rate.     Pulses: Normal pulses.  Pulmonary:     Effort: Pulmonary effort is normal.  Abdominal:     General: Abdomen is flat.  Musculoskeletal:     Cervical back: Normal range of motion.  Skin:    General: Skin is warm.     Capillary Refill: Capillary refill takes less than 2 seconds.  Neurological:     General: No focal deficit present.     Mental Status: He is alert.  Psychiatric:        Mood and Affect: Mood normal.   Ortho exam demonstrates palpable radial pulse on that left-hand side.  Patient does have swelling about the proximal forearm.  Compartments are soft.  EPL FPL interosseous function is intact.  Patient does not have a palpable distal biceps tendon and the muscle contour is different left versus right with more proximal contour on the left-hand side.  Patient has about 50% less supination strength on the left compared to the right.  Assessment/Plan Impression is right distal biceps rupture.  Patient has proximal migration of the biceps along with no definite palpable tendon within the elbow flexion crease as well as weakness with  supination.  Ultrasound examination confirms laxity of the biceps tendon as well as detachment.  Injury is about 61 days old.  Plan is operative distal biceps tendon reattachment.  The risk and benefits are discussed with the patient including not  limited to infection nerve vessel damage heterotopic ossification elbow stiffness as well as potential need for further surgery.  Patient understands.  All questions answered.  Plan to use anti-inflammatory for the first week postop to diminish chances for heterotopic ossification.  KANDICE Glendia Hutchinson, MD 09/19/2024, 3:30 PM       [1] No Known Allergies

## 2024-09-19 NOTE — Brief Op Note (Signed)
° °  09/19/2024  6:03 PM  PATIENT:  Butler JONELLE Rung  58 y.o. male  PRE-OPERATIVE DIAGNOSIS:  left distal biceps rupture  POST-OPERATIVE DIAGNOSIS:  left distal biceps rupture  PROCEDURE:  Procedures: Left distal bicep tendon repair  SURGEON:  Surgeon(s): Addie Cordella Hamilton, MD  ASSISTANT: magnant pa  ANESTHESIA:   general  EBL: 25 ml    Total I/O In: 200 [IV Piggyback:200] Out: -   BLOOD ADMINISTERED: none  DRAINS: none   LOCAL MEDICATIONS USED:  none  SPECIMEN:  No Specimen  COUNTS:  YES  TOURNIQUET:   Total Tourniquet Time Documented: Upper Arm (Left) - 58 minutes Total: Upper Arm (Left) - 58 minutes   DICTATION: .Other Dictation: Dictation Number [id number]  PLAN OF CARE: Discharge to home after PACU  PATIENT DISPOSITION:  PACU - hemodynamically stable

## 2024-09-19 NOTE — Anesthesia Preprocedure Evaluation (Addendum)
 Anesthesia Evaluation  Patient identified by MRN, date of birth, ID band Patient awake    Reviewed: Allergy & Precautions, NPO status , Patient's Chart, lab work & pertinent test results  History of Anesthesia Complications Negative for: history of anesthetic complications  Airway Mallampati: II  TM Distance: >3 FB Neck ROM: Full    Dental  (+) Missing, Dental Advisory Given, Partial Upper   Pulmonary COPD,  COPD inhaler, Current Smoker and Patient abstained from smoking.   breath sounds clear to auscultation       Cardiovascular negative cardio ROS  Rhythm:Regular Rate:Normal     Neuro/Psych  Headaches  Anxiety        GI/Hepatic negative GI ROS, Neg liver ROS,,,  Endo/Other  negative endocrine ROS    Renal/GU negative Renal ROS     Musculoskeletal   Abdominal   Peds  Hematology Hb 16.6, plt 237k   Anesthesia Other Findings   Reproductive/Obstetrics                              Anesthesia Physical Anesthesia Plan  ASA: 2  Anesthesia Plan: General   Post-op Pain Management: Regional block* and Tylenol  PO (pre-op)*   Induction: Intravenous  PONV Risk Score and Plan: 1 and Ondansetron  and Dexamethasone   Airway Management Planned: Oral ETT  Additional Equipment: None  Intra-op Plan:   Post-operative Plan: Extubation in OR  Informed Consent: I have reviewed the patients History and Physical, chart, labs and discussed the procedure including the risks, benefits and alternatives for the proposed anesthesia with the patient or authorized representative who has indicated his/her understanding and acceptance.     Dental advisory given  Plan Discussed with: CRNA and Surgeon  Anesthesia Plan Comments: (Plan routine monitors, GETA with interscalene block for post op analgesia)         Anesthesia Quick Evaluation

## 2024-09-19 NOTE — Op Note (Signed)
 NAMEBETZALEL, Devin Ryan MEDICAL RECORD NO: [id number] ACCOUNT NO: [id number] DATE OF BIRTH: 12/22/1965 FACILITY: [facility] LOCATION: [facility] PHYSICIAN: Cordella RAMAN. Addie, MD  Operative Report   PREOPERATIVE DIAGNOSIS:  Left distal biceps tendon rupture.  POSTOPERATIVE DIAGNOSIS:  Left distal biceps tendon rupture.  PROCEDURE:  Left distal biceps tendon rupture repair.  SURGEON:  Cordella RAMAN. Addie, MD.  ASSISTANT:  Herlene Calix, PA.  INDICATIONS:  The patient is an active 58 year old patient who sustained distal biceps tendon rupture 3 days ago.  He presents for operative management after explanation of the risks and benefits.  DESCRIPTION OF PROCEDURE:  The patient was brought to the operating room where general anesthetic was induced.  Preoperative antibiotics were administered.  Time out was called.  The left arm was prescrubbed with alcohol and Betadine , allowed to air dry,  prepped with DuraPrep solution, and draped in a sterile manner.  A sterile tourniquet was utilized.  Ioban was used to cover the operative field.  After calling time out, the arm was elevated and exsanguinated with an Esmarch wrap.  The tourniquet was  inflated.  Total tourniquet time was 58 minutes at 250 of mercury.  The radial tuberosity was localized under mini C-arm fluoroscopy.  Incision was then made from that radial tuberosity to the elbow flexion crease and then about 3 cm distally.  Skin and  subcutaneous tissues were sharply divided.  Crossing veins were preserved when possible.  Branches were ligated using silk ligatures.  The biceps tendon was not immediately visible near the elbow flexion crease.  The incision was then extended in  S-shaped fashion proximally and the biceps tendon was visualized.  The lateral antebrachial cutaneous nerve was also visualized along with its branches and it was protected.  The tendon stump was delivered into the incision.  It was then prepared for  EndoButton technique fixation by  placing grasping sutures through the tendon and then attaching an EndoButton.  Next, the plane between the mobile wad and the flexor pronator mass was developed with crossing vessels ligated using silk ligature.  The  radial tuberosity was then visualized.  The tendon stump was 7 mm in diameter.  With the arm maximally supinated and with a blue handle retractor on the lateral side and a Cobb on the ulnar side, the guide pin was placed.  Thorough copious irrigation was  utilized to prevent any bone fragment creation.  That was suctioned out.  Then, we used the 7 mm reamer to drill a unicortical hole in the tuberosity.  The tendon was then delivered and the EndoButton was flipped, which was confirmed under fluoroscopy.   About a centimeter and a half of the tendon was then bottomed out into the proximal radius.  Secure fixation was achieved.  The lateral antebrachial cutaneous nerve and its branches were preserved.  Thorough irrigation was then performed.  During the  reaming, we also used copious irrigation.  Next, the tourniquet was released and bleeding points encountered were controlled using electrocautery.  The skin was then closed using 2-0 Vicryl suture and 3-0 Monocryl.  A well-padded posterior splint was  applied.  The patient tolerated the procedure well without immediate complications and was transferred to the recovery room.  Luke's assistance was required at all times for retraction, opening, closing, and mobilization of tissue.  His assistance was a  medical necessity.    NIK D: 09/19/2024 6:08:12 pm T: 09/19/2024 10:04:00 pm  JOB: [id number]/ [id number]

## 2024-09-19 NOTE — Transfer of Care (Signed)
 Immediate Anesthesia Transfer of Care Note  Patient: Devin Ryan  Procedure(s) Performed: Left distal bicep tendon repair (Left: Arm Upper)  Patient Location: PACU  Anesthesia Type:General  Level of Consciousness: awake  Airway & Oxygen Therapy: Patient Spontanous Breathing and Patient connected to nasal cannula oxygen  Post-op Assessment: Report given to RN and Post -op Vital signs reviewed and stable  Post vital signs: Reviewed and stable  Last Vitals:  Vitals Value Taken Time  BP 141/89 09/19/24 18:09  Temp 36.5 C 09/19/24 18:09  Pulse 77 09/19/24 18:11  Resp 12 09/19/24 18:11  SpO2 95 % 09/19/24 18:11  Vitals shown include unfiled device data.  Last Pain:  Vitals:   09/19/24 1304  TempSrc:   PainSc: 0-No pain         Complications: No notable events documented.

## 2024-09-20 ENCOUNTER — Encounter (HOSPITAL_COMMUNITY): Payer: Self-pay | Admitting: Orthopedic Surgery

## 2024-09-20 NOTE — Anesthesia Postprocedure Evaluation (Signed)
 Anesthesia Post Note  Patient: Devin Ryan  Procedure(s) Performed: Left distal bicep tendon repair (Left: Arm Upper)     Patient location during evaluation: PACU Anesthesia Type: General Level of consciousness: awake and alert Pain management: pain level controlled Vital Signs Assessment: post-procedure vital signs reviewed and stable Respiratory status: spontaneous breathing, nonlabored ventilation and respiratory function stable Cardiovascular status: blood pressure returned to baseline and stable Postop Assessment: no apparent nausea or vomiting Anesthetic complications: no   No notable events documented.  Last Vitals:  Vitals:   09/19/24 1900 09/19/24 1915  BP: 124/83 125/85  Pulse: 75 71  Resp: (!) 24 17  Temp:  36.5 C  SpO2: 93% 94%    Last Pain:  Vitals:   09/19/24 1915  TempSrc:   PainSc: 3                  Garnette FORBES Skillern

## 2024-09-23 ENCOUNTER — Encounter: Payer: Self-pay | Admitting: Orthopedic Surgery

## 2024-09-24 ENCOUNTER — Ambulatory Visit: Admitting: Sports Medicine

## 2024-09-24 ENCOUNTER — Other Ambulatory Visit

## 2024-09-27 ENCOUNTER — Ambulatory Visit: Admitting: Sports Medicine

## 2024-09-27 ENCOUNTER — Ambulatory Visit: Admitting: Surgical

## 2024-09-27 ENCOUNTER — Encounter: Payer: Self-pay | Admitting: Surgical

## 2024-09-27 DIAGNOSIS — S46212A Strain of muscle, fascia and tendon of other parts of biceps, left arm, initial encounter: Secondary | ICD-10-CM

## 2024-09-27 NOTE — Progress Notes (Signed)
 "  Post-Op Visit Note   Patient: Devin Ryan           Date of Birth: 09/10/1966           MRN: [id number] Visit Date: 09/27/2024 PCP: Joesph Annabella HERO, FNP   Assessment & Plan:  Chief Complaint:  Chief Complaint  Patient presents with   Left Elbow - Routine Post Op    09/19/24 left distal biceps rupture repair   Visit Diagnoses: No diagnosis found.  Plan: Patient is a 58 year old male who presents s/p left distal bicep rupture repair on 09/19/2024.  Doing well.  Minimal pain.  Has not taken any opioid pain medication since Sunday.  Feels amazing.  No numbness or tingling or significant swelling.  Has used sling and postop splint.  Is compliant with taking anti-inflammatory medication every day.  No fevers or chills.  Has not been doing any lifting.  On exam, patient has intact EPL, FPL, finger abduction, grip strength, pronation/supination.  Active bicep flexion tricep extension intact.  Bicep contour appears similar to time of surgery.  Incision looks to be healing well without evidence of infection or dehiscence.  Palpable radial pulse rated 2+.  Plan at this time is continue with sling immobilization for the next week.  He is okay for 90 degrees of elbow flexion to full elbow flexion and full pronation and supination actively.  Continue with anti-inflammatories to prevent heterotopic ossification.  1 week from today, patient can extend to a maximum of 45 degrees of elbow flexion.  No lifting the objects with the operative arm.  Follow-up on 12/29.  Follow-Up Instructions: No follow-ups on file.   Orders:  No orders of the defined types were placed in this encounter.  No orders of the defined types were placed in this encounter.   Imaging: No results found.  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   Tobacco use  09/03/2015   Past Medical History:  Diagnosis Date   Anxiety    pt denies any current issues   Diverticulitis    Eczema 11/30/2018   Headache 11/30/2018   Hyperlipidemia    Low testosterone     Vitamin D  insufficiency     Family History  Problem Relation Age of Onset   Diabetes Mother    Heart disease Father        Heart attack   Heart attack Father 61    Past Surgical History:  Procedure Laterality Date   BOWEL RESECTION Left 2017   COLONOSCOPY WITH PROPOFOL  N/A 06/20/2022   Procedure: COLONOSCOPY WITH PROPOFOL ;  Surgeon: Cindie Carlin POUR, DO;  [facility];  Service: Endoscopy;  Laterality: N/A;  8:00am, asa 2   DISTAL BICEPS TENDON REPAIR Left 09/19/2024   Procedure: Left distal bicep tendon repair;  Surgeon: Addie Cordella Hamilton, MD;  Location: [facility] OR;  Service: Orthopedics;  Laterality: Left;  left distal biceps rupture repair   NOSE SURGERY     PARTIAL COLECTOMY N/A 09/28/2015   Procedure: PARTIAL COLECTOMY;  Surgeon: Oneil Budge, MD;  Location: [facility];  Service: General;  Laterality: N/A;   POLYPECTOMY  06/20/2022   Procedure: POLYPECTOMY;  Surgeon: Cindie Carlin POUR, DO;  [facility];  Service: Endoscopy;;   Social History   Occupational History   Not on file  Tobacco Use   Smoking status:  Every Day    Current packs/day: 0.50    Average packs/day: 0.5 packs/day for 30.0 years (15.0 ttl pk-yrs)    Types: Cigarettes   Smokeless tobacco: Never  Vaping Use   Vaping status: Never Used  Substance and Sexual Activity   Alcohol use: Yes    Alcohol/week: 4.0 standard drinks of alcohol    Types: 4 Cans of beer per week    Comment: occ   Drug use: No   Sexual activity: Yes    Birth control/protection: None     "

## 2024-09-29 DIAGNOSIS — S46212D Strain of muscle, fascia and tendon of other parts of biceps, left arm, subsequent encounter: Secondary | ICD-10-CM

## 2024-10-07 ENCOUNTER — Ambulatory Visit: Admitting: Surgical

## 2024-10-07 ENCOUNTER — Encounter: Payer: Self-pay | Admitting: Surgical

## 2024-10-07 DIAGNOSIS — S46212A Strain of muscle, fascia and tendon of other parts of biceps, left arm, initial encounter: Secondary | ICD-10-CM

## 2024-10-07 NOTE — Progress Notes (Signed)
 "  Post-Op Visit Note   Patient: Devin Ryan           Date of Birth: 10-24-1965           MRN: [id number] Visit Date: 10/07/2024 PCP: Joesph Annabella HERO, FNP   Assessment & Plan:  Chief Complaint:  Chief Complaint  Patient presents with   Left Elbow - Routine Post Op, Follow-up    09/19/24 left distal biceps rupture repair   Visit Diagnoses: No diagnosis found.  Plan: Patient is a 58 year old male who presents s/p left distal bicep rupture repair on 09/19/2024.  Doing very well with really no pain.  Has been keeping in the sling and doing some flexion and pronation/supination exercises without weight.  Not requiring any pain medication.  He is taking NSAIDs consistently to prevent heterotopic ossification.  On exam, patient has intact EPL, FPL, finger abduction, grip strength, pronation/supination.  Active bicep flexion tricep extension intact.  Bicep contour appears similar to last exam.  Incision looks to be healing well without evidence of infection or dehiscence; Steri-Strips removed..  Palpable radial pulse rated 2+.  Plan at this time is continue with intermittent sling immobilization.  Okay to extend to a maximum of 45 degrees of elbow flexion.  Still no lifting or using weight in any way for his rehab but I would continue with the continued flexion/pronation/supination exercises that he is doing.  Follow-up in 2 weeks for clinical recheck with Dr. Addie and consideration of increasing to full extension at that point.  Follow-Up Instructions: No follow-ups on file.   Orders:  No orders of the defined types were placed in this encounter.  No orders of the defined types were placed in this encounter.   Imaging: No results found.  PMFS History: Patient Active Problem List   Diagnosis Date Noted   Biceps rupture, distal, left, subsequent encounter 09/29/2024   Vitamin D  deficiency 06/03/2022   Mixed hyperlipidemia 06/03/2022   Eczema 11/30/2018   Male sexual dysfunction  09/18/2018   Tobacco use 09/03/2015   Past Medical History:  Diagnosis Date   Anxiety    pt denies any current issues   Diverticulitis    Eczema 11/30/2018   Headache 11/30/2018   Hyperlipidemia    Low testosterone     Vitamin D  insufficiency     Family History  Problem Relation Age of Onset   Diabetes Mother    Heart disease Father        Heart attack   Heart attack Father 50    Past Surgical History:  Procedure Laterality Date   BOWEL RESECTION Left 2017   COLONOSCOPY WITH PROPOFOL  N/A 06/20/2022   Procedure: COLONOSCOPY WITH PROPOFOL ;  Surgeon: Cindie Carlin POUR, DO;  Location: [facility];  Service: Endoscopy;  Laterality: N/A;  8:00am, asa 2   DISTAL BICEPS TENDON REPAIR Left 09/19/2024   Procedure: Left distal bicep tendon repair;  Surgeon: Addie Cordella Hamilton, MD;  Location: [facility] OR;  Service: Orthopedics;  Laterality: Left;  left distal biceps rupture repair   NOSE SURGERY     PARTIAL COLECTOMY N/A 09/28/2015   Procedure: PARTIAL COLECTOMY;  Surgeon: Oneil Budge, MD;  Location: [facility];  Service: General;  Laterality: N/A;   POLYPECTOMY  06/20/2022   Procedure: POLYPECTOMY;  Surgeon: Cindie Carlin POUR, DO;  Location: [facility];  Service: Endoscopy;;   Social History   Occupational History   Not on file  Tobacco Use   Smoking status: Every Day  Current packs/day: 0.50    Average packs/day: 0.5 packs/day for 30.0 years (15.0 ttl pk-yrs)    Types: Cigarettes   Smokeless tobacco: Never  Vaping Use   Vaping status: Never Used  Substance and Sexual Activity   Alcohol use: Yes    Alcohol/week: 4.0 standard drinks of alcohol    Types: 4 Cans of beer per week    Comment: occ   Drug use: No   Sexual activity: Yes    Birth control/protection: None     "

## 2024-10-23 ENCOUNTER — Ambulatory Visit: Admitting: Orthopedic Surgery

## 2024-10-23 DIAGNOSIS — S46212A Strain of muscle, fascia and tendon of other parts of biceps, left arm, initial encounter: Secondary | ICD-10-CM

## 2024-10-23 NOTE — Progress Notes (Signed)
 "  Post-Op Visit Note   Patient: Devin Ryan           Date of Birth: 09/12/1966           MRN: [id number] Visit Date: 10/23/2024 PCP: Joesph Annabella HERO, FNP   Assessment & Plan:  Chief Complaint:  Chief Complaint  Patient presents with   Left Elbow - Routine Post Op     09/19/24 left distal biceps rupture repair     Visit Diagnoses:  1. Rupture of left distal biceps tendon, initial encounter     Plan: [name] is now a month out from left distal biceps rupture repair.  He has been doing his own physical therapy.  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  On exam he has almost full range of motion with supination strength 5- out of 5 on the left 5+ out of 5 on the right.  Biceps tendon is palpable and intact.  Plan at this time is to continue with primarily stretching exercises as well as activities of daily living.  No lifting with that left arm more than 5 pounds.  4-week return for clinical recheck.  Follow-Up Instructions: No follow-ups on file.   Orders:  No orders of the defined types were placed in this encounter.  No orders of the defined types were placed in this encounter.   Imaging: No results found.  PMFS History: Patient Active Problem List   Diagnosis Date Noted   Biceps rupture, distal, left, subsequent encounter 09/29/2024   Vitamin D  deficiency 06/03/2022   Mixed hyperlipidemia 06/03/2022   Eczema 11/30/2018   Male sexual dysfunction 09/18/2018   Tobacco use 09/03/2015   Past Medical History:  Diagnosis Date   Anxiety    pt denies any current issues   Diverticulitis    Eczema 11/30/2018   Headache 11/30/2018   Hyperlipidemia    Low testosterone     Vitamin D  insufficiency     Family History  Problem Relation Age of Onset   Diabetes Mother    Heart disease Father        Heart attack   Heart attack Father 49    Past Surgical History:  Procedure Laterality Date   BOWEL RESECTION Left 2017   COLONOSCOPY WITH  PROPOFOL  N/A 06/20/2022   Procedure: COLONOSCOPY WITH PROPOFOL ;  Surgeon: Cindie Carlin POUR, DO;  Location: [facility];  Service: Endoscopy;  Laterality: N/A;  8:00am, asa 2   DISTAL BICEPS TENDON REPAIR Left 09/19/2024   Procedure: Left distal bicep tendon repair;  Surgeon: Addie Cordella Hamilton, MD;  Location: [facility] OR;  Service: Orthopedics;  Laterality: Left;  left distal biceps rupture repair   NOSE SURGERY     PARTIAL COLECTOMY N/A 09/28/2015   Procedure: PARTIAL COLECTOMY;  Surgeon: Oneil Budge, MD;  Location: [facility];  Service: General;  Laterality: N/A;   POLYPECTOMY  06/20/2022   Procedure: POLYPECTOMY;  Surgeon: Cindie Carlin POUR, DO;  Location: [facility];  Service: Endoscopy;;   Social History   Occupational History   Not on file  Tobacco Use   Smoking status: Every Day    Current packs/day: 0.50    Average packs/day: 0.5 packs/day for 30.0 years (15.0 ttl pk-yrs)    Types: Cigarettes   Smokeless tobacco: Never  Vaping Use   Vaping status: Never Used  Substance and Sexual Activity   Alcohol use: Yes    Alcohol/week: 4.0 standard drinks of alcohol    Types: 4  Cans of beer per week    Comment: occ   Drug use: No   Sexual activity: Yes    Birth control/protection: None     "

## 2024-10-24 ENCOUNTER — Encounter: Payer: Self-pay | Admitting: Family Medicine

## 2024-10-24 DIAGNOSIS — K649 Unspecified hemorrhoids: Secondary | ICD-10-CM

## 2024-10-24 MED ORDER — HYDROCORTISONE 2.5 % EX CREA: TOPICAL_CREAM | Freq: Two times a day (BID) | CUTANEOUS | 0 refills | Status: AC

## 2024-10-27 ENCOUNTER — Encounter: Payer: Self-pay | Admitting: Orthopedic Surgery

## 2024-11-07 ENCOUNTER — Telehealth: Payer: Self-pay

## 2024-11-07 NOTE — Telephone Encounter (Signed)
 Request for more documentation submitted to [facility] faxed [phone] Last 3 OV notes sent

## 2024-11-20 ENCOUNTER — Encounter: Admitting: Orthopedic Surgery

## 2025-07-30 ENCOUNTER — Encounter: Payer: Self-pay | Admitting: Family Medicine
